# Patient Record
Sex: Female | Born: 1985
Health system: Southern US, Community
[De-identification: ages and names within clinical notes are randomized; demographics above are authoritative.]

## PROBLEM LIST (undated history)

## (undated) DIAGNOSIS — N809 Endometriosis, unspecified: Secondary | ICD-10-CM

## (undated) DIAGNOSIS — F32A Depression, unspecified: Secondary | ICD-10-CM

## (undated) DIAGNOSIS — F329 Major depressive disorder, single episode, unspecified: Secondary | ICD-10-CM

## (undated) DIAGNOSIS — K900 Celiac disease: Secondary | ICD-10-CM

## (undated) DIAGNOSIS — J45909 Unspecified asthma, uncomplicated: Secondary | ICD-10-CM

---

## 2014-09-28 HISTORY — PX: IVC FILTER INSERTION: CATH118245

## 2014-09-28 HISTORY — PX: GASTRIC BYPASS OPEN: SUR638

## 2018-04-28 ENCOUNTER — Emergency Department (HOSPITAL_COMMUNITY): Payer: Self-pay

## 2018-04-28 ENCOUNTER — Emergency Department (HOSPITAL_COMMUNITY)
Admission: EM | Admit: 2018-04-28 | Discharge: 2018-04-28 | Disposition: A | Discharge status: Home / Self Care | Payer: Self-pay | Attending: Emergency Medicine | Admitting: Emergency Medicine

## 2018-04-28 ENCOUNTER — Other Ambulatory Visit: Payer: Self-pay

## 2018-04-28 ENCOUNTER — Encounter (HOSPITAL_COMMUNITY): Payer: Self-pay

## 2018-04-28 DIAGNOSIS — F329 Major depressive disorder, single episode, unspecified: Secondary | ICD-10-CM | POA: Insufficient documentation

## 2018-04-28 DIAGNOSIS — M545 Low back pain, unspecified: Secondary | ICD-10-CM

## 2018-04-28 DIAGNOSIS — Z79899 Other long term (current) drug therapy: Secondary | ICD-10-CM | POA: Insufficient documentation

## 2018-04-28 DIAGNOSIS — F17200 Nicotine dependence, unspecified, uncomplicated: Secondary | ICD-10-CM | POA: Insufficient documentation

## 2018-04-28 DIAGNOSIS — G8929 Other chronic pain: Secondary | ICD-10-CM | POA: Insufficient documentation

## 2018-04-28 DIAGNOSIS — Z9884 Bariatric surgery status: Secondary | ICD-10-CM | POA: Insufficient documentation

## 2018-04-28 DIAGNOSIS — J45909 Unspecified asthma, uncomplicated: Secondary | ICD-10-CM | POA: Insufficient documentation

## 2018-04-28 HISTORY — DX: Celiac disease: K90.0

## 2018-04-28 HISTORY — DX: Endometriosis, unspecified: N80.9

## 2018-04-28 HISTORY — DX: Unspecified asthma, uncomplicated: J45.909

## 2018-04-28 HISTORY — DX: Depression, unspecified: F32.A

## 2018-04-28 HISTORY — DX: Major depressive disorder, single episode, unspecified: F32.9

## 2018-04-28 LAB — PREGNANCY, URINE: Preg Test, Ur: NEGATIVE

## 2018-04-28 MED ORDER — OXYCODONE-ACETAMINOPHEN 5-325 MG PO TABS
1.0000 | ORAL_TABLET | Freq: Once | ORAL | Status: AC
  Administered 2018-04-28: 1 via ORAL
  Filled 2018-04-28: qty 1

## 2018-04-28 MED ORDER — PREDNISONE 20 MG PO TABS: 40.0000 mg | ORAL_TABLET | Freq: Every day | ORAL | 0 refills | Status: AC

## 2018-04-28 MED ORDER — HYDROCODONE-ACETAMINOPHEN 5-325 MG PO TABS: 1.0000 | ORAL_TABLET | Freq: Four times a day (QID) | ORAL | 0 refills | Status: DC | PRN

## 2018-04-28 MED ORDER — PREDNISONE 50 MG PO TABS
50.0000 mg | ORAL_TABLET | Freq: Once | ORAL | Status: AC
  Administered 2018-04-28: 50 mg via ORAL
  Filled 2018-04-28: qty 1

## 2018-04-28 MED ORDER — TIZANIDINE HCL 4 MG PO TABS: 4.0000 mg | ORAL_TABLET | Freq: Three times a day (TID) | ORAL | 0 refills | Status: DC | PRN

## 2018-04-28 MED ORDER — KETOROLAC TROMETHAMINE 30 MG/ML IJ SOLN
30.0000 mg | Freq: Once | INTRAMUSCULAR | Status: AC
  Administered 2018-04-28: 30 mg via INTRAMUSCULAR
  Filled 2018-04-28: qty 1

## 2018-04-28 NOTE — Discharge Instructions (Addendum)
Please call and make an appointment to establish care with a primary doctor.  I have listed information to [facility] below, they are located across the street from [facility] and except patients who do not have insurance.  I have also listed the information to the neurosurgeon office, please make an appointment as needed.  I have written a prescription for several medications.  You can take Norco every 6 hours as needed, be aware that this medicine is a narcotic and can make you feel very drowsy.  Do not drive, work or drink alcohol while taking it. I have also prescribed you muscle relaxer and prednisone.  You received your first dose of prednisone in the ER and do not need to take another dose until tomorrow.  Take 800 mg ibuprofen every 6 hours as needed for pain.   You can also apply heat to the lower back to help with your symptoms.  Return to the ER if you have any new or concerning symptoms like numbness in which you cannot feel your feet or legs, weakness making it difficult to walk, loss of bowel or bladder control or fever.

## 2018-04-28 NOTE — ED Provider Notes (Signed)
[place] DEPT Provider Note   CSN: [id number] Arrival date & time: 04/28/18  1127     History   Chief Complaint No chief complaint on file.   HPI Rebecca Velasquez is a 32 y.o. female.  HPI   Rebecca Velasquez is a 32 year old female with a history of asthma, endometriosis and depression who presents to the emergency department for evaluation of lower back pain.  Patient reports that she has been having intermittent lower back pain for the past 4 weeks now.  She recently moved from [place] and was lifting heavy boxes which she believes contributed to her symptoms.  She also works as a Development worker, community and believes that the dogs pulling on the leash exacerbate her symptoms.  She states that over the past 2 days her symptoms have become worse and persistent.  Pain is severe and constant.  She states that there are 2 types of pain, one in the middle of her lower back which is sharp and another on the right side of her back which radiates to the right lateral thigh and feels aching.  She reports pain is worsened with movement including bending at the hip and twisting motion.  Denies recent trauma or fall.  Last night she was up all night crying in pain.  She is taking ibuprofen without significant relief.  Denies previous back surgery.  Denies personal history of cancer or IV drug use.  She denies fevers, chills, night sweats, unexpected weight changes, numbness, weakness, saddle anesthesia, loss of bowel or bladder control, abdominal pain, nausea/vomiting, dysuria, urinary frequency.  She is able to ambulate independently despite pain.  Past Medical History:  Diagnosis Date  . Asthma   . Celiac disease   . Depression   . Endometriosis     There are no active problems to display for this patient.   Past Surgical History:  Procedure Laterality Date  . GASTRIC BYPASS OPEN  2016  . IVC FILTER INSERTION  2016     OB History   None      Home Medications     Prior to Admission medications   Medication Sig Start Date End Date Taking? Authorizing Provider  DULoxetine (CYMBALTA) 60 MG capsule Take 60 mg by mouth daily.   Yes [provider]  gabapentin (NEURONTIN) 300 MG capsule Take 300 mg by mouth 2 (two) times daily as needed (back pain).   Yes [provider]  ibuprofen (ADVIL,MOTRIN) 200 MG tablet Take 800 mg by mouth every 6 (six) hours as needed (back pain).   Yes [provider]  methocarbamol (ROBAXIN) 750 MG tablet Take 750-1,500 mg by mouth every 8 (eight) hours as needed for muscle spasms (back pain).   Yes [provider]  montelukast (SINGULAIR) 10 MG tablet Take 10 mg by mouth at bedtime.   Yes [provider]  omeprazole (PRILOSEC) 40 MG capsule Take 40 mg by mouth daily.   Yes [provider]  oxyCODONE-acetaminophen (PERCOCET) 7.5-325 MG tablet Take 1 tablet by mouth at bedtime as needed for moderate pain or severe pain.   Yes [provider]  QUEtiapine (SEROQUEL) 200 MG tablet Take 200 mg by mouth at bedtime.   Yes [provider]    Family History No family history on file.  Social History Social History   Tobacco Use  . Smoking status: Current Every Day Smoker    Packs/day: 0.50  . Smokeless tobacco: Never Used  Substance Use Topics  .  Alcohol use: Never    Frequency: Never  . Drug use: Never     Allergies   Patient has no known allergies.   Review of Systems Review of Systems  Constitutional: Negative for chills, diaphoresis, fever and unexpected weight change.  Gastrointestinal: Negative for abdominal pain, diarrhea, nausea and vomiting.  Genitourinary: Negative for difficulty urinating, dysuria and frequency.  Musculoskeletal: Positive for back pain. Negative for neck pain.  Skin: Negative for rash.  Neurological: Negative for weakness and numbness.     Physical Exam Updated Vital Signs BP 124/74 (BP Location: Left Arm)   Pulse  66   Temp 97.9 F (36.6 C) (Oral)   Resp 17   Ht 5' 1"  (1.549 m)   Wt 113.4 kg (250 lb)   LMP 04/21/2018   SpO2 97%   BMI 47.24 kg/m   Physical Exam  Constitutional: She is oriented to person, place, and time. She appears well-developed and well-nourished. No distress.  HENT:  Head: Normocephalic and atraumatic.  Eyes: Right eye exhibits no discharge. Left eye exhibits no discharge.  Pulmonary/Chest: Effort normal. No respiratory distress.  Abdominal: Soft. Bowel sounds are normal. There is no tenderness. There is no guarding.  No CVA tenderness.   Musculoskeletal:  Tender to palpation over several spinous processes of the lumbar spine as well as right-sided paraspinal muscles and right SI joint.  Strength 5/5 in bilateral knee flexion/extension.  DP pulses 1+ and symmetric bilaterally. Negative straight leg raise.   Neurological: She is alert and oriented to person, place, and time. Coordination normal.  Patellar reflex 1+ and symmetric bilaterally.  Sensation to light touch intact in bilateral lower extremities.  Gait normal and coordination and balance.  Skin: Skin is warm and dry. Capillary refill takes less than 2 seconds. She is not diaphoretic.  Psychiatric: She has a normal mood and affect. Her behavior is normal.  Nursing note and vitals reviewed.    ED Treatments / Results  Labs (all labs ordered are listed, but only abnormal results are displayed) Labs Reviewed  PREGNANCY, URINE    EKG None  Radiology Dg Lumbar Spine Complete  Result Date: 04/28/2018 CLINICAL DATA:  32 year old female with increasing lumbar back pain for 4 weeks without known injury. EXAM: LUMBAR SPINE - COMPLETE 4+ VIEW COMPARISON:  None. FINDINGS: IVC filter in place, projecting to the right of the mid lumbar spine. For the purposes of this report, hypoplastic ribs are designated at L1 with full size ribs at T12. A vestigial S1-S2 disc space is suspected. Normal vertebral height and alignment.  Relatively preserved disc spaces. No pars fracture. The sacral ala and SI joints appear normal. Visible lower thoracic levels appear intact. Left upper quadrant surgical clips. Negative visible bowel gas pattern. IMPRESSION: 1. Transitional lumbar anatomy, but otherwise unremarkable radiographic appearance of the lumbar spine. 2. IVC filter in place. Electronically Signed   By: Genevie Ann M.D.   On: 04/28/2018 14:52    Procedures Procedures (including critical care time)  Medications Ordered in ED Medications  oxyCODONE-acetaminophen (PERCOCET/ROXICET) 5-325 MG per tablet 1 tablet (1 tablet Oral Given 04/28/18 1310)  ketorolac (TORADOL) 30 MG/ML injection 30 mg (30 mg Intramuscular Given 04/28/18 1310)  predniSONE (DELTASONE) tablet 50 mg (50 mg Oral Given 04/28/18 1310)     Initial Impression / Assessment and Plan / ED Course  I have reviewed the triage vital signs and the nursing notes.  Pertinent labs & imaging results that were available during my care of the patient  were reviewed by me and considered in my medical decision making (see chart for details).     Patient with lower back pain radiating to the right side over the past 4 weeks.  Pain is aggravated by movement.  No neurological deficits and normal neuro exam.  Patient can walk but states is painful. No loss of bowel or bladder control. No concern for cauda equina.  Lumbar spine x-ray ordered given back pain >4 weeks.  Lumbar spine x-ray negative for acute fracture or abnormality. No fever or history of IVDU and I do not suspect diskitis, osteomyelitis of the spine or epidural abscess. No weight loss or h/o cancer.  Plan to discharge home with steroid and muscle relaxer.  Patient is requesting something stronger and I will go ahead and treat with short course of Norco.  I did look her up in the database prior to prescribing this medicine.  Will also give her information to follow-up with a PCP and neurosurgery as needed.  I have counseled her  on return precautions and she agrees and voiced understanding to the above plan and appears reliable for follow-up.    Final Clinical Impressions(s) / ED Diagnoses   Final diagnoses:  Chronic midline low back pain without sciatica    ED Discharge Orders        Ordered    HYDROcodone-acetaminophen (NORCO/VICODIN) 5-325 MG tablet  Every 6 hours PRN     04/28/18 1519    tiZANidine (ZANAFLEX) 4 MG tablet  Every 8 hours PRN     04/28/18 1519    predniSONE (DELTASONE) 20 MG tablet  Daily     04/28/18 1519       Glyn Ade, PA-C 04/28/18 1519    Lajean Saver, MD 04/29/18 [phone]

## 2018-04-28 NOTE — ED Triage Notes (Signed)
Pt states back pain x 2 days. Pt recently moved from [place]. Pt was having back pain prior to the move, but it has exacerbated. Pt states pain on right lower back and in the lumbar region.

## 2019-05-05 ENCOUNTER — Ambulatory Visit (HOSPITAL_COMMUNITY)
Admission: EM | Admit: 2019-05-05 | Discharge: 2019-05-05 | Disposition: A | Discharge status: Home / Self Care | Payer: Self-pay | Attending: Family Medicine | Admitting: Family Medicine

## 2019-05-05 ENCOUNTER — Encounter (HOSPITAL_COMMUNITY): Payer: Self-pay

## 2019-05-05 ENCOUNTER — Other Ambulatory Visit: Payer: Self-pay

## 2019-05-05 DIAGNOSIS — J069 Acute upper respiratory infection, unspecified: Secondary | ICD-10-CM

## 2019-05-05 DIAGNOSIS — R058 Other specified cough: Secondary | ICD-10-CM

## 2019-05-05 DIAGNOSIS — R05 Cough: Secondary | ICD-10-CM

## 2019-05-05 MED ORDER — PREDNISONE 20 MG PO TABS: ORAL_TABLET | ORAL | 0 refills | Status: DC

## 2019-05-05 MED ORDER — BENZONATATE 200 MG PO CAPS: 200.0000 mg | ORAL_CAPSULE | Freq: Two times a day (BID) | ORAL | 0 refills | Status: DC | PRN

## 2019-05-05 MED ORDER — ALBUTEROL SULFATE HFA 108 (90 BASE) MCG/ACT IN AERS: 1.0000 | INHALATION_SPRAY | Freq: Four times a day (QID) | RESPIRATORY_TRACT | 0 refills | Status: DC | PRN

## 2019-05-05 NOTE — ED Triage Notes (Signed)
Patient presents to Urgent Care with complaints of cough and nasal congestion/drainage since 2 weeks ago. Patient reports she went to an urgent care at date of onset, was given abx and steroids which helped some. Also tested negative for COVID.

## 2019-05-05 NOTE — ED Provider Notes (Signed)
[place]    CSN: [id number] Arrival date & time: 05/05/19  1000      History   Chief Complaint Chief Complaint  Patient presents with  . Appointment    10:10  . Nasal Congestion  . Cough    HPI Rebecca Velasquez is a 33 y.o. female.   HPI Patient has underlying asthma.  She had a bad cough about a month ago.  She was treated with Augmentin and prednisone.  Felt briefly better on the prednisone.  Continues to have coughing.  She had a negative cover test.  She has been using her albuterol more than usual.  She is here for persistent cough.  Now she has some nasal congestion drainage.  She thinks it may be some underlying allergies.  She would like a refill of her albuterol. Past Medical History:  Diagnosis Date  . Asthma   . Celiac disease   . Depression   . Endometriosis     There are no active problems to display for this patient.   Past Surgical History:  Procedure Laterality Date  . GASTRIC BYPASS OPEN  2016  . IVC FILTER INSERTION  2016    OB History   No obstetric history on file.      Home Medications    Prior to Admission medications   Medication Sig Start Date End Date Taking? Authorizing Provider  ibuprofen (ADVIL,MOTRIN) 200 MG tablet Take 800 mg by mouth every 6 (six) hours as needed (back pain).   Yes [provider]  montelukast (SINGULAIR) 10 MG tablet Take 10 mg by mouth at bedtime.   Yes [provider]  omeprazole (PRILOSEC) 40 MG capsule Take 40 mg by mouth daily.   Yes [provider]  traZODone (DESYREL) 100 MG tablet Take 100 mg by mouth at bedtime.   Yes [provider]  vortioxetine HBr (TRINTELLIX) 10 MG TABS tablet Take 10 mg by mouth daily.   Yes [provider]  albuterol (VENTOLIN HFA) 108 (90 Base) MCG/ACT inhaler Inhale 1-2 puffs into the lungs every 6 (six) hours as needed for wheezing or shortness of breath. 05/05/19   Raylene Everts, MD  benzonatate (TESSALON) 200 MG  capsule Take 1 capsule (200 mg total) by mouth 2 (two) times daily as needed for cough. 05/05/19   Raylene Everts, MD  predniSONE (DELTASONE) 20 MG tablet Take one twice a day for 5 days then once a day for a week 05/05/19   Raylene Everts, MD  DULoxetine (CYMBALTA) 60 MG capsule Take 60 mg by mouth daily.  05/05/19  [provider]  gabapentin (NEURONTIN) 300 MG capsule Take 300 mg by mouth 2 (two) times daily as needed (back pain).  05/05/19  [provider]  QUEtiapine (SEROQUEL) 200 MG tablet Take 200 mg by mouth at bedtime.  05/05/19  [provider]    Family History Family History  Problem Relation Age of Onset  . Rheum arthritis Mother   . Cancer Mother   . Diabetes Father   . Cancer Father     Social History Social History   Tobacco Use  . Smoking status: Current Every Day Smoker    Packs/day: 0.50  . Smokeless tobacco: Never Used  Substance Use Topics  . Alcohol use: Never    Frequency: Never  . Drug use: Never     Allergies   Patient has no known allergies.   Review of Systems Review of Systems  Constitutional: Negative  for chills and fever.  HENT: Positive for congestion. Negative for ear pain and sore throat.   Eyes: Negative for pain and visual disturbance.  Respiratory: Positive for cough, shortness of breath and wheezing.   Cardiovascular: Negative for chest pain and palpitations.  Gastrointestinal: Negative for abdominal pain and vomiting.  Genitourinary: Negative for dysuria and hematuria.  Musculoskeletal: Negative for arthralgias and back pain.  Skin: Negative for color change and rash.  Neurological: Negative for seizures and syncope.  All other systems reviewed and are negative.    Physical Exam Triage Vital Signs ED Triage Vitals  Enc Vitals Group     BP 05/05/19 1054 (!) 127/97     Pulse Rate 05/05/19 1054 93     Resp 05/05/19 1054 17     Temp 05/05/19 1054 97.8 F (36.6 C)     Temp Source 05/05/19 1054 Oral      SpO2 05/05/19 1054 100 %     Weight --      Height --      Head Circumference --      Peak Flow --      Pain Score 05/05/19 1050 0     Pain Loc --      Pain Edu? --      Excl. in [place]? --    No data found.  Updated Vital Signs BP (!) 127/97 (BP Location: Right Arm)   Pulse 93   Temp 97.8 F (36.6 C) (Oral)   Resp 17   SpO2 100%   Visual Acuity Right Eye Distance:   Left Eye Distance:   Bilateral Distance:    Right Eye Near:   Left Eye Near:    Bilateral Near:     Physical Exam Constitutional:      General: She is not in acute distress.    Appearance: She is well-developed. She is obese. She is not ill-appearing.  HENT:     Head: Normocephalic and atraumatic.     Right Ear: Tympanic membrane, ear canal and external ear normal.     Left Ear: Tympanic membrane, ear canal and external ear normal.     Nose: Congestion present.     Mouth/Throat:     Mouth: Mucous membranes are moist.     Pharynx: No posterior oropharyngeal erythema.  Eyes:     Conjunctiva/sclera: Conjunctivae normal.     Pupils: Pupils are equal, round, and reactive to light.  Neck:     Musculoskeletal: Normal range of motion.  Cardiovascular:     Rate and Rhythm: Normal rate and regular rhythm.     Heart sounds: Normal heart sounds.  Pulmonary:     Effort: Pulmonary effort is normal. No respiratory distress.     Breath sounds: Normal breath sounds. No wheezing or rales.  Abdominal:     General: There is no distension.     Palpations: Abdomen is soft.  Musculoskeletal: Normal range of motion.  Lymphadenopathy:     Cervical: No cervical adenopathy.  Skin:    General: Skin is warm and dry.  Neurological:     Mental Status: She is alert.  Psychiatric:        Behavior: Behavior normal.      UC Treatments / Results  Labs (all labs ordered are listed, but only abnormal results are displayed) Labs Reviewed - No data to display  EKG   Radiology No results found.  Procedures  Procedures (including critical care time)  Medications Ordered in UC Medications - No data  to display  Initial Impression / Assessment and Plan / UC Course  I have reviewed the triage vital signs and the nursing notes.  Pertinent labs & imaging results that were available during my care of the patient were reviewed by me and considered in my medical decision making (see chart for details).     Discussed that after a viral bronchitis you can sometimes end up with a bronchial inflammation that persists, with coughing that can last for 4 to 6 weeks well.  We will treat the cough with Tessalon and Delsym.  1 more course of prednisone.  Continue albuterol.  Expect improvement. Final Clinical Impressions(s) / UC Diagnoses   Final diagnoses:  Post-viral cough syndrome  Viral URI with cough     Discharge Instructions     Drink plenty of fluids Use a humidifier if you have one Take the Tessalon with a long-acting DM product like Delsym or Mucinex DM every 12 hours Take the prednisone as directed Continue albuterol as needed , expect improvement in a few days   ED Prescriptions    Medication Sig Dispense Auth. Provider   benzonatate (TESSALON) 200 MG capsule Take 1 capsule (200 mg total) by mouth 2 (two) times daily as needed for cough. 20 capsule Raylene Everts, MD   predniSONE (DELTASONE) 20 MG tablet Take one twice a day for 5 days then once a day for a week 17 tablet Raylene Everts, MD   albuterol (VENTOLIN HFA) 108 (90 Base) MCG/ACT inhaler Inhale 1-2 puffs into the lungs every 6 (six) hours as needed for wheezing or shortness of breath. 18 g Raylene Everts, MD     Controlled Substance Prescriptions [place] Controlled Substance Registry consulted? Not Applicable   Raylene Everts, MD 05/05/19 [phone]

## 2019-05-05 NOTE — Discharge Instructions (Signed)
Drink plenty of fluids Use a humidifier if you have one Take the Tessalon with a long-acting DM product like Delsym or Mucinex DM every 12 hours Take the prednisone as directed Continue albuterol as needed , expect improvement in a few days

## 2019-05-08 ENCOUNTER — Encounter (HOSPITAL_COMMUNITY): Payer: Self-pay | Admitting: Emergency Medicine

## 2019-05-08 ENCOUNTER — Other Ambulatory Visit: Payer: Self-pay

## 2019-05-08 ENCOUNTER — Ambulatory Visit (HOSPITAL_COMMUNITY)
Admission: EM | Admit: 2019-05-08 | Discharge: 2019-05-08 | Disposition: A | Discharge status: Home / Self Care | Payer: HRSA Program | Attending: Internal Medicine | Admitting: Internal Medicine

## 2019-05-08 DIAGNOSIS — R0981 Nasal congestion: Secondary | ICD-10-CM | POA: Insufficient documentation

## 2019-05-08 DIAGNOSIS — Z9884 Bariatric surgery status: Secondary | ICD-10-CM | POA: Insufficient documentation

## 2019-05-08 DIAGNOSIS — Z809 Family history of malignant neoplasm, unspecified: Secondary | ICD-10-CM | POA: Diagnosis not present

## 2019-05-08 DIAGNOSIS — Z20828 Contact with and (suspected) exposure to other viral communicable diseases: Secondary | ICD-10-CM | POA: Diagnosis not present

## 2019-05-08 DIAGNOSIS — Z79899 Other long term (current) drug therapy: Secondary | ICD-10-CM | POA: Insufficient documentation

## 2019-05-08 DIAGNOSIS — R059 Cough, unspecified: Secondary | ICD-10-CM

## 2019-05-08 DIAGNOSIS — F329 Major depressive disorder, single episode, unspecified: Secondary | ICD-10-CM | POA: Insufficient documentation

## 2019-05-08 DIAGNOSIS — Z833 Family history of diabetes mellitus: Secondary | ICD-10-CM | POA: Diagnosis not present

## 2019-05-08 DIAGNOSIS — J45909 Unspecified asthma, uncomplicated: Secondary | ICD-10-CM | POA: Insufficient documentation

## 2019-05-08 DIAGNOSIS — F17210 Nicotine dependence, cigarettes, uncomplicated: Secondary | ICD-10-CM | POA: Insufficient documentation

## 2019-05-08 DIAGNOSIS — R05 Cough: Secondary | ICD-10-CM | POA: Insufficient documentation

## 2019-05-08 DIAGNOSIS — R6889 Other general symptoms and signs: Secondary | ICD-10-CM | POA: Diagnosis present

## 2019-05-08 DIAGNOSIS — Z20822 Contact with and (suspected) exposure to covid-19: Secondary | ICD-10-CM

## 2019-05-08 NOTE — ED Provider Notes (Signed)
[place]    CSN: [id number] Arrival date & time: 05/08/19  1635     History   Chief Complaint Chief Complaint  Patient presents with  . Appointment    appt 4:30  . Cough    HPI Rebecca Velasquez is a 33 y.o. female.   Patient presents with nonproductive cough x1 month.  She was treated with Augmentin and prednisone; her symptoms improved but turned once she was off the medication.  She states she had a COVID test done at that time which was negative.  She reports ongoing persistent dry cough and intermittent nasal congestion.  Patient was seen again on 05/05/2019 here and treated with prednisone, albuterol inhaler, and Tessalon.  She denies fever, chills, sore throat, ear pain, shortness of breath, vomiting, diarrhea, or other symptoms.  The history is provided by the patient.    Past Medical History:  Diagnosis Date  . Asthma   . Celiac disease   . Depression   . Endometriosis     There are no active problems to display for this patient.   Past Surgical History:  Procedure Laterality Date  . GASTRIC BYPASS OPEN  2016  . IVC FILTER INSERTION  2016    OB History   No obstetric history on file.      Home Medications    Prior to Admission medications   Medication Sig Start Date End Date Taking? Authorizing Provider  albuterol (VENTOLIN HFA) 108 (90 Base) MCG/ACT inhaler Inhale 1-2 puffs into the lungs every 6 (six) hours as needed for wheezing or shortness of breath. 05/05/19   Raylene Everts, MD  benzonatate (TESSALON) 200 MG capsule Take 1 capsule (200 mg total) by mouth 2 (two) times daily as needed for cough. 05/05/19   Raylene Everts, MD  ibuprofen (ADVIL,MOTRIN) 200 MG tablet Take 800 mg by mouth every 6 (six) hours as needed (back pain).    [provider]  montelukast (SINGULAIR) 10 MG tablet Take 10 mg by mouth at bedtime.    [provider]  omeprazole (PRILOSEC) 40 MG capsule Take 40 mg by mouth daily.    [provider]  predniSONE (DELTASONE) 20 MG tablet Take one twice a day for 5 days then once a day for a week 05/05/19   Raylene Everts, MD  traZODone (DESYREL) 100 MG tablet Take 100 mg by mouth at bedtime.    [provider]  vortioxetine HBr (TRINTELLIX) 10 MG TABS tablet Take 10 mg by mouth daily.    [provider]  DULoxetine (CYMBALTA) 60 MG capsule Take 60 mg by mouth daily.  05/05/19  [provider]  gabapentin (NEURONTIN) 300 MG capsule Take 300 mg by mouth 2 (two) times daily as needed (back pain).  05/05/19  [provider]  QUEtiapine (SEROQUEL) 200 MG tablet Take 200 mg by mouth at bedtime.  05/05/19  [provider]    Family History Family History  Problem Relation Age of Onset  . Rheum arthritis Mother   . Cancer Mother   . Diabetes Father   . Cancer Father     Social History Social History   Tobacco Use  . Smoking status: Current Every Day Smoker    Packs/day: 0.50  . Smokeless tobacco: Never Used  Substance Use Topics  . Alcohol use: Never    Frequency: Never  . Drug use: Never     Allergies   Patient has no known allergies.   Review of  Systems Review of Systems  Constitutional: Negative for chills and fever.  HENT: Positive for congestion. Negative for ear pain and sore throat.   Eyes: Negative for pain and visual disturbance.  Respiratory: Positive for cough. Negative for shortness of breath.   Cardiovascular: Negative for chest pain and palpitations.  Gastrointestinal: Negative for abdominal pain and vomiting.  Genitourinary: Negative for dysuria and hematuria.  Musculoskeletal: Negative for arthralgias and back pain.  Skin: Negative for color change and rash.  Neurological: Negative for seizures and syncope.  All other systems reviewed and are negative.    Physical Exam Triage Vital Signs ED Triage Vitals  Enc Vitals Group     BP 05/08/19 1658 105/65     Pulse Rate 05/08/19 1658 86     Resp  05/08/19 1658 20     Temp 05/08/19 1658 97.8 F (36.6 C)     Temp Source 05/08/19 1658 Temporal     SpO2 05/08/19 1658 99 %     Weight --      Height --      Head Circumference --      Peak Flow --      Pain Score 05/08/19 1709 3     Pain Loc --      Pain Edu? --      Excl. in [place]? --    No data found.  Updated Vital Signs BP 105/65 (BP Location: Right Arm)   Pulse 86   Temp 97.8 F (36.6 C) (Temporal)   Resp 20   SpO2 99%   Visual Acuity Right Eye Distance:   Left Eye Distance:   Bilateral Distance:    Right Eye Near:   Left Eye Near:    Bilateral Near:     Physical Exam Vitals signs and nursing note reviewed.  Constitutional:      General: She is not in acute distress.    Appearance: She is well-developed.  HENT:     Head: Normocephalic and atraumatic.     Right Ear: Tympanic membrane normal.     Left Ear: Tympanic membrane normal.     Nose: Congestion present.     Mouth/Throat:     Mouth: Mucous membranes are moist.     Pharynx: Oropharynx is clear.  Eyes:     Conjunctiva/sclera: Conjunctivae normal.  Neck:     Musculoskeletal: Neck supple.  Cardiovascular:     Rate and Rhythm: Normal rate and regular rhythm.     Heart sounds: No murmur.  Pulmonary:     Effort: Pulmonary effort is normal. No respiratory distress.     Breath sounds: Normal breath sounds.  Abdominal:     Palpations: Abdomen is soft.     Tenderness: There is no abdominal tenderness. There is no guarding or rebound.  Skin:    General: Skin is warm and dry.     Findings: No rash.  Neurological:     Mental Status: She is alert.      UC Treatments / Results  Labs (all labs ordered are listed, but only abnormal results are displayed) Labs Reviewed - No data to display  EKG   Radiology No results found.  Procedures Procedures (including critical care time)  Medications Ordered in UC Medications - No data to display  Initial Impression / Assessment and Plan / UC Course  I  have reviewed the triage vital signs and the nursing notes.  Pertinent labs & imaging results that were available during my care of the patient were  reviewed by me and considered in my medical decision making (see chart for details).   Cough, suspected COVID.  COVID test performed here.  Discussed with patient that she should self quarantine until her test result is back and negative.  Instructed patient to go to the emergency department if she develops shortness of breath, high fever, severe diarrhea, or other concerning symptoms.  Instructed patient to continue taking the medications prescribed on her previous visit.     Final Clinical Impressions(s) / UC Diagnoses   Final diagnoses:  Cough  Suspected Covid-19 Virus Infection     Discharge Instructions     Your COVID test is pending.  You should self quarantine until your test result is back and is negative.    Go to the emergency department if you develop shortness of breath, high fever, severe diarrhea, or other concerning symptoms.         ED Prescriptions    None     Controlled Substance Prescriptions [place] Controlled Substance Registry consulted? Not Applicable   Sharion Balloon, NP 05/08/19 1821

## 2019-05-08 NOTE — ED Triage Notes (Signed)
Pt here for cough and asthma sx; pt seen here three days ago for same and taking meds; pt sts no improvement; pt asking about second covid test; obtained

## 2019-05-08 NOTE — Discharge Instructions (Addendum)
Your COVID test is pending.  You should self quarantine until your test result is back and is negative.    Go to the emergency department if you develop shortness of breath, high fever, severe diarrhea, or other concerning symptoms.

## 2019-05-10 LAB — NOVEL CORONAVIRUS, NAA (HOSP ORDER, SEND-OUT TO REF LAB; TAT 18-24 HRS): SARS-CoV-2, NAA: NOT DETECTED

## 2019-05-24 ENCOUNTER — Encounter

## 2019-05-24 ENCOUNTER — Ambulatory Visit: Payer: Self-pay | Admitting: Family Medicine

## 2019-05-24 DIAGNOSIS — Z0289 Encounter for other administrative examinations: Secondary | ICD-10-CM

## 2019-05-31 ENCOUNTER — Encounter: Payer: Self-pay | Admitting: Family Medicine

## 2019-08-30 ENCOUNTER — Emergency Department (HOSPITAL_COMMUNITY): Payer: Self-pay

## 2019-08-30 ENCOUNTER — Encounter (HOSPITAL_COMMUNITY): Payer: Self-pay

## 2019-08-30 ENCOUNTER — Emergency Department (HOSPITAL_COMMUNITY)
Admission: EM | Admit: 2019-08-30 | Discharge: 2019-08-30 | Disposition: A | Discharge status: Home / Self Care | Payer: Self-pay | Attending: Emergency Medicine | Admitting: Emergency Medicine

## 2019-08-30 ENCOUNTER — Other Ambulatory Visit: Payer: Self-pay

## 2019-08-30 DIAGNOSIS — R1031 Right lower quadrant pain: Secondary | ICD-10-CM | POA: Insufficient documentation

## 2019-08-30 LAB — URINALYSIS, ROUTINE W REFLEX MICROSCOPIC
Bilirubin Urine: NEGATIVE
Glucose, UA: NEGATIVE mg/dL
Hgb urine dipstick: NEGATIVE
Ketones, ur: NEGATIVE mg/dL
Leukocytes,Ua: NEGATIVE
Nitrite: NEGATIVE
Protein, ur: NEGATIVE mg/dL
Specific Gravity, Urine: 1.02 (ref 1.005–1.030)
pH: 6 (ref 5.0–8.0)

## 2019-08-30 LAB — COMPREHENSIVE METABOLIC PANEL
ALT: 12 U/L (ref 0–44)
AST: 17 U/L (ref 15–41)
Albumin: 3.3 g/dL — ABNORMAL LOW (ref 3.5–5.0)
Alkaline Phosphatase: 73 U/L (ref 38–126)
Anion gap: 9 (ref 5–15)
BUN: 6 mg/dL (ref 6–20)
CO2: 22 mmol/L (ref 22–32)
Calcium: 8.8 mg/dL — ABNORMAL LOW (ref 8.9–10.3)
Chloride: 105 mmol/L (ref 98–111)
Creatinine, Ser: 0.75 mg/dL (ref 0.44–1.00)
GFR calc Af Amer: >60 mL/min (ref >60)
GFR calc non Af Amer: >60 mL/min (ref >60)
Glucose, Bld: 94 mg/dL (ref 70–99)
Potassium: 3.9 mmol/L (ref 3.5–5.1)
Sodium: 136 mmol/L (ref 135–145)
Total Bilirubin: 0.2 mg/dL — ABNORMAL LOW (ref 0.3–1.2)
Total Protein: 5.9 g/dL — ABNORMAL LOW (ref 6.5–8.1)

## 2019-08-30 LAB — CBC
HCT: 34.8 % — ABNORMAL LOW (ref 36.0–46.0)
Hemoglobin: 11.2 g/dL — ABNORMAL LOW (ref 12.0–15.0)
MCH: 26.9 pg (ref 26.0–34.0)
MCHC: 32.2 g/dL (ref 30.0–36.0)
MCV: 83.5 fL (ref 80.0–100.0)
Platelets: 282 10*3/uL (ref 150–400)
RBC: 4.17 MIL/uL (ref 3.87–5.11)
RDW: 14.3 % (ref 11.5–15.5)
WBC: 7.1 10*3/uL (ref 4.0–10.5)
nRBC: 0 % (ref 0.0–0.2)

## 2019-08-30 LAB — LIPASE, BLOOD: Lipase: 14 U/L (ref 11–51)

## 2019-08-30 LAB — PREGNANCY, URINE: Preg Test, Ur: NEGATIVE

## 2019-08-30 MED ORDER — DICYCLOMINE HCL 20 MG PO TABS: 20.0000 mg | ORAL_TABLET | Freq: Three times a day (TID) | ORAL | 0 refills | Status: DC

## 2019-08-30 MED ORDER — KETOROLAC TROMETHAMINE 30 MG/ML IJ SOLN
30.0000 mg | Freq: Once | INTRAMUSCULAR | Status: AC
  Administered 2019-08-30: 30 mg via INTRAVENOUS
  Filled 2019-08-30: qty 1

## 2019-08-30 MED ORDER — MORPHINE SULFATE (PF) 4 MG/ML IV SOLN
4.0000 mg | Freq: Once | INTRAVENOUS | Status: AC
  Administered 2019-08-30: 15:00:00 4 mg via INTRAVENOUS
  Filled 2019-08-30: qty 1

## 2019-08-30 MED ORDER — IOHEXOL 300 MG/ML  SOLN
100.0000 mL | Freq: Once | INTRAMUSCULAR | Status: AC | PRN
  Administered 2019-08-30: 100 mL via INTRAVENOUS

## 2019-08-30 MED ORDER — SODIUM CHLORIDE 0.9% FLUSH: 3.0000 mL | Freq: Once | INTRAVENOUS | Status: DC

## 2019-08-30 MED ORDER — ONDANSETRON 4 MG PO TBDP: 4.0000 mg | ORAL_TABLET | Freq: Three times a day (TID) | ORAL | 0 refills | Status: DC | PRN

## 2019-08-30 MED ORDER — MORPHINE SULFATE (PF) 4 MG/ML IV SOLN
4.0000 mg | Freq: Once | INTRAVENOUS | Status: AC
  Administered 2019-08-30: 12:00:00 4 mg via INTRAVENOUS
  Filled 2019-08-30: qty 1

## 2019-08-30 NOTE — ED Provider Notes (Signed)
[facility] Provider Note   CSN: [id number] Arrival date & time: 08/30/19  3875     History   Chief Complaint Chief Complaint  Patient presents with  . Abdominal Pain    HPI Rebecca Velasquez is a 33 y.o. female presents to the ER for evaluation of abdominal pain.  Onset 3 days ago.  Located to the right lower quadrant with some radiation into the middle suprapubic abdomen.  Occasionally she gets shooting pain from her right low back to the front right lower quadrant.  The pain is constant but intermittently worsens.  Currently it is an 8/10.  It is worse if she moves, stands up straight.  Has taken Advil and ibuprofen without relief.  It feels a little bit better if she bends over.  Usually has regular daily bowel movements over the last 2 days however bowel movements have become less frequent.  She had a small BM this morning, still passing gas.  States her last few months she has had urine incontinence with coughing and sneezing but is not sure if this is related.  Has associated nausea.  She has history of endometriosis that usually causes her diffuse lower pelvic cramping the week before her menses.  Her next menstrual period is due in the next 1.5 weeks but states this feels different.  Reports family history of diverticulitis but no personal history of diverticulitis.  History of gastric bypass in 2016 in [place] but no other abdominal surgeries.  Has not been sexually active in a long time.  No history of pelvic infections, STDs, PID.  No known history of uterine fibroids, ovarian cysts.  She is not concerned about an STD and denies any abnormal vaginal discharge, irregular bleeding.  No dysuria, hematuria.  No history of kidney stones.       HPI  Past Medical History:  Diagnosis Date  . Asthma   . Celiac disease   . Depression   . Endometriosis     There are no active problems to display for this patient.   Past Surgical History:   Procedure Laterality Date  . GASTRIC BYPASS OPEN  2016  . IVC FILTER INSERTION  2016     OB History   No obstetric history on file.      Home Medications    Prior to Admission medications   Medication Sig Start Date End Date Taking? Authorizing Provider  albuterol (VENTOLIN HFA) 108 (90 Base) MCG/ACT inhaler Inhale 1-2 puffs into the lungs every 6 (six) hours as needed for wheezing or shortness of breath. 05/05/19  Yes Raylene Everts, MD  cloNIDine (CATAPRES) 0.1 MG tablet Take 0.1 mg by mouth 3 (three) times daily as needed (anxiety).   Yes [provider]  hydrOXYzine (ATARAX/VISTARIL) 10 MG tablet Take 10 mg by mouth 3 (three) times daily as needed for anxiety.   Yes [provider]  ibuprofen (ADVIL,MOTRIN) 200 MG tablet Take 800 mg by mouth every 6 (six) hours as needed (back pain).   Yes [provider]  Omeprazole 20 MG TBEC Take 20 mg by mouth daily as needed (acid reflux).    Yes [provider]  traZODone (DESYREL) 100 MG tablet Take 100 mg by mouth at bedtime.   Yes [provider]  vortioxetine HBr (TRINTELLIX) 10 MG TABS tablet Take 10 mg by mouth daily.   Yes [provider]  dicyclomine (BENTYL) 20 MG tablet Take 1 tablet (20 mg total) by mouth  3 (three) times daily before meals for 7 days. 08/30/19 09/06/19  Kinnie Feil, PA-C  ondansetron (ZOFRAN ODT) 4 MG disintegrating tablet Take 1 tablet (4 mg total) by mouth every 8 (eight) hours as needed for nausea or vomiting. 08/30/19   Kinnie Feil, PA-C  DULoxetine (CYMBALTA) 60 MG capsule Take 60 mg by mouth daily.  05/05/19  [provider]  gabapentin (NEURONTIN) 300 MG capsule Take 300 mg by mouth 2 (two) times daily as needed (back pain).  05/05/19  [provider]  QUEtiapine (SEROQUEL) 200 MG tablet Take 200 mg by mouth at bedtime.  05/05/19  [provider]    Family History Family History  Problem Relation Age of Onset  . Rheum  arthritis Mother   . Cancer Mother   . Diabetes Father   . Cancer Father     Social History Social History   Tobacco Use  . Smoking status: Current Every Day Smoker    Packs/day: 0.50  . Smokeless tobacco: Never Used  Substance Use Topics  . Alcohol use: Never    Frequency: Never  . Drug use: Never     Allergies   Patient has no known allergies.   Review of Systems Review of Systems  Gastrointestinal: Positive for abdominal pain and nausea.  All other systems reviewed and are negative.    Physical Exam Updated Vital Signs BP 133/82   Pulse 66   Temp 98.9 F (37.2 C) (Oral)   Resp 16   SpO2 98%   Physical Exam Vitals signs and nursing note reviewed.  Constitutional:      Appearance: She is well-developed.     Comments: Non toxic in NAD  HENT:     Head: Normocephalic and atraumatic.     Nose: Nose normal.  Eyes:     Conjunctiva/sclera: Conjunctivae normal.  Neck:     Musculoskeletal: Normal range of motion.  Cardiovascular:     Rate and Rhythm: Normal rate and regular rhythm.  Pulmonary:     Effort: Pulmonary effort is normal.     Breath sounds: Normal breath sounds.  Abdominal:     General: Bowel sounds are normal.     Palpations: Abdomen is soft.     Tenderness: There is abdominal tenderness in the right lower quadrant and suprapubic area. Positive signs include McBurney's sign.     Comments: Obese abdomen, soft.  No G/R/R. No suprapubic or CVA tenderness. Negative Murphy's. Unable to auscultate BS to lower quadrants, difficult exam due to body habitus.   Musculoskeletal: Normal range of motion.  Skin:    General: Skin is warm and dry.     Capillary Refill: Capillary refill takes less than 2 seconds.  Neurological:     Mental Status: She is alert.  Psychiatric:        Behavior: Behavior normal.      ED Treatments / Results  Labs (all labs ordered are listed, but only abnormal results are displayed) Labs Reviewed  COMPREHENSIVE METABOLIC  PANEL - Abnormal; Notable for the following components:      Result Value   Calcium 8.8 (*)    Total Protein 5.9 (*)    Albumin 3.3 (*)    Total Bilirubin 0.2 (*)    All other components within normal limits  CBC - Abnormal; Notable for the following components:   Hemoglobin 11.2 (*)    HCT 34.8 (*)    All other components within normal limits  LIPASE, BLOOD  URINALYSIS, ROUTINE  W REFLEX MICROSCOPIC  PREGNANCY, URINE  I-STAT BETA HCG BLOOD, ED ([facility], [facility], [facility] ONLY)    EKG None  Radiology Ct Abdomen Pelvis W Contrast  Result Date: 08/30/2019 CLINICAL DATA:  Right lower quadrant abdominal pain. EXAM: CT ABDOMEN AND PELVIS WITH CONTRAST TECHNIQUE: Multidetector CT imaging of the abdomen and pelvis was performed using the standard protocol following bolus administration of intravenous contrast. CONTRAST:  116m OMNIPAQUE IOHEXOL 300 MG/ML  SOLN COMPARISON:  None. FINDINGS: Lower chest: No acute abnormality. Hepatobiliary: No focal liver abnormality is seen. No gallstones, gallbladder wall thickening, or biliary dilatation. Pancreas: Unremarkable. No pancreatic ductal dilatation or surrounding inflammatory changes. Spleen: Normal in size without focal abnormality. Adrenals/Urinary Tract: 2 mm stone in the mid right kidney. Kidneys are otherwise normal. Adrenal glands are normal. No hydronephrosis. Bladder is normal. Stomach/Bowel: Previous gastric bypass. The bowel otherwise appears normal including the terminal ileum and retrocecal appendix. Vascular/Lymphatic: IVC filter in place. No adenopathy. No significant atherosclerosis. Reproductive: Uterus and bilateral adnexa are unremarkable. Other: No abdominal wall hernia or abnormality. No abdominopelvic ascites. Musculoskeletal: No acute or significant osseous findings. IMPRESSION: Benign-appearing abdomen and pelvis.  Specifically, normal appendix. Electronically Signed   By: JLorriane ShireM.D.   On: 08/30/2019 17:58    Procedures Procedures  (including critical care time)  Medications Ordered in ED Medications  sodium chloride flush (NS) 0.9 % injection 3 mL (has no administration in time range)  morphine 4 MG/ML injection 4 mg (4 mg Intravenous Given 08/30/19 1229)  ketorolac (TORADOL) 30 MG/ML injection 30 mg (30 mg Intravenous Given 08/30/19 1441)  morphine 4 MG/ML injection 4 mg (4 mg Intravenous Given 08/30/19 1442)  iohexol (OMNIPAQUE) 300 MG/ML solution 100 mL (100 mLs Intravenous Contrast Given 08/30/19 1712)     Initial Impression / Assessment and Plan / ED Course  I have reviewed the triage vital signs and the nursing notes.  Pertinent labs & imaging results that were available during my care of the patient were reviewed by me and considered in my medical decision making (see chart for details).  EMR reviewed to obtain pertinent PMH.  Your work-up initiated in triage including urinalysis, reviewed and interpreted by me.  This is vastly reassuring.  No signs of infection, RBCs in the urine.  Negative pregnancy test.  DDX includes constipation related abdominal pain.  Given reassuring urinalysis, no history of kidney stones in the past, urinary symptoms, CVA tenderness I considered UTI, renal stone less likely.  She has no fever, leukocytosis, bloody diarrhea and diverticulitis is less likely.  Discussed with patient likelihood of pelvic process like PID, ovarian torsion, ruptured cyst is possible but less likely given her clinical presentation.  She is overall low risk for PID, STDs.  I offered a pelvic exam as a screening exam but noted this was likely to be low yield, patient agrees and states her pain does not feel "vaginal" and would like to defer pelvic here.  I think this is reasonable.  Given location of abdominal pain however unable to rule out appendicitis.  Will obtain a CT A/P.  13790 CT is vastly reassuring.  Patient reevaluated and reported mild improvement in pain.  No emesis.  Abdomen exam repeated and with  continued diffuse right lower, suprapubic abdominal tenderness but milder.  No peritonitis.  Discussed CT with patient and friend at bedside.  Recommended symptomatic management of her symptoms.  Highest suspicion for constipation related pain, possibly endometriosis pain is her menses is coming in the next  1.5 weeks and she usually has premenstrual cramping.  Discussed likelihood of pelvic etiology like torsion was considered unlikely.  She requested narcotic pain medicines for pain at home but will defer, explained this will only exacerbate constipation and lead to complications.  Recommended high doses of Tylenol, Bentyl, Zofran.  Recommended recheck with PCP in the next 72 hours.  Return precautions discussed.  Final Clinical Impressions(s) / ED Diagnoses   Final diagnoses:  RLQ abdominal pain    ED Discharge Orders         Ordered    dicyclomine (BENTYL) 20 MG tablet  3 times daily before meals     08/30/19 1819    ondansetron (ZOFRAN ODT) 4 MG disintegrating tablet  Every 8 hours PRN     08/30/19 1819           Kinnie Feil, PA-C 08/30/19 1826    Maudie Flakes, MD 09/01/19 1459

## 2019-08-30 NOTE — Discharge Instructions (Signed)
You were seen in the ER for right-sided low abdominal pain.  Lab work, urinalysis and CT scan are all normal.  The cause of your pain at this time is unclear, we discussed some possibilities are slight constipation or possibly endometriosis related pain prior to your cycle.  We will treat your symptoms conservatively.  Take 500 to 1000 mg of acetaminophen every 6 hours for pain.  Take Bentyl which will help with spasms of the gut and with the pain.  Zofran for nausea.  I recommend using a MiraLAX or another stool softener to get your bowel movements moving regularly.  Return to the ER for worsening pain despite medicines, fever, vomiting, changes in your stools, difficulty urinating, abnormal vaginal bleeding.  Follow-up with primary care doctor in the next 72 hours to ensure your symptoms are improving

## 2019-08-30 NOTE — ED Notes (Signed)
Urine pregnancy already collected and resulted.

## 2019-08-30 NOTE — ED Triage Notes (Signed)
Patient complains of lower abdominal pain x 3 days with reported constipation and nausea. No vomiting, denies fever. Denies UTI symptoms

## 2019-10-31 ENCOUNTER — Ambulatory Visit (HOSPITAL_COMMUNITY)
Admission: EM | Admit: 2019-10-31 | Discharge: 2019-10-31 | Disposition: A | Discharge status: Home / Self Care | Payer: Self-pay | Attending: Family Medicine | Admitting: Family Medicine

## 2019-10-31 ENCOUNTER — Other Ambulatory Visit: Payer: Self-pay

## 2019-10-31 ENCOUNTER — Encounter (HOSPITAL_COMMUNITY): Payer: Self-pay

## 2019-10-31 DIAGNOSIS — M5441 Lumbago with sciatica, right side: Secondary | ICD-10-CM

## 2019-10-31 MED ORDER — METHYLPREDNISOLONE 4 MG PO TBPK: ORAL_TABLET | ORAL | 0 refills | Status: DC

## 2019-10-31 MED ORDER — HYDROCODONE-ACETAMINOPHEN 7.5-325 MG PO TABS: 1.0000 | ORAL_TABLET | Freq: Four times a day (QID) | ORAL | 0 refills | Status: DC | PRN

## 2019-10-31 MED ORDER — TIZANIDINE HCL 4 MG PO TABS: 4.0000 mg | ORAL_TABLET | Freq: Four times a day (QID) | ORAL | 0 refills | Status: DC | PRN

## 2019-10-31 NOTE — ED Triage Notes (Signed)
Pt presents with severe lower back pain on right side that radiates down right leg at times X 5 days.

## 2019-10-31 NOTE — Discharge Instructions (Signed)
Activity as tolerated Alternate ice and heat to area Take the Medrol Dosepak as directed.  Take all of day 1 today Take tizanidine as needed, muscle relaxer.  This is useful at bedtime Take Norco as needed for severe pain.  Do not drive on Norco (hydrocodone) Call or return if not improving in a few days For moderate pain take ibuprofen 600 mg every 6 hours with food

## 2019-10-31 NOTE — ED Provider Notes (Signed)
[place]    CSN: [id number] Arrival date & time: 10/31/19  8119      History   Chief Complaint Chief Complaint  Patient presents with   Appointment   (8:30 am BACK PAIN)    HPI Rebecca Velasquez is a 34 y.o. female.   HPI  Patient is here for low back pain.  She states for 4 to 5 days she has had increasing low back pain.  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  She states that she has had this before.  Pain is in her right low back.  Goes down the back of her right leg.  She states this morning she woke up it was "severe".  She could not go to work.  No numbness, no weakness.  No bowel or bladder complaint.  No fall or trauma.  She has had x-rays of her low back in August 2019.  It was normal.  She took some ibuprofen at home.  This is helped moderately.  Past Medical History:  Diagnosis Date   Asthma    Celiac disease    Depression    Endometriosis     There are no problems to display for this patient.   Past Surgical History:  Procedure Laterality Date   GASTRIC BYPASS OPEN  2016   IVC FILTER INSERTION  2016    OB History   No obstetric history on file.      Home Medications    Prior to Admission medications   Medication Sig Start Date End Date Taking? Authorizing Provider  albuterol (VENTOLIN HFA) 108 (90 Base) MCG/ACT inhaler Inhale 1-2 puffs into the lungs every 6 (six) hours as needed for wheezing or shortness of breath. 05/05/19   Raylene Everts, MD  cloNIDine (CATAPRES) 0.1 MG tablet Take 0.1 mg by mouth 3 (three) times daily as needed (anxiety).    [provider]  dicyclomine (BENTYL) 20 MG tablet Take 1 tablet (20 mg total) by mouth 3 (three) times daily before meals for 7 days. 08/30/19 09/06/19  Kinnie Feil, PA-C  HYDROcodone-acetaminophen (NORCO) 7.5-325 MG tablet Take 1 tablet by mouth every 6 (six) hours as needed for moderate pain.  10/31/19   Raylene Everts, MD  hydrOXYzine (ATARAX/VISTARIL) 10 MG tablet Take 10 mg by mouth 3 (three) times daily as needed for anxiety.    [provider]  ibuprofen (ADVIL,MOTRIN) 200 MG tablet Take 800 mg by mouth every 6 (six) hours as needed (back pain).    [provider]  methylPREDNISolone (MEDROL DOSEPAK) 4 MG TBPK tablet tad 10/31/19   Raylene Everts, MD  Omeprazole 20 MG TBEC Take 20 mg by mouth daily as needed (acid reflux).     [provider]  ondansetron (ZOFRAN ODT) 4 MG disintegrating tablet Take 1 tablet (4 mg total) by mouth every 8 (eight) hours as needed for nausea or vomiting. 08/30/19   Kinnie Feil, PA-C  tiZANidine (ZANAFLEX) 4 MG tablet Take 1-2 tablets (4-8 mg total) by mouth every 6 (six) hours as needed for muscle spasms. 10/31/19   Raylene Everts, MD  traZODone (DESYREL) 100 MG tablet Take 100 mg by mouth at bedtime.    [provider]  vortioxetine HBr (TRINTELLIX) 10 MG TABS tablet Take 10 mg by mouth daily.    [provider]  DULoxetine (CYMBALTA) 60 MG capsule  Take 60 mg by mouth daily.  05/05/19  [provider]  gabapentin (NEURONTIN) 300 MG capsule Take 300 mg by mouth 2 (two) times daily as needed (back pain).  05/05/19  [provider]  QUEtiapine (SEROQUEL) 200 MG tablet Take 200 mg by mouth at bedtime.  05/05/19  [provider]    Family History Family History  Problem Relation Age of Onset   Rheum arthritis Mother    Cancer Mother    Diabetes Father    Cancer Father     Social History Social History   Tobacco Use   Smoking status: Current Every Day Smoker    Packs/day: 0.50   Smokeless tobacco: Never Used  Substance Use Topics   Alcohol use: Never   Drug use: Never     Allergies   Patient has no known allergies.   Review of Systems Review of Systems  Gastrointestinal: Negative for constipation, diarrhea, nausea and vomiting.  Genitourinary:  Negative for difficulty urinating.  Musculoskeletal: Positive for back pain and gait problem.     Physical Exam Triage Vital Signs ED Triage Vitals [10/31/19 0900]  Enc Vitals Group     BP (!) 166/84     Pulse Rate 68     Resp 18     Temp 98.3 F (36.8 C)     Temp Source Oral     SpO2 97 %     Weight      Height      Head Circumference      Peak Flow      Pain Score      Pain Loc      Pain Edu?      Excl. in [place]?    No data found.  Updated Vital Signs BP (!) 166/84 (BP Location: Right Arm)    Pulse 68    Temp 98.3 F (36.8 C) (Oral)    Resp 18    LMP 10/23/2019    SpO2 97%   Visual Acuity Right Eye Distance:   Left Eye Distance:   Bilateral Distance:    Right Eye Near:   Left Eye Near:    Bilateral Near:     Physical Exam Constitutional:      General: She is not in acute distress.    Appearance: She is well-developed. She is obese.  HENT:     Head: Normocephalic and atraumatic.     Mouth/Throat:     Comments: Mask in place Eyes:     Conjunctiva/sclera: Conjunctivae normal.     Pupils: Pupils are equal, round, and reactive to light.  Cardiovascular:     Rate and Rhythm: Normal rate.  Pulmonary:     Effort: Pulmonary effort is normal. No respiratory distress.  Abdominal:     General: There is no distension.     Palpations: Abdomen is soft.  Musculoskeletal:        General: Normal range of motion.     Cervical back: Normal range of motion.     Comments: Lumbar spine is straight and symmetric.  Admitted range of motion.  Tenderness and mild increased muscle tone in the right lumbar column of muscles.. Strength, sensation, range of motion, and reflexes are normal in both lower extremities. Straight leg raise is negative bilateral.   Skin:    General: Skin is warm and dry.  Neurological:     General: No focal deficit present.     Mental Status: She is alert.     Motor: No  weakness.  Psychiatric:        Mood and Affect: Mood normal.        Behavior:  Behavior normal.      UC Treatments / Results  Labs (all labs ordered are listed, but only abnormal results are displayed) Labs Reviewed - No data to display  EKG   Radiology No results found.  Procedures Procedures (including critical care time)  Medications Ordered in UC Medications - No data to display  Initial Impression / Assessment and Plan / UC Course  I have reviewed the triage vital signs and the nursing notes.  Pertinent labs & imaging results that were available during my care of the patient were reviewed by me and considered in my medical decision making (see chart for details).     We talked about conservative management of low back pain.  Indications for return Final Clinical Impressions(s) / UC Diagnoses   Final diagnoses:  Acute right-sided low back pain with right-sided sciatica     Discharge Instructions     Activity as tolerated Alternate ice and heat to area Take the Medrol Dosepak as directed.  Take all of day 1 today Take tizanidine as needed, muscle relaxer.  This is useful at bedtime Take Norco as needed for severe pain.  Do not drive on Norco (hydrocodone) Call or return if not improving in a few days For moderate pain take ibuprofen 600 mg every 6 hours with food    ED Prescriptions    Medication Sig Dispense Auth. Provider   methylPREDNISolone (MEDROL DOSEPAK) 4 MG TBPK tablet tad 21 tablet Raylene Everts, MD   tiZANidine (ZANAFLEX) 4 MG tablet Take 1-2 tablets (4-8 mg total) by mouth every 6 (six) hours as needed for muscle spasms. 21 tablet Raylene Everts, MD   HYDROcodone-acetaminophen [facility]) 7.5-325 MG tablet Take 1 tablet by mouth every 6 (six) hours as needed for moderate pain. 15 tablet Raylene Everts, MD     I have reviewed the PDMP during this encounter.   Raylene Everts, MD 10/31/19 [phone]

## 2019-11-05 ENCOUNTER — Ambulatory Visit (INDEPENDENT_AMBULATORY_CARE_PROVIDER_SITE_OTHER): Payer: Self-pay

## 2019-11-05 ENCOUNTER — Other Ambulatory Visit: Payer: Self-pay

## 2019-11-05 ENCOUNTER — Encounter (HOSPITAL_COMMUNITY): Payer: Self-pay

## 2019-11-05 ENCOUNTER — Ambulatory Visit (HOSPITAL_COMMUNITY)
Admission: EM | Admit: 2019-11-05 | Discharge: 2019-11-05 | Disposition: A | Discharge status: Home / Self Care | Payer: Self-pay | Attending: Family Medicine | Admitting: Family Medicine

## 2019-11-05 DIAGNOSIS — M5431 Sciatica, right side: Secondary | ICD-10-CM

## 2019-11-05 DIAGNOSIS — Z3202 Encounter for pregnancy test, result negative: Secondary | ICD-10-CM

## 2019-11-05 DIAGNOSIS — M5432 Sciatica, left side: Secondary | ICD-10-CM

## 2019-11-05 DIAGNOSIS — S39012D Strain of muscle, fascia and tendon of lower back, subsequent encounter: Secondary | ICD-10-CM

## 2019-11-05 DIAGNOSIS — M545 Low back pain: Secondary | ICD-10-CM

## 2019-11-05 LAB — POCT PREGNANCY, URINE: Preg Test, Ur: NEGATIVE

## 2019-11-05 LAB — POC URINE PREG, ED: Preg Test, Ur: NEGATIVE

## 2019-11-05 MED ORDER — KETOROLAC TROMETHAMINE 60 MG/2ML IM SOLN
INTRAMUSCULAR | Status: AC
  Filled 2019-11-05: qty 2

## 2019-11-05 MED ORDER — KETOROLAC TROMETHAMINE 60 MG/2ML IM SOLN
60.0000 mg | Freq: Once | INTRAMUSCULAR | Status: AC
  Administered 2019-11-05: 60 mg via INTRAMUSCULAR

## 2019-11-05 MED ORDER — MELOXICAM 15 MG PO TABS: 15.0000 mg | ORAL_TABLET | Freq: Every day | ORAL | 1 refills | Status: DC | PRN

## 2019-11-05 MED ORDER — METHYLPREDNISOLONE SODIUM SUCC 125 MG IJ SOLR
125.0000 mg | Freq: Once | INTRAMUSCULAR | Status: AC
  Administered 2019-11-05: 125 mg via INTRAMUSCULAR

## 2019-11-05 MED ORDER — METHYLPREDNISOLONE SODIUM SUCC 125 MG IJ SOLR
INTRAMUSCULAR | Status: AC
  Filled 2019-11-05: qty 2

## 2019-11-05 MED ORDER — METHOCARBAMOL 500 MG PO TABS: 500.0000 mg | ORAL_TABLET | Freq: Two times a day (BID) | ORAL | 0 refills | Status: DC

## 2019-11-05 NOTE — ED Provider Notes (Signed)
[place]    CSN: [id number] Arrival date & time: 11/05/19  1641      History   Chief Complaint Chief Complaint  Patient presents with  . Back Pain    HPI Rebecca Velasquez is a 34 y.o. female.   HPI  Rebecca Velasquez presents with symptoms worsening lumbar back pain. Onset of symptoms:Patient seen here at [facility] for the same problem on 10/31/19. 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 Pain is present with standing, bending, and lifting. Achieved mild relief wit prednisone, Norco, tizanidine,  however, pain reoccurred once medication was finished.  No new injury. Symptoms include persistent aching radiating from lower lumbar region radiating into the buttocks. Pain is exacerbated by positional changes,  prolonged standing, or inactivity.  Pain is at present unchanged from prior UC visit on 10/31/19. Endorses taking some combination of tylenol or ibuprofen as needed. She is obese and is currently daily smoker. Past Medical History:  Diagnosis Date  . Asthma   . Celiac disease   . Depression   . Endometriosis     There are no problems to display for this patient.   Past Surgical History:  Procedure Laterality Date  . GASTRIC BYPASS OPEN  2016  . IVC FILTER INSERTION  2016    OB History   No obstetric history on file.      Home Medications    Prior to Admission medications   Medication Sig Start Date End Date Taking? Authorizing Provider  albuterol (VENTOLIN HFA) 108 (90 Base) MCG/ACT inhaler Inhale 1-2 puffs into the lungs every 6 (six) hours as needed for wheezing or shortness of breath. 05/05/19   Raylene Everts, MD  cloNIDine (CATAPRES) 0.1 MG tablet Take 0.1 mg by mouth 3 (three) times daily as needed (anxiety).    [provider]  dicyclomine (BENTYL) 20 MG tablet Take 1 tablet (20 mg total) by mouth 3 (three) times daily before meals for 7 days. 08/30/19 09/06/19  Kinnie Feil, PA-C  HYDROcodone-acetaminophen  (NORCO) 7.5-325 MG tablet Take 1 tablet by mouth every 6 (six) hours as needed for moderate pain. 10/31/19   Raylene Everts, MD  hydrOXYzine (ATARAX/VISTARIL) 10 MG tablet Take 10 mg by mouth 3 (three) times daily as needed for anxiety.    [provider]  ibuprofen (ADVIL,MOTRIN) 200 MG tablet Take 800 mg by mouth every 6 (six) hours as needed (back pain).    [provider]  methylPREDNISolone (MEDROL DOSEPAK) 4 MG TBPK tablet tad 10/31/19   Raylene Everts, MD  Omeprazole 20 MG TBEC Take 20 mg by mouth daily as needed (acid reflux).     [provider]  ondansetron (ZOFRAN ODT) 4 MG disintegrating tablet Take 1 tablet (4 mg total) by mouth every 8 (eight) hours as needed for nausea or vomiting. 08/30/19   Kinnie Feil, PA-C  tiZANidine (ZANAFLEX) 4 MG tablet Take 1-2 tablets (4-8 mg total) by mouth every 6 (six) hours as needed for muscle spasms. 10/31/19   Raylene Everts, MD  traZODone (DESYREL) 100 MG tablet Take 100 mg by mouth at bedtime.    [provider]  vortioxetine HBr (TRINTELLIX) 10 MG TABS tablet Take 10 mg by mouth daily.    [provider]  DULoxetine (CYMBALTA) 60 MG capsule Take 60 mg by mouth daily.  05/05/19  [provider]  gabapentin (NEURONTIN) 300 MG capsule Take 300 mg by mouth 2 (two) times daily  as needed (back pain).  05/05/19  [provider]  QUEtiapine (SEROQUEL) 200 MG tablet Take 200 mg by mouth at bedtime.  05/05/19  [provider]    Family History Family History  Problem Relation Age of Onset  . Rheum arthritis Mother   . Cancer Mother   . Diabetes Father   . Cancer Father     Social History Social History   Tobacco Use  . Smoking status: Current Every Day Smoker    Packs/day: 0.50  . Smokeless tobacco: Never Used  Substance Use Topics  . Alcohol use: Never  . Drug use: Never     Allergies   Patient has no known allergies.   Review of Systems Review of  Systems Pertinent negatives listed in HPI Physical Exam Triage Vital Signs ED Triage Vitals  Enc Vitals Group     BP      Pulse      Resp      Temp      Temp src      SpO2      Weight      Height      Head Circumference      Peak Flow      Pain Score      Pain Loc      Pain Edu?      Excl. in [place]?    No data found.  Updated Vital Signs LMP 10/23/2019   Visual Acuity Right Eye Distance:   Left Eye Distance:   Bilateral Distance:    Right Eye Near:   Left Eye Near:    Bilateral Near:     Physical Exam General appearance: alert, well developed, obese , cooperative and in no distress Head: Normocephalic, without obvious abnormality, atraumatic Respiratory: Respirations even and unlabored, normal respiratory rate Heart: rate and rhythm normal.  Extremities: positive bilateral lumbosacral pain  Skin: Skin color, texture, turgor normal. No rashes seen  Psych: Appropriate mood and affect. Neurologic: Alert, oriented to person, place, and time, thought content appropriate. UC Treatments / Results  Labs (all labs ordered are listed, but only abnormal results are displayed) Labs Reviewed - No data to display  EKG   Radiology DG Lumbar Spine Complete  Result Date: 11/05/2019 CLINICAL DATA:  34 year old presenting with acute onset of low back pain that started last week. No known injuries. EXAM: LUMBAR SPINE - COMPLETE 4+ VIEW COMPARISON:  Bone window images from CT abdomen and pelvis 08/30/2019. Lumbar spine x-rays 04/28/2018. FINDINGS: Five non-rib-bearing lumbar vertebrae with a vestigial S1-S2 disc space. Anatomic posterior alignment. Straightening of the usual lumbar lordosis. No fractures. Well-preserved disc spaces. No pars defects. No significant facet arthropathy. Sacroiliac joints anatomically aligned without significant degenerative changes. IVC filter again noted. IMPRESSION: Straightening of the usual lumbar lordosis which may reflect positioning and/or spasm.  Otherwise normal examination. Electronically Signed   By: Evangeline Dakin M.D.   On: 11/05/2019 18:40    Procedures Procedures (including critical care time)  Medications Ordered in UC Medications - No data to display  Initial Impression / Assessment and Plan / UC Course  I have reviewed the triage vital signs and the nursing notes.  Pertinent labs & imaging results that were available during my care of the patient were reviewed by me and considered in my medical decision making (see chart for details).    Bilateral sciatica Strain of lumbar region, subsequent encounter Negative for acute injury. Symptoms related to chronic back pain. Recent completion of prednisone  and Norco for pain. Now symptoms of back pain returned.  Solumedrol 125 mg ordered. For outpatient management of symptoms Robaxin and Meloxicam. Schedule to follow-up with PCP in the morning  Final Clinical Impressions(s) / UC Diagnoses   Final diagnoses:  Bilateral sciatica  Strain of lumbar region, subsequent encounter   Discharge Instructions   None    ED Prescriptions    Medication Sig Dispense Auth. Provider   methocarbamol (ROBAXIN) 500 MG tablet Take 1 tablet (500 mg total) by mouth 2 (two) times daily. 20 tablet Scot Jun, FNP   meloxicam (MOBIC) 15 MG tablet Take 1 tablet (15 mg total) by mouth daily as needed for pain. Patient not taking:  Reported on 11/06/2019 30 tablet Scot Jun, FNP     PDMP not reviewed this encounter.   Scot Jun, [place] 11/06/19 [id number]

## 2019-11-05 NOTE — ED Triage Notes (Signed)
Pt present lower back pain,symptoms started a few days ago. 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

## 2019-11-06 ENCOUNTER — Encounter: Payer: Self-pay | Admitting: Family Medicine

## 2019-11-06 ENCOUNTER — Ambulatory Visit: Payer: Self-pay | Attending: Family Medicine | Admitting: Family Medicine

## 2019-11-06 DIAGNOSIS — M5431 Sciatica, right side: Secondary | ICD-10-CM

## 2019-11-06 DIAGNOSIS — N393 Stress incontinence (female) (male): Secondary | ICD-10-CM

## 2019-11-06 MED ORDER — GABAPENTIN 300 MG PO CAPS: 300.0000 mg | ORAL_CAPSULE | Freq: Every day | ORAL | 1 refills | Status: DC

## 2019-11-06 NOTE — Progress Notes (Signed)
Patient has been called and DOB has been verified. Patient has been screened and transferred to PCP to start phone visit.    Patient is having lower back pain.

## 2019-11-06 NOTE — Progress Notes (Signed)
Virtual Visit via Telephone Note  I connected with Rebecca Velasquez, on 11/06/2019 at 8:35 AM by telephone due to the COVID-19 pandemic and verified that I am speaking with the correct person using two identifiers.   Consent: I discussed the limitations, risks, security and privacy concerns of performing an evaluation and management service by telephone and the availability of in person appointments. I also discussed with the patient that there may be a patient responsible charge related to this service. The patient expressed understanding and agreed to proceed.   Location of Patient: Home  Location of Provider: Clinic   Persons participating in Telemedicine visit: Quantasia Stegner Farrington-CMA Dr. Margarita Rana     History of Present Illness: Rebecca Velasquez is a 34 year old female who presents today to establish care.  She has had 1 week history of lower back pain. 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 Right lower back hurts, shoots down her leg and through her abdomen. Breathing out increases the pain and she has to hold her breath. No position brings about improvement Pain is severe and describes as sharp. She has numbness and tingling on left hip but she has no LLE pain. She has no pain medications to take. She has also had bouts of incontinence prior to the Sciatica. When she coughs she has noticed episodes where she passes urine but denies sense of loss of sphincteric function. Seen at [facility] on 10/31/2019 where she received a prescription for tizanidine, Norco and again yesterday where she was treated with Toradol, IM Solu-Medrol.  She was prescribed meloxicam, Robaxin which she is yet to pick up from the pharmacy. Lumbar spine x-ray revealed:  IMPRESSION: Straightening of the usual lumbar lordosis which may reflect positioning and/or spasm. Otherwise normal examination.  Her sister takes gabapentin and she is wondering if she can receive this as well. Past  Medical History:  Diagnosis Date  . Asthma   . Celiac disease   . Depression   . Endometriosis    No Known Allergies  Current Outpatient Medications on File Prior to Visit  Medication Sig Dispense Refill  . albuterol (VENTOLIN HFA) 108 (90 Base) MCG/ACT inhaler Inhale 1-2 puffs into the lungs every 6 (six) hours as needed for wheezing or shortness of breath. 18 g 0  . cloNIDine (CATAPRES) 0.1 MG tablet Take 0.1 mg by mouth 3 (three) times daily as needed (anxiety).    . hydrOXYzine (ATARAX/VISTARIL) 10 MG tablet Take 10 mg by mouth 3 (three) times daily as needed for anxiety.    Marland Kitchen ibuprofen (ADVIL,MOTRIN) 200 MG tablet Take 800 mg by mouth every 6 (six) hours as needed (back pain).    . methocarbamol (ROBAXIN) 500 MG tablet Take 1 tablet (500 mg total) by mouth 2 (two) times daily. 20 tablet 0  . Omeprazole 20 MG TBEC Take 20 mg by mouth daily as needed (acid reflux).     . traZODone (DESYREL) 100 MG tablet Take 100 mg by mouth at bedtime.    . vortioxetine HBr (TRINTELLIX) 10 MG TABS tablet Take 10 mg by mouth daily.    Marland Kitchen dicyclomine (BENTYL) 20 MG tablet Take 1 tablet (20 mg total) by mouth 3 (three) times daily before meals for 7 days. 20 tablet 0  . HYDROcodone-acetaminophen (NORCO) 7.5-325 MG tablet Take 1 tablet by mouth every 6 (six) hours as needed for moderate pain. (Patient not taking: Reported on 11/06/2019) 15 tablet 0  . meloxicam (MOBIC) 15 MG tablet Take  1 tablet (15 mg total) by mouth daily as needed for pain. (Patient not taking: Reported on 11/06/2019) 30 tablet 1  . methylPREDNISolone (MEDROL DOSEPAK) 4 MG TBPK tablet tad (Patient not taking: Reported on 11/06/2019) 21 tablet 0  . ondansetron (ZOFRAN ODT) 4 MG disintegrating tablet Take 1 tablet (4 mg total) by mouth every 8 (eight) hours as needed for nausea or vomiting. (Patient not taking: Reported on 11/06/2019) 20 tablet 0  . tiZANidine (ZANAFLEX) 4 MG tablet Take 1-2 tablets (4-8 mg total) by mouth every 6 (six) hours as  needed for muscle spasms. (Patient not taking: Reported on 11/06/2019) 21 tablet 0  . [DISCONTINUED] DULoxetine (CYMBALTA) 60 MG capsule Take 60 mg by mouth daily.    . [DISCONTINUED] gabapentin (NEURONTIN) 300 MG capsule Take 300 mg by mouth 2 (two) times daily as needed (back pain).    . [DISCONTINUED] QUEtiapine (SEROQUEL) 200 MG tablet Take 200 mg by mouth at bedtime.     No current facility-administered medications on file prior to visit.    Observations/Objective: Alert, awake, oriented x3 Not in acute distress  Assessment and Plan: 1. Sciatica of right side Uncontrolled Discussed home exercise regimen Advised to pick up meloxicam and Robaxin from pharmacy We will add on gabapentin as per request-discussed sedating side effects and she knows to take this at night She will be out of work today and if she does not feel better on taking her medication she would reach out to me for an updated work note. We will see her in person visit in 3 weeks and if symptoms persist refer to PT. - gabapentin (NEURONTIN) 300 MG capsule; Take 1 capsule (300 mg total) by mouth at bedtime.  Dispense: 30 capsule; Refill: 1  2. Stress incontinence She has had no vaginal deliveries Counseled on Kegel exercises Stress incontinence likely unrelated to sciatica If symptoms persist consider oxybutynin   Follow Up Instructions: Return in about 3 weeks (around 11/27/2019) for Follow-up on sciatica.    I discussed the assessment and treatment plan with the patient. The patient was provided an opportunity to ask questions and all were answered. The patient agreed with the plan and demonstrated an understanding of the instructions.   The patient was advised to call back or seek an in-person evaluation if the symptoms worsen or if the condition fails to improve as anticipated.     I provided 18 minutes total of non-face-to-face time during this encounter including median intraservice time, reviewing previous  notes, investigations, ordering medications, medical decision making, coordinating care and patient verbalized understanding at the end of the visit.     Charlott Rakes, MD, FAAFP. [facility] and [place], [place]   11/06/2019, 8:35 AM

## 2020-02-08 ENCOUNTER — Ambulatory Visit (HOSPITAL_COMMUNITY)
Admission: EM | Admit: 2020-02-08 | Discharge: 2020-02-08 | Disposition: A | Discharge status: Home / Self Care | Payer: HRSA Program | Attending: Urgent Care | Admitting: Urgent Care

## 2020-02-08 ENCOUNTER — Encounter (HOSPITAL_COMMUNITY): Payer: Self-pay

## 2020-02-08 ENCOUNTER — Other Ambulatory Visit: Payer: Self-pay

## 2020-02-08 DIAGNOSIS — J0110 Acute frontal sinusitis, unspecified: Secondary | ICD-10-CM | POA: Diagnosis not present

## 2020-02-08 DIAGNOSIS — Z8261 Family history of arthritis: Secondary | ICD-10-CM | POA: Diagnosis not present

## 2020-02-08 DIAGNOSIS — F17210 Nicotine dependence, cigarettes, uncomplicated: Secondary | ICD-10-CM | POA: Insufficient documentation

## 2020-02-08 DIAGNOSIS — Z20822 Contact with and (suspected) exposure to covid-19: Secondary | ICD-10-CM | POA: Insufficient documentation

## 2020-02-08 DIAGNOSIS — F329 Major depressive disorder, single episode, unspecified: Secondary | ICD-10-CM | POA: Insufficient documentation

## 2020-02-08 DIAGNOSIS — Z79899 Other long term (current) drug therapy: Secondary | ICD-10-CM | POA: Diagnosis not present

## 2020-02-08 DIAGNOSIS — H9202 Otalgia, left ear: Secondary | ICD-10-CM | POA: Diagnosis present

## 2020-02-08 DIAGNOSIS — Z9884 Bariatric surgery status: Secondary | ICD-10-CM | POA: Diagnosis not present

## 2020-02-08 DIAGNOSIS — H6690 Otitis media, unspecified, unspecified ear: Secondary | ICD-10-CM | POA: Insufficient documentation

## 2020-02-08 DIAGNOSIS — J45909 Unspecified asthma, uncomplicated: Secondary | ICD-10-CM | POA: Diagnosis not present

## 2020-02-08 DIAGNOSIS — Z833 Family history of diabetes mellitus: Secondary | ICD-10-CM | POA: Insufficient documentation

## 2020-02-08 DIAGNOSIS — K900 Celiac disease: Secondary | ICD-10-CM | POA: Diagnosis not present

## 2020-02-08 MED ORDER — AMOXICILLIN-POT CLAVULANATE 875-125 MG PO TABS: 1.0000 | ORAL_TABLET | Freq: Two times a day (BID) | ORAL | 0 refills | Status: DC

## 2020-02-08 MED ORDER — FLUTICASONE PROPIONATE 50 MCG/ACT NA SUSP: 1.0000 | Freq: Every day | NASAL | 2 refills | Status: DC

## 2020-02-08 NOTE — Discharge Instructions (Signed)
Treating you for sinusitis and mild ear infection. Keep doing the daily antihistamine and Flonase nasal spray.  Take the antibiotic as prescribed. You can take Tylenol or ibuprofen for pain Follow up as needed for continued or worsening symptoms

## 2020-02-08 NOTE — ED Triage Notes (Signed)
C/o left ear pain, post nasal drip, and cough x4 days

## 2020-02-08 NOTE — ED Provider Notes (Signed)
[place]    CSN: [id number] Arrival date & time: 02/08/20  0076      History   Chief Complaint Chief Complaint  Patient presents with  . Otalgia  . Sinus Problem  . Cough  . Fatigue    HPI Rebecca Velasquez is a 34 y.o. female.   Patient is a 34 year old female with past medical history of asthma, celiac disease, depression, endometriosis.  She presents today with approximately 4 to 5 days of left ear pain, postnasal drip, sinus congestion, facial tenderness.  Symptoms have been constant and worsening.  She has been using antihistamine, DayQuil and ibuprofen without much relief.  No associated fever, cough, chest congestion, loss of taste or smell.  Does have a history of seasonal allergies  ROS per HPI      Past Medical History:  Diagnosis Date  . Asthma   . Celiac disease   . Depression   . Endometriosis     There are no problems to display for this patient.   Past Surgical History:  Procedure Laterality Date  . GASTRIC BYPASS OPEN  2016  . IVC FILTER INSERTION  2016    OB History   No obstetric history on file.      Home Medications    Prior to Admission medications   Medication Sig Start Date End Date Taking? Authorizing Provider  albuterol (VENTOLIN HFA) 108 (90 Base) MCG/ACT inhaler Inhale 1-2 puffs into the lungs every 6 (six) hours as needed for wheezing or shortness of breath. 05/05/19   Raylene [place], MD  amoxicillin-clavulanate (AUGMENTIN) 875-125 MG tablet Take 1 tablet by mouth every 12 (twelve) hours. 02/08/20   Loura Halt A, NP  cloNIDine (CATAPRES) 0.1 MG tablet Take 0.1 mg by mouth 3 (three) times daily as needed (anxiety).    [provider]  dicyclomine (BENTYL) 20 MG tablet Take 1 tablet (20 mg total) by mouth 3 (three) times daily before meals for 7 days. 08/30/19 09/06/19  Kinnie Feil, PA-C  fluticasone (FLONASE) 50 MCG/ACT nasal spray Place 1 spray into both nostrils daily. 02/08/20   Loura Halt A, NP    gabapentin (NEURONTIN) 300 MG capsule Take 1 capsule (300 mg total) by mouth at bedtime. 11/06/19   Charlott Rakes, MD  hydrOXYzine (ATARAX/VISTARIL) 10 MG tablet Take 10 mg by mouth 3 (three) times daily as needed for anxiety.    [provider]  ibuprofen (ADVIL,MOTRIN) 200 MG tablet Take 800 mg by mouth every 6 (six) hours as needed (back pain).    [provider]  meloxicam (MOBIC) 15 MG tablet Take 1 tablet (15 mg total) by mouth daily as needed for pain. Patient not taking: Reported on 11/06/2019 11/05/19   Scot Jun, FNP  methocarbamol (ROBAXIN) 500 MG tablet Take 1 tablet (500 mg total) by mouth 2 (two) times daily. 11/05/19   Scot Jun, FNP  Omeprazole 20 MG TBEC Take 20 mg by mouth daily as needed (acid reflux).     [provider]  traZODone (DESYREL) 100 MG tablet Take 100 mg by mouth at bedtime.    [provider]  vortioxetine HBr (TRINTELLIX) 10 MG TABS tablet Take 10 mg by mouth daily.    [provider]  DULoxetine (CYMBALTA) 60 MG capsule Take 60 mg by mouth daily.  05/05/19  [provider]  QUEtiapine (SEROQUEL) 200 MG tablet Take 200 mg by mouth at bedtime.  05/05/19  [provider]    Family History  Family History  Problem Relation Age of Onset  . Rheum arthritis Mother   . Cancer Mother   . Diabetes Father   . Cancer Father     Social History Social History   Tobacco Use  . Smoking status: Current Every Day Smoker    Packs/day: 0.50  . Smokeless tobacco: Never Used  Substance Use Topics  . Alcohol use: Never  . Drug use: Never     Allergies   Patient has no known allergies.   Review of Systems Review of Systems   Physical Exam Triage Vital Signs ED Triage Vitals  Enc Vitals Group     BP 02/08/20 0958 (!) 124/59     Pulse Rate 02/08/20 0958 73     Resp 02/08/20 0958 17     Temp 02/08/20 0958 98.3 F (36.8 C)     Temp Source 02/08/20 0958 Oral     SpO2 02/08/20 0958 99 %      Weight --      Height --      Head Circumference --      Peak Flow --      Pain Score 02/08/20 0957 6     Pain Loc --      Pain Edu? --      Excl. in [place]? --    No data found.  Updated Vital Signs BP (!) 124/59 (BP Location: Left Arm)   Pulse 73   Temp 98.3 F (36.8 C) (Oral)   Resp 17   LMP 01/25/2020 (Within Days)   SpO2 99%   Visual Acuity Right Eye Distance:   Left Eye Distance:   Bilateral Distance:    Right Eye Near:   Left Eye Near:    Bilateral Near:     Physical Exam Vitals and nursing note reviewed.  Constitutional:      General: She is not in acute distress.    Appearance: Normal appearance. She is not ill-appearing, toxic-appearing or diaphoretic.  HENT:     Head: Normocephalic.     Right Ear: Hearing, tympanic membrane and ear canal normal.     Left Ear: Hearing normal. Tympanic membrane is erythematous and bulging.     Nose:     Right Sinus: No maxillary sinus tenderness or frontal sinus tenderness.     Left Sinus: Maxillary sinus tenderness and frontal sinus tenderness present.     Mouth/Throat:     Pharynx: Oropharynx is clear.  Eyes:     Conjunctiva/sclera: Conjunctivae normal.  Pulmonary:     Effort: Pulmonary effort is normal.  Musculoskeletal:        General: Normal range of motion.     Cervical back: Normal range of motion.  Skin:    General: Skin is warm and dry.     Findings: No rash.  Neurological:     Mental Status: She is alert.  Psychiatric:        Mood and Affect: Mood normal.      UC Treatments / Results  Labs (all labs ordered are listed, but only abnormal results are displayed) Labs Reviewed  SARS CORONAVIRUS 2 (TAT 6-24 HRS)    EKG   Radiology No results found.  Procedures Procedures (including critical care time)  Medications Ordered in UC Medications - No data to display  Initial Impression / Assessment and Plan / UC Course  I have reviewed the triage vital signs and the nursing notes.  Pertinent  labs & imaging results that were available during my  care of the patient were reviewed by me and considered in my medical decision making (see chart for details).     Otitis media and sinusitis. Treating with Augmentin and Flonase nasal spray.  We will have her continue the antihistamine. Tylenol or ibuprofen as needed for pain. Follow up as needed for continued or worsening symptoms  Final Clinical Impressions(s) / UC Diagnoses   Final diagnoses:  Acute non-recurrent frontal sinusitis  Acute otitis media, unspecified otitis media type     Discharge Instructions     Treating you for sinusitis and mild ear infection. Keep doing the daily antihistamine and Flonase nasal spray.  Take the antibiotic as prescribed. You can take Tylenol or ibuprofen for pain Follow up as needed for continued or worsening symptoms     ED Prescriptions    Medication Sig Dispense Auth. Provider   amoxicillin-clavulanate (AUGMENTIN) 875-125 MG tablet Take 1 tablet by mouth every 12 (twelve) hours. 14 tablet Lourdes Kucharski A, NP   fluticasone (FLONASE) 50 MCG/ACT nasal spray Place 1 spray into both nostrils daily. 16 g Loura Halt A, NP     PDMP not reviewed this encounter.   Orvan July, NP 02/08/20 1113

## 2020-02-09 LAB — SARS CORONAVIRUS 2 (TAT 6-24 HRS): SARS Coronavirus 2: NEGATIVE

## 2020-03-18 ENCOUNTER — Encounter (HOSPITAL_COMMUNITY): Payer: Self-pay

## 2020-03-18 ENCOUNTER — Other Ambulatory Visit: Payer: Self-pay

## 2020-03-18 ENCOUNTER — Ambulatory Visit (HOSPITAL_COMMUNITY)
Admission: EM | Admit: 2020-03-18 | Discharge: 2020-03-18 | Disposition: A | Discharge status: Home / Self Care | Payer: Self-pay | Attending: Family Medicine | Admitting: Family Medicine

## 2020-03-18 DIAGNOSIS — J019 Acute sinusitis, unspecified: Secondary | ICD-10-CM

## 2020-03-18 MED ORDER — PREDNISONE 20 MG PO TABS: 40.0000 mg | ORAL_TABLET | Freq: Every day | ORAL | 0 refills | Status: AC

## 2020-03-18 MED ORDER — ONDANSETRON 4 MG PO TBDP: 4.0000 mg | ORAL_TABLET | Freq: Three times a day (TID) | ORAL | 0 refills | Status: DC | PRN

## 2020-03-18 MED ORDER — BENZONATATE 200 MG PO CAPS: 200.0000 mg | ORAL_CAPSULE | Freq: Three times a day (TID) | ORAL | 0 refills | Status: AC | PRN

## 2020-03-18 MED ORDER — DOXYCYCLINE HYCLATE 100 MG PO CAPS: 100.0000 mg | ORAL_CAPSULE | Freq: Two times a day (BID) | ORAL | 0 refills | Status: AC

## 2020-03-18 NOTE — ED Triage Notes (Signed)
Patient states that after he last visit, things got better for about 1 week. Reports she began experiencing sinus pain/pressure, cough, nasal drainage, and ear pain that radiates down to the jaw on both sides. Denies concerns for COVID

## 2020-03-18 NOTE — ED Provider Notes (Signed)
[place]    CSN: [id number] Arrival date & time: 03/18/20  1525      History   Chief Complaint Chief Complaint  Patient presents with  . Sinus Problem    HPI Rebecca Velasquez is a 34 y.o. female history of asthma, presenting today for evaluation of sinus symptoms.  Patient reports over the past 2 to 3 weeks she has had sinus pressure, congestion, postnasal drainage as well as a worsening cough of recently in the past 3 days.  She has had ear pressure and feels as if there is fluid rattling on her ears.  Was seen here on 5/13 and was treated for sinusitis with Augmentin and Flonase, symptoms improved and subsided for approximately 1 week, but then returned.  She has been using Claritin, Singulair and Flonase consistently.  Reports slight nausea.  HPI  Past Medical History:  Diagnosis Date  . Asthma   . Celiac disease   . Depression   . Endometriosis     There are no problems to display for this patient.   Past Surgical History:  Procedure Laterality Date  . GASTRIC BYPASS OPEN  2016  . IVC FILTER INSERTION  2016    OB History   No obstetric history on file.      Home Medications    Prior to Admission medications   Medication Sig Start Date End Date Taking? Authorizing Provider  albuterol (VENTOLIN HFA) 108 (90 Base) MCG/ACT inhaler Inhale 1-2 puffs into the lungs every 6 (six) hours as needed for wheezing or shortness of breath. 05/05/19   Raylene [place]ts, MD  benzonatate (TESSALON) 200 MG capsule Take 1 capsule (200 mg total) by mouth 3 (three) times daily as needed for up to 7 days for cough. 03/18/20 03/25/20  Deane Wattenbarger C, PA-C  cloNIDine (CATAPRES) 0.1 MG tablet Take 0.1 mg by mouth 3 (three) times daily as needed (anxiety).    [provider]  dicyclomine (BENTYL) 20 MG tablet Take 1 tablet (20 mg total) by mouth 3 (three) times daily before meals for 7 days. 08/30/19 09/06/19  Kinnie Feil, PA-C  doxycycline (VIBRAMYCIN) 100 MG  capsule Take 1 capsule (100 mg total) by mouth 2 (two) times daily for 10 days. 03/18/20 03/28/20  Mase Dhondt C, PA-C  fluticasone (FLONASE) 50 MCG/ACT nasal spray Place 1 spray into both nostrils daily. 02/08/20   Loura Halt A, NP  gabapentin (NEURONTIN) 300 MG capsule Take 1 capsule (300 mg total) by mouth at bedtime. 11/06/19   Charlott Rakes, MD  hydrOXYzine (ATARAX/VISTARIL) 10 MG tablet Take 10 mg by mouth 3 (three) times daily as needed for anxiety.    [provider]  ibuprofen (ADVIL,MOTRIN) 200 MG tablet Take 800 mg by mouth every 6 (six) hours as needed (back pain).    [provider]  meloxicam (MOBIC) 15 MG tablet Take 1 tablet (15 mg total) by mouth daily as needed for pain. Patient not taking: Reported on 11/06/2019 11/05/19   Scot Jun, FNP  methocarbamol (ROBAXIN) 500 MG tablet Take 1 tablet (500 mg total) by mouth 2 (two) times daily. 11/05/19   Scot Jun, FNP  Omeprazole 20 MG TBEC Take 20 mg by mouth daily as needed (acid reflux).     [provider]  ondansetron (ZOFRAN ODT) 4 MG disintegrating tablet Take 1 tablet (4 mg total) by mouth every 8 (eight) hours as needed for nausea or vomiting. 03/18/20   Lihanna Biever C, PA-C  predniSONE (  DELTASONE) 20 MG tablet Take 2 tablets (40 mg total) by mouth daily with breakfast for 5 days. 03/18/20 03/23/20  Asyia Hornung C, PA-C  traZODone (DESYREL) 100 MG tablet Take 100 mg by mouth at bedtime.    [provider]  vortioxetine HBr (TRINTELLIX) 10 MG TABS tablet Take 10 mg by mouth daily.    [provider]  DULoxetine (CYMBALTA) 60 MG capsule Take 60 mg by mouth daily.  05/05/19  [provider]  QUEtiapine (SEROQUEL) 200 MG tablet Take 200 mg by mouth at bedtime.  05/05/19  [provider]    Family History Family History  Problem Relation Age of Onset  . Rheum arthritis Mother   . Cancer Mother   . Diabetes Father   . Cancer Father     Social  History Social History   Tobacco Use  . Smoking status: Current Every Day Smoker    Packs/day: 0.50  . Smokeless tobacco: Never Used  Vaping Use  . Vaping Use: Never used  Substance Use Topics  . Alcohol use: Never  . Drug use: Never     Allergies   Patient has no known allergies.   Review of Systems Review of Systems  Constitutional: Positive for fatigue. Negative for activity change, appetite change, chills and fever.  HENT: Positive for congestion, rhinorrhea and sinus pressure. Negative for ear pain, sore throat and trouble swallowing.   Eyes: Negative for discharge and redness.  Respiratory: Positive for cough. Negative for chest tightness and shortness of breath.   Cardiovascular: Negative for chest pain.  Gastrointestinal: Negative for abdominal pain, diarrhea, nausea and vomiting.  Musculoskeletal: Negative for myalgias.  Skin: Negative for rash.  Neurological: Negative for dizziness, light-headedness and headaches.     Physical Exam Triage Vital Signs ED Triage Vitals  Enc Vitals Group     BP 03/18/20 1622 134/82     Pulse Rate 03/18/20 1622 74     Resp 03/18/20 1622 14     Temp 03/18/20 1622 99 F (37.2 C)     Temp Source 03/18/20 1622 Oral     SpO2 03/18/20 1622 100 %     Weight --      Height --      Head Circumference --      Peak Flow --      Pain Score 03/18/20 1620 7     Pain Loc --      Pain Edu? --      Excl. in [place]? --    No data found.  Updated Vital Signs BP 134/82 (BP Location: Right Arm)   Pulse 74   Temp 99 F (37.2 C) (Oral)   Resp 14   SpO2 100%   Visual Acuity Right Eye Distance:   Left Eye Distance:   Bilateral Distance:    Right Eye Near:   Left Eye Near:    Bilateral Near:     Physical Exam Vitals and nursing note reviewed.  Constitutional:      Appearance: She is well-developed.     Comments: No acute distress  HENT:     Head: Normocephalic and atraumatic.     Ears:     Comments: Bilateral ears without  tenderness to palpation of external auricle, tragus and mastoid, EAC's without erythema or swelling, TM's with good bony landmarks and cone of light. Non erythematous.     Nose: Nose normal.     Mouth/Throat:     Comments: Oral mucosa pink and moist, no  tonsillar enlargement or exudate. Posterior pharynx patent and nonerythematous, no uvula deviation or swelling. Normal phonation. Eyes:     Conjunctiva/sclera: Conjunctivae normal.  Cardiovascular:     Rate and Rhythm: Normal rate.  Pulmonary:     Effort: Pulmonary effort is normal. No respiratory distress.     Comments: Breathing comfortably at rest, CTABL, no wheezing, rales or other adventitious sounds auscultated Abdominal:     General: There is no distension.  Musculoskeletal:        General: Normal range of motion.     Cervical back: Neck supple.  Skin:    General: Skin is warm and dry.  Neurological:     Mental Status: She is alert and oriented to person, place, and time.      UC Treatments / Results  Labs (all labs ordered are listed, but only abnormal results are displayed) Labs Reviewed - No data to display  EKG   Radiology No results found.  Procedures Procedures (including critical care time)  Medications Ordered in UC Medications - No data to display  Initial Impression / Assessment and Plan / UC Course  I have reviewed the triage vital signs and the nursing notes.  Pertinent labs & imaging results that were available during my care of the patient were reviewed by me and considered in my medical decision making (see chart for details).     Treating for sinusitis-providing doxycycline given on Augmentin last time.  Prednisone 40 mg daily x5 days.  Continue allergy medicines.  Tessalon for cough, Zofran for nausea, rest and fluids.  Discussed if continuing to have recurrent sinusitis may need to follow-up with ENT.  Discussed strict return precautions. Patient verbalized understanding and is agreeable with  plan.  Final Clinical Impressions(s) / UC Diagnoses   Final diagnoses:  Acute sinusitis with symptoms > 10 days     Discharge Instructions     Doxycycline twice dialy for 10 days Prednisone 40 mg daily x 5 days with food Continue allergy meds Tessalon/benzonatate for cough every 8 hours Rest and fluids Follow up if not improving or worsening   ED Prescriptions    Medication Sig Dispense Auth. Provider   doxycycline (VIBRAMYCIN) 100 MG capsule Take 1 capsule (100 mg total) by mouth 2 (two) times daily for 10 days. 20 capsule Iolani Twilley C, PA-C   predniSONE (DELTASONE) 20 MG tablet Take 2 tablets (40 mg total) by mouth daily with breakfast for 5 days. 10 tablet Baron Parmelee C, PA-C   benzonatate (TESSALON) 200 MG capsule Take 1 capsule (200 mg total) by mouth 3 (three) times daily as needed for up to 7 days for cough. 28 capsule Devanee Pomplun C, PA-C   ondansetron (ZOFRAN ODT) 4 MG disintegrating tablet Take 1 tablet (4 mg total) by mouth every 8 (eight) hours as needed for nausea or vomiting. 20 tablet Anthoney Sheppard, [place] C, PA-C     PDMP not reviewed this encounter.   Janith Lima, [place] 03/18/20 1637

## 2020-03-18 NOTE — Discharge Instructions (Signed)
Doxycycline twice dialy for 10 days Prednisone 40 mg daily x 5 days with food Continue allergy meds Tessalon/benzonatate for cough every 8 hours Rest and fluids Follow up if not improving or worsening

## 2020-04-20 ENCOUNTER — Other Ambulatory Visit: Payer: Self-pay

## 2020-04-20 ENCOUNTER — Emergency Department (HOSPITAL_COMMUNITY)
Admission: EM | Admit: 2020-04-20 | Discharge: 2020-04-20 | Disposition: A | Discharge status: Home / Self Care | Payer: Self-pay | Attending: Emergency Medicine | Admitting: Emergency Medicine

## 2020-04-20 ENCOUNTER — Ambulatory Visit (HOSPITAL_COMMUNITY)
Admission: EM | Admit: 2020-04-20 | Discharge: 2020-04-20 | Disposition: A | Discharge status: Home / Self Care | Payer: HRSA Program | Attending: Emergency Medicine | Admitting: Emergency Medicine

## 2020-04-20 ENCOUNTER — Encounter (HOSPITAL_COMMUNITY): Payer: Self-pay

## 2020-04-20 ENCOUNTER — Emergency Department (HOSPITAL_COMMUNITY): Payer: Self-pay

## 2020-04-20 DIAGNOSIS — J069 Acute upper respiratory infection, unspecified: Secondary | ICD-10-CM | POA: Diagnosis not present

## 2020-04-20 DIAGNOSIS — F17210 Nicotine dependence, cigarettes, uncomplicated: Secondary | ICD-10-CM | POA: Diagnosis not present

## 2020-04-20 DIAGNOSIS — Z5321 Procedure and treatment not carried out due to patient leaving prior to being seen by health care provider: Secondary | ICD-10-CM | POA: Insufficient documentation

## 2020-04-20 DIAGNOSIS — R110 Nausea: Secondary | ICD-10-CM | POA: Insufficient documentation

## 2020-04-20 DIAGNOSIS — Z79899 Other long term (current) drug therapy: Secondary | ICD-10-CM | POA: Diagnosis not present

## 2020-04-20 DIAGNOSIS — Z7951 Long term (current) use of inhaled steroids: Secondary | ICD-10-CM | POA: Diagnosis not present

## 2020-04-20 DIAGNOSIS — J4521 Mild intermittent asthma with (acute) exacerbation: Secondary | ICD-10-CM | POA: Diagnosis not present

## 2020-04-20 DIAGNOSIS — R0602 Shortness of breath: Secondary | ICD-10-CM | POA: Insufficient documentation

## 2020-04-20 DIAGNOSIS — R05 Cough: Secondary | ICD-10-CM | POA: Insufficient documentation

## 2020-04-20 DIAGNOSIS — Z20822 Contact with and (suspected) exposure to covid-19: Secondary | ICD-10-CM | POA: Diagnosis not present

## 2020-04-20 DIAGNOSIS — Z9884 Bariatric surgery status: Secondary | ICD-10-CM | POA: Diagnosis not present

## 2020-04-20 DIAGNOSIS — Z7952 Long term (current) use of systemic steroids: Secondary | ICD-10-CM | POA: Diagnosis not present

## 2020-04-20 DIAGNOSIS — K900 Celiac disease: Secondary | ICD-10-CM | POA: Diagnosis not present

## 2020-04-20 DIAGNOSIS — F329 Major depressive disorder, single episode, unspecified: Secondary | ICD-10-CM | POA: Insufficient documentation

## 2020-04-20 DIAGNOSIS — R0981 Nasal congestion: Secondary | ICD-10-CM | POA: Insufficient documentation

## 2020-04-20 DIAGNOSIS — J45909 Unspecified asthma, uncomplicated: Secondary | ICD-10-CM | POA: Insufficient documentation

## 2020-04-20 DIAGNOSIS — R509 Fever, unspecified: Secondary | ICD-10-CM | POA: Insufficient documentation

## 2020-04-20 LAB — CBC WITH DIFFERENTIAL/PLATELET
Abs Immature Granulocytes: 0.01 10*3/uL (ref 0.00–0.07)
Basophils Absolute: 0 10*3/uL (ref 0.0–0.1)
Basophils Relative: 1 %
Eosinophils Absolute: 0.1 10*3/uL (ref 0.0–0.5)
Eosinophils Relative: 2 %
HCT: 35.6 % — ABNORMAL LOW (ref 36.0–46.0)
Hemoglobin: 10.9 g/dL — ABNORMAL LOW (ref 12.0–15.0)
Immature Granulocytes: 0 %
Lymphocytes Relative: 15 %
Lymphs Abs: 0.8 10*3/uL (ref 0.7–4.0)
MCH: 26.9 pg (ref 26.0–34.0)
MCHC: 30.6 g/dL (ref 30.0–36.0)
MCV: 87.9 fL (ref 80.0–100.0)
Monocytes Absolute: 0.4 10*3/uL (ref 0.1–1.0)
Monocytes Relative: 7 %
Neutro Abs: 4.1 10*3/uL (ref 1.7–7.7)
Neutrophils Relative %: 75 %
Platelets: 244 10*3/uL (ref 150–400)
RBC: 4.05 MIL/uL (ref 3.87–5.11)
RDW: 14.5 % (ref 11.5–15.5)
WBC: 5.4 10*3/uL (ref 4.0–10.5)
nRBC: 0 % (ref 0.0–0.2)

## 2020-04-20 LAB — BASIC METABOLIC PANEL
Anion gap: 9 (ref 5–15)
BUN: 7 mg/dL (ref 6–20)
CO2: 23 mmol/L (ref 22–32)
Calcium: 9.1 mg/dL (ref 8.9–10.3)
Chloride: 107 mmol/L (ref 98–111)
Creatinine, Ser: 0.82 mg/dL (ref 0.44–1.00)
GFR calc Af Amer: >60 mL/min (ref >60)
GFR calc non Af Amer: >60 mL/min (ref >60)
Glucose, Bld: 132 mg/dL — ABNORMAL HIGH (ref 70–99)
Potassium: 4.2 mmol/L (ref 3.5–5.1)
Sodium: 139 mmol/L (ref 135–145)

## 2020-04-20 LAB — I-STAT BETA HCG BLOOD, ED (MC, WL, AP ONLY): I-stat hCG, quantitative: <5 m[IU]/mL (ref <5)

## 2020-04-20 LAB — SARS CORONAVIRUS 2 (TAT 6-24 HRS): SARS Coronavirus 2: NEGATIVE

## 2020-04-20 MED ORDER — ALBUTEROL SULFATE HFA 108 (90 BASE) MCG/ACT IN AERS: 1.0000 | INHALATION_SPRAY | Freq: Four times a day (QID) | RESPIRATORY_TRACT | 0 refills | Status: DC | PRN

## 2020-04-20 MED ORDER — ALBUTEROL SULFATE HFA 108 (90 BASE) MCG/ACT IN AERS
2.0000 | INHALATION_SPRAY | Freq: Once | RESPIRATORY_TRACT | Status: DC
  Filled 2020-04-20: qty 6.7

## 2020-04-20 MED ORDER — BENZONATATE 100 MG PO CAPS: 100.0000 mg | ORAL_CAPSULE | Freq: Three times a day (TID) | ORAL | 0 refills | Status: DC

## 2020-04-20 MED ORDER — PREDNISONE 20 MG PO TABS: 40.0000 mg | ORAL_TABLET | Freq: Every day | ORAL | 0 refills | Status: AC

## 2020-04-20 NOTE — Discharge Instructions (Signed)
Push fluids to ensure adequate hydration and keep secretions thin.  Tylenol and/or ibuprofen as needed for pain or fevers.  Use of inhaler as needed for wheezing or shortness of breath.   Prednisone dose pack.  Mucinex d for example may help as an expectorant. Self isolate until covid results are back and negative.  Will notify you by phone of any positive findings. Your negative results will be sent through your MyChart.     If symptoms worsen or do not improve in the next week to return to be seen or to follow up with your PCP.

## 2020-04-20 NOTE — ED Triage Notes (Signed)
Three days ago pt developed nasal congestion and then over the last day pt has developed shortness of breath, body aches, and cough. Hasn't taken her temperature but feels as though she's had a fever. Hx asthma, using inhaler frequently. 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

## 2020-04-20 NOTE — ED Notes (Signed)
Attempted to give inhaler to pt in lieu of nebulizer d/t covid protocols in triage. Pt sts she has an inhaler at home and is leaving.

## 2020-04-20 NOTE — ED Triage Notes (Addendum)
Pt has a history of asthma, 3 days ago she started wheezing with sob. Pt has been having trouble breathing with chest and back pain from coughing a lot

## 2020-04-22 NOTE — ED Provider Notes (Signed)
[name]    CSN: [id number] Arrival date & time: 04/20/20  1700      History   Chief Complaint Chief Complaint  Patient presents with  . Shortness of Breath  . Asthma    HPI Rebecca Velasquez is a 34 y.o. female.   Rebecca Velasquez presents with complaints of sinus pressure which started 5 days ago, and over the past 2 days ago has developed productive cough, chest congestion, some shortness of breath . Noted a fever this morning. Has been taking advil and tylenol which do help some. Mild nausea, no vomiting or diarrhea. No sore throat or ear pain. History of allergies and asthma. She has been using her inhaler which temporarily helps. Works Scientist, research (medical). No known ill contacts. She has gotten covid vaccine.    ROS per HPI, negative if not otherwise mentioned.      Past Medical History:  Diagnosis Date  . Asthma   . Celiac disease   . Depression   . Endometriosis     There are no problems to display for this patient.   Past Surgical History:  Procedure Laterality Date  . GASTRIC BYPASS OPEN  2016  . IVC FILTER INSERTION  2016    OB History   No obstetric history on file.      Home Medications    Prior to Admission medications   Medication Sig Start Date End Date Taking? Authorizing Provider  albuterol (VENTOLIN HFA) 108 (90 Base) MCG/ACT inhaler Inhale 1-2 puffs into the lungs every 6 (six) hours as needed for wheezing or shortness of breath. 04/20/20   Zigmund Gottron, NP  benzonatate (TESSALON) 100 MG capsule Take 1 capsule (100 mg total) by mouth every 8 (eight) hours. 04/20/20   Zigmund Gottron, NP  cloNIDine (CATAPRES) 0.1 MG tablet Take 0.1 mg by mouth 3 (three) times daily as needed (anxiety).    [provider]  dicyclomine (BENTYL) 20 MG tablet Take 1 tablet (20 mg total) by mouth 3 (three) times daily before meals for 7 days. 08/30/19 09/06/19  Kinnie Feil, PA-C  fluticasone (FLONASE) 50 MCG/ACT nasal spray Place 1 spray into both  nostrils daily. 02/08/20   Loura Halt A, NP  gabapentin (NEURONTIN) 300 MG capsule Take 1 capsule (300 mg total) by mouth at bedtime. 11/06/19   Charlott Rakes, MD  hydrOXYzine (ATARAX/VISTARIL) 10 MG tablet Take 10 mg by mouth 3 (three) times daily as needed for anxiety.    [provider]  ibuprofen (ADVIL,MOTRIN) 200 MG tablet Take 800 mg by mouth every 6 (six) hours as needed (back pain).    [provider]  meloxicam (MOBIC) 15 MG tablet Take 1 tablet (15 mg total) by mouth daily as needed for pain. Patient not taking: Reported on 11/06/2019 11/05/19   Scot Jun, FNP  methocarbamol (ROBAXIN) 500 MG tablet Take 1 tablet (500 mg total) by mouth 2 (two) times daily. 11/05/19   Scot Jun, FNP  Omeprazole 20 MG TBEC Take 20 mg by mouth daily as needed (acid reflux).     [provider]  ondansetron (ZOFRAN ODT) 4 MG disintegrating tablet Take 1 tablet (4 mg total) by mouth every 8 (eight) hours as needed for nausea or vomiting. 03/18/20   Wieters, Hallie C, PA-C  predniSONE (DELTASONE) 20 MG tablet Take 2 tablets (40 mg total) by mouth daily with breakfast for 5 days. 04/20/20 04/25/20  Zigmund Gottron, NP  traZODone (DESYREL) 100 MG tablet Take 100  mg by mouth at bedtime.    [provider]  vortioxetine HBr (TRINTELLIX) 10 MG TABS tablet Take 10 mg by mouth daily.    [provider]  DULoxetine (CYMBALTA) 60 MG capsule Take 60 mg by mouth daily.  05/05/19  [provider]  QUEtiapine (SEROQUEL) 200 MG tablet Take 200 mg by mouth at bedtime.  05/05/19  [provider]    Family History Family History  Problem Relation Age of Onset  . Rheum arthritis Mother   . Cancer Mother   . Diabetes Father   . Cancer Father     Social History Social History   Tobacco Use  . Smoking status: Current Every Day Smoker    Packs/day: 0.50  . Smokeless tobacco: Never Used  Vaping Use  . Vaping Use: Never used  Substance Use Topics  .  Alcohol use: Never  . Drug use: Never     Allergies   Patient has no known allergies.   Review of Systems Review of Systems   Physical Exam Triage Vital Signs ED Triage Vitals  Enc Vitals Group     BP 04/20/20 1733 (!) 109/86     Pulse Rate 04/20/20 1733 68     Resp 04/20/20 1733 22     Temp 04/20/20 1733 98 F (36.7 C)     Temp Source 04/20/20 1733 Oral     SpO2 04/20/20 1733 99 %     Weight --      Height --      Head Circumference --      Peak Flow --      Pain Score 04/20/20 1735 5     Pain Loc --      Pain Edu? --      Excl. in [place]? --    No data found.  Updated Vital Signs BP (!) 109/86 (BP Location: Right Arm)   Pulse 68   Temp 98 F (36.7 C) (Oral)   Resp 22   LMP 04/06/2020 (Approximate)   SpO2 99%   Visual Acuity Right Eye Distance:   Left Eye Distance:   Bilateral Distance:    Right Eye Near:   Left Eye Near:    Bilateral Near:     Physical Exam Constitutional:      General: She is not in acute distress.    Appearance: She is well-developed.  Cardiovascular:     Rate and Rhythm: Normal rate and regular rhythm.  Pulmonary:     Effort: Pulmonary effort is normal.     Breath sounds: Wheezing present.  Skin:    General: Skin is warm and dry.  Neurological:     Mental Status: She is alert and oriented to person, place, and time.      UC Treatments / Results  Labs (all labs ordered are listed, but only abnormal results are displayed) Labs Reviewed  SARS CORONAVIRUS 2 (TAT 6-24 HRS)    EKG   Radiology No results found.  Procedures Procedures (including critical care time)  Medications Ordered in UC Medications - No data to display  Initial Impression / Assessment and Plan / UC Course  I have reviewed the triage vital signs and the nursing notes.  Pertinent labs & imaging results that were available during my care of the patient were reviewed by me and considered in my medical decision making (see chart for details).      History of asthma and allergies. Suspect viral illness which is triggering asthma. Prednisone provided.  covid testing collected and pending. Return precautions provided. Patient verbalized understanding and agreeable to plan.   Final Clinical Impressions(s) / UC Diagnoses   Final diagnoses:  Mild intermittent asthma with exacerbation  Acute upper respiratory infection     Discharge Instructions     Push fluids to ensure adequate hydration and keep secretions thin.  Tylenol and/or ibuprofen as needed for pain or fevers.  Use of inhaler as needed for wheezing or shortness of breath.   Prednisone dose pack.  Mucinex d for example may help as an expectorant. Self isolate until covid results are back and negative.  Will notify you by phone of any positive findings. Your negative results will be sent through your MyChart.     If symptoms worsen or do not improve in the next week to return to be seen or to follow up with your PCP.     ED Prescriptions    Medication Sig Dispense Auth. Provider   predniSONE (DELTASONE) 20 MG tablet Take 2 tablets (40 mg total) by mouth daily with breakfast for 5 days. 10 tablet Augusto Gamble B, NP   benzonatate (TESSALON) 100 MG capsule Take 1 capsule (100 mg total) by mouth every 8 (eight) hours. 21 capsule Augusto Gamble B, NP   albuterol (VENTOLIN HFA) 108 (90 Base) MCG/ACT inhaler Inhale 1-2 puffs into the lungs every 6 (six) hours as needed for wheezing or shortness of breath. 18 g Zigmund Gottron, NP     PDMP not reviewed this encounter.   Zigmund Gottron, NP 04/22/20 [phone]

## 2020-04-23 ENCOUNTER — Other Ambulatory Visit: Payer: Self-pay

## 2020-04-23 ENCOUNTER — Ambulatory Visit (HOSPITAL_COMMUNITY)
Admission: EM | Admit: 2020-04-23 | Discharge: 2020-04-23 | Disposition: A | Discharge status: Home / Self Care | Payer: Self-pay | Attending: Family Medicine | Admitting: Family Medicine

## 2020-04-23 ENCOUNTER — Encounter (HOSPITAL_COMMUNITY): Payer: Self-pay

## 2020-04-23 DIAGNOSIS — R059 Cough, unspecified: Secondary | ICD-10-CM

## 2020-04-23 DIAGNOSIS — J0100 Acute maxillary sinusitis, unspecified: Secondary | ICD-10-CM

## 2020-04-23 DIAGNOSIS — J4541 Moderate persistent asthma with (acute) exacerbation: Secondary | ICD-10-CM

## 2020-04-23 MED ORDER — HYDROCODONE-HOMATROPINE 5-1.5 MG/5ML PO SYRP: 5.0000 mL | ORAL_SOLUTION | Freq: Four times a day (QID) | ORAL | 0 refills | Status: DC | PRN

## 2020-04-23 MED ORDER — CEFDINIR 300 MG PO CAPS
300.0000 mg | ORAL_CAPSULE | Freq: Two times a day (BID) | ORAL | 0 refills | Status: DC
Start: 2020-04-23 — End: 2021-10-31

## 2020-04-23 MED ORDER — DEXAMETHASONE SODIUM PHOSPHATE 10 MG/ML IJ SOLN
10.0000 mg | Freq: Once | INTRAMUSCULAR | Status: AC
  Administered 2020-04-23: 10 mg via INTRAMUSCULAR

## 2020-04-23 MED ORDER — ALBUTEROL SULFATE (2.5 MG/3ML) 0.083% IN NEBU
2.5000 mg | INHALATION_SOLUTION | Freq: Four times a day (QID) | RESPIRATORY_TRACT | 12 refills | Status: DC | PRN
Start: 2020-04-23 — End: 2021-08-28

## 2020-04-23 MED ORDER — DEXAMETHASONE SODIUM PHOSPHATE 10 MG/ML IJ SOLN
INTRAMUSCULAR | Status: AC
  Filled 2020-04-23: qty 1

## 2020-04-23 NOTE — ED Triage Notes (Signed)
Pt c/o coughing and SOB x 5 days. Seen here 2 days ago for same but states not better after treatment. Non productive cough

## 2020-04-23 NOTE — ED Provider Notes (Signed)
[place]   [id number] 04/23/20 Arrival Time: 0263  ASSESSMENT & PLAN:  1. Moderate persistent asthma with exacerbation   2. Cough   3. Acute non-recurrent maxillary sinusitis     Meds ordered this encounter  Medications  . dexamethasone (DECADRON) injection 10 mg  . albuterol (PROVENTIL) (2.5 MG/3ML) 0.083% nebulizer solution    Sig: Take 3 mLs (2.5 mg total) by nebulization every 6 (six) hours as needed for wheezing or shortness of breath.    Dispense:  75 mL    Refill:  12  . cefdinir (OMNICEF) 300 MG capsule    Sig: Take 1 capsule (300 mg total) by mouth 2 (two) times daily.    Dispense:  20 capsule    Refill:  0  . HYDROcodone-homatropine (HYCODAN) 5-1.5 MG/5ML syrup    Sig: Take 5 mLs by mouth every 6 (six) hours as needed for cough.    Dispense:  90 mL    Refill:  0    Discussed typical duration of symptoms. OTC symptom care as needed. Ensure adequate fluid intake and rest.   Follow-up Information    [facility].   Specialty: Urgent Care Why: If worsening or failing to improve as anticipated. Contact information: [address] [place] [phone]              Reviewed expectations re: course of current medical issues. Questions answered. Outlined signs and symptoms indicating need for more acute intervention. Patient verbalized understanding. After Visit Summary given.   SUBJECTIVE: History from: patient. Seen here 04/20/2020; note reviewed. Has been taking Rx medicines. Mild help.  Rebecca Velasquez is a 34 y.o. female who presents with complaint of continued wheezing and increasing sinus pain/pressure; nasal congestion for over one week; wheezing 5-6 days. Active cough; affecting sleep. Afebrile. No SOB reported. Overall normal PO intake without n/v.  No specific aggravating or alleviating factors reported.  Social History   Tobacco Use  Smoking Status Current Every Day Smoker  .  Packs/day: 0.50  Smokeless Tobacco Never Used     OBJECTIVE:  Vitals:   04/23/20 1003  BP: (!) 128/59  Pulse: 55  Resp: 16  Temp: 98.6 F (37 C)  TempSrc: Oral  SpO2: 96%     General appearance: alert; no distress HEENT: nasal congestion; clear runny nose; throat irritation secondary to post-nasal drainage; bilateral maxillary tenderness to palpation; turbinates boggy Neck: supple without LAD; trachea midline Lungs: unlabored respirations, symmetrical air entry; cough: moderate; no respiratory distress; bilateral exp wheezing Skin: warm and dry Psychological: alert and cooperative; normal mood and affect  No Known Allergies  Past Medical History:  Diagnosis Date  . Asthma   . Celiac disease   . Depression   . Endometriosis    Family History  Problem Relation Age of Onset  . Rheum arthritis Mother   . Cancer Mother   . Diabetes Father   . Cancer Father    Social History   Socioeconomic History  . Marital status: Single    Spouse name: Not on file  . Number of children: Not on file  . Years of education: Not on file  . Highest education level: Not on file  Occupational History  . Not on file  Tobacco Use  . Smoking status: Current Every Day Smoker    Packs/day: 0.50  . Smokeless tobacco: Never Used  Vaping Use  . Vaping Use: Never used  Substance and Sexual Activity  .  Alcohol use: Never  . Drug use: Never  . Sexual activity: Not on file  Other Topics Concern  . Not on file  Social History Narrative  . Not on file   Social Determinants of Health   Financial Resource Strain:   . Difficulty of Paying Living Expenses:   Food Insecurity:   . Worried About Charity fundraiser in the Last Year:   . Arboriculturist in the Last Year:   Transportation Needs:   . Film/video editor (Medical):   Marland Kitchen Lack of Transportation (Non-Medical):   Physical Activity:   . Days of Exercise per Week:   . Minutes of Exercise per Session:   Stress:   . Feeling of  Stress :   Social Connections:   . Frequency of Communication with Friends and Family:   . Frequency of Social Gatherings with Friends and Family:   . Attends Religious Services:   . Active Member of Clubs or Organizations:   . Attends Archivist Meetings:   Marland Kitchen Marital Status:   Intimate Partner Violence:   . Fear of Current or Ex-Partner:   . Emotionally Abused:   Marland Kitchen Physically Abused:   . Sexually Abused:             Vanessa Kick, MD 04/23/20 1044

## 2020-04-29 ENCOUNTER — Ambulatory Visit (HOSPITAL_COMMUNITY): Payer: No Payment, Other | Admitting: Clinical

## 2020-04-29 ENCOUNTER — Other Ambulatory Visit: Payer: Self-pay

## 2020-05-21 ENCOUNTER — Other Ambulatory Visit: Payer: Self-pay

## 2020-05-21 ENCOUNTER — Telehealth (HOSPITAL_COMMUNITY): Payer: No Payment, Other

## 2020-07-08 ENCOUNTER — Encounter (HOSPITAL_COMMUNITY): Payer: Self-pay | Admitting: Psychiatry

## 2020-07-08 ENCOUNTER — Telehealth (INDEPENDENT_AMBULATORY_CARE_PROVIDER_SITE_OTHER): Payer: No Payment, Other | Admitting: Psychiatry

## 2020-07-08 ENCOUNTER — Other Ambulatory Visit: Payer: Self-pay

## 2020-07-08 DIAGNOSIS — F330 Major depressive disorder, recurrent, mild: Secondary | ICD-10-CM | POA: Insufficient documentation

## 2020-07-08 DIAGNOSIS — F4310 Post-traumatic stress disorder, unspecified: Secondary | ICD-10-CM

## 2020-07-08 DIAGNOSIS — F411 Generalized anxiety disorder: Secondary | ICD-10-CM | POA: Diagnosis not present

## 2020-07-08 MED ORDER — CLONIDINE HCL 0.1 MG PO TABS: 0.1000 mg | ORAL_TABLET | Freq: Three times a day (TID) | ORAL | 2 refills | Status: DC | PRN

## 2020-07-08 MED ORDER — HYDROXYZINE HCL 25 MG PO TABS: 25.0000 mg | ORAL_TABLET | Freq: Three times a day (TID) | ORAL | 2 refills | Status: DC | PRN

## 2020-07-08 MED ORDER — BUSPIRONE HCL 10 MG PO TABS: 10.0000 mg | ORAL_TABLET | Freq: Three times a day (TID) | ORAL | 2 refills | Status: DC

## 2020-07-08 MED ORDER — TRAZODONE HCL 100 MG PO TABS: 100.0000 mg | ORAL_TABLET | Freq: Every day | ORAL | 2 refills | Status: DC

## 2020-07-08 MED ORDER — VORTIOXETINE HBR 20 MG PO TABS: 20.0000 mg | ORAL_TABLET | Freq: Every day | ORAL | 2 refills | Status: DC

## 2020-07-08 MED ORDER — PRAZOSIN HCL 1 MG PO CAPS: 1.0000 mg | ORAL_CAPSULE | Freq: Every day | ORAL | 2 refills | Status: DC

## 2020-07-08 NOTE — Progress Notes (Signed)
Psychiatric Initial Adult Assessment  Virtual Visit via Video Note  I connected with Rebecca Velasquez on 07/08/20 at  8:15 AM EDT by a video enabled telemedicine application and verified that I am speaking with the correct person using two identifiers.  Location: Patient: Home Provider: Clinic   I discussed the limitations of evaluation and management by telemedicine and the availability of in person appointments. The patient expressed understanding and agreed to proceed.  I provided 45 minutes of non-face-to-face time during this encounter.    Patient Identification: Rebecca Velasquez MRN:  [id number] Date of Evaluation:  07/08/2020 Referral Source: [facility] Chief Complaint:    "I have been out of some medications for a month"  Visit Diagnosis:    ICD-10-CM   1. Generalized anxiety disorder  F41.1 hydrOXYzine (ATARAX/VISTARIL) 25 MG tablet    traZODone (DESYREL) 100 MG tablet    cloNIDine (CATAPRES) 0.1 MG tablet    busPIRone (BUSPAR) 10 MG tablet    Ambulatory referral to Social Work  2. Mild episode of recurrent major depressive disorder (HCC)  F33.0 traZODone (DESYREL) 100 MG tablet    vortioxetine HBr (TRINTELLIX) 20 MG TABS tablet    busPIRone (BUSPAR) 10 MG tablet    Ambulatory referral to Social Work  3. PTSD (post-traumatic stress disorder)  F43.10 prazosin (MINIPRESS) 1 MG capsule    Ambulatory referral to Social Work    History of Present Illness: 34 year old female seen today for initial psychiatric evaluation.  She was referred to outpatient psychiatry by [facility] for medication management.  She has a psychiatric history of depression, PTSD, cannabis use disorder, and nicotine dependence.  She is currently being managed on hydroxyzine 10 mg 3 times daily, trazodone 100 mg nightly, and clonidine 0.1 mg 3 times daily as needed. She notes that her medications are somewhat effective in managing her psychiatric conditions.    Today she is well-groomed, pleasant, cooperative,  engaged in conversation, and maintains eye contact.  She describes her mood as anxious.  She quantifies her anxiety as 9 out of 10 and her depression as 7 out of 10 (10 being severe anxiety and depression).  Notes that she has been out some of her medications for a month and now is feeling more anxious and depressed. She endorses depressive symptoms such as anhedonia, feelings of worthlessness, difficulty concentrating, impaired memory, anxiety, decreased appetite, decreased weight (losing 10 pounds in a month), and passive SI.  She notes that at times she thinks about hurting herself however denies having a plan.  Today she contracts for safety and denies SI/HI/AVH.  Patient informed provider that at times she is paranoid to being in a new home.  She states that her neighborhood is not the safest.  She informed provider that 2 days ago she had a panic attack after being approached by a neighbor who she felt was unsafe.  Patient notes that within a 5-year span she lost several family members which she notes was traumatic.  She informed Probation officer that he found her brother dead after he overdosed on heroin.  She also notes that her mother, aunt, father, and grandmother died within that time.  She informed Probation officer that she has flashbacks, intrusive thoughts, and nightmares, surrounding the above.  Patient informed Probation officer that she smokes marijuana daily to help manage her anxiety.  She notes that she takes it before bed.  Provider informed patient that although marijuana may have some calming effects it can also exacerbate her anxiety and cause paranoia.  She endorsed understanding  and agreed.  Provider educated patient on sensory techniques, grounding techniques, and deep breathing exercises to help control her anxiety.  She is agreeable to increasing hydroxyzine 10 mg 3 times a day to 25 mg 3 times a day as needed for anxiety.  She will also start Buspar10 mg 3 times a day to help manage anxiety.  She is also  agreeable to starting prazosin 1 mg nightly to help manage nightmares.  Potential side effects of medication and risks vs benefits of treatment vs non-treatment were explained and discussed. All questions were answered.  She will follow-up with a outpatient counselor for therapy.  She will continue all other medications as prescribed.  Associated Signs/Symptoms: Depression Symptoms:  depressed mood, anhedonia, feelings of worthlessness/guilt, difficulty concentrating, hopelessness, impaired memory, suicidal thoughts without plan, anxiety, panic attacks, disturbed sleep, weight loss, decreased appetite, (Hypo) Manic Symptoms:  Distractibility, Flight of Ideas, Irritable Mood, Anxiety Symptoms:  Excessive Worry, Panic Symptoms, Psychotic Symptoms:  Paranoia, PTSD Symptoms: Had a traumatic exposure:  Notes that she lossed her grandmother, father, aunt, and mother in a five year span Re-experiencing:  Flashbacks Intrusive Thoughts Nightmares  Past Psychiatric History: PTSD, depression, cannabis use disorder, and nicotine dependence.  Previous Psychotropic Medications: Has tried seroquel, cymbalta, trintellix, hydroxyzine, trazodone  Substance Abuse History in the last 12 months:  Yes.    Consequences of Substance Abuse: NA  Past Medical History:  Past Medical History:  Diagnosis Date  . Asthma   . Celiac disease   . Depression   . Endometriosis     Past Surgical History:  Procedure Laterality Date  . GASTRIC BYPASS OPEN  2016  . IVC FILTER INSERTION  2016    Family Psychiatric History: Notes brother OD on heroin. He sister has PTSD, Depression, and anxiety  Family History:  Family History  Problem Relation Age of Onset  . Rheum arthritis Mother   . Cancer Mother   . Diabetes Father   . Cancer Father     Social History:   Social History   Socioeconomic History  . Marital status: Single    Spouse name: Not on file  . Number of children: Not on file  .  Years of education: Not on file  . Highest education level: Not on file  Occupational History  . Not on file  Tobacco Use  . Smoking status: Current Every Day Smoker    Packs/day: 0.50  . Smokeless tobacco: Never Used  Vaping Use  . Vaping Use: Never used  Substance and Sexual Activity  . Alcohol use: Never  . Drug use: Never  . Sexual activity: Not on file  Other Topics Concern  . Not on file  Social History Narrative  . Not on file   Social Determinants of Health   Financial Resource Strain:   . Difficulty of Paying Living Expenses: Not on file  Food Insecurity:   . Worried About Charity fundraiser in the Last Year: Not on file  . Ran Out of Food in the Last Year: Not on file  Transportation Needs:   . Lack of Transportation (Medical): Not on file  . Lack of Transportation (Non-Medical): Not on file  Physical Activity:   . Days of Exercise per Week: Not on file  . Minutes of Exercise per Session: Not on file  Stress:   . Feeling of Stress : Not on file  Social Connections:   . Frequency of Communication with Friends and Family: Not on file  .  Frequency of Social Gatherings with Friends and Family: Not on file  . Attends Religious Services: Not on file  . Active Member of Clubs or Organizations: Not on file  . Attends Archivist Meetings: Not on file  . Marital Status: Not on file    Additional Social History: Patient resides in [place] with her sister.  She is single and has no children.  She notes that she works at [employer] (for over 2 years).  She endorses smoking a pack of cigarettes a day and daily marijuana use.  She denies alcohol or other illegal drug use.  Allergies:  No Known Allergies  Metabolic Disorder Labs: No results found for: HGBA1C, MPG No results found for: PROLACTIN No results found for: CHOL, TRIG, HDL, CHOLHDL, VLDL, LDLCALC No results found for: TSH  Therapeutic Level Labs: No results found for: LITHIUM No results found  for: CBMZ No results found for: VALPROATE  Current Medications: Current Outpatient Medications  Medication Sig Dispense Refill  . albuterol (PROVENTIL) (2.5 MG/3ML) 0.083% nebulizer solution Take 3 mLs (2.5 mg total) by nebulization every 6 (six) hours as needed for wheezing or shortness of breath. 75 mL 12  . albuterol (VENTOLIN HFA) 108 (90 Base) MCG/ACT inhaler Inhale 1-2 puffs into the lungs every 6 (six) hours as needed for wheezing or shortness of breath. 18 g 0  . benzonatate (TESSALON) 100 MG capsule Take 1 capsule (100 mg total) by mouth every 8 (eight) hours. 21 capsule 0  . busPIRone (BUSPAR) 10 MG tablet Take 1 tablet (10 mg total) by mouth 3 (three) times daily. 90 tablet 2  . cefdinir (OMNICEF) 300 MG capsule Take 1 capsule (300 mg total) by mouth 2 (two) times daily. 20 capsule 0  . cloNIDine (CATAPRES) 0.1 MG tablet Take 1 tablet (0.1 mg total) by mouth 3 (three) times daily as needed (anxiety). 90 tablet 2  . fluticasone (FLONASE) 50 MCG/ACT nasal spray Place 1 spray into both nostrils daily. 16 g 2  . HYDROcodone-homatropine (HYCODAN) 5-1.5 MG/5ML syrup Take 5 mLs by mouth every 6 (six) hours as needed for cough. 90 mL 0  . hydrOXYzine (ATARAX/VISTARIL) 25 MG tablet Take 1 tablet (25 mg total) by mouth 3 (three) times daily as needed for anxiety. 90 tablet 2  . ibuprofen (ADVIL,MOTRIN) 200 MG tablet Take 800 mg by mouth every 6 (six) hours as needed (back pain).    . Omeprazole 20 MG TBEC Take 20 mg by mouth daily as needed (acid reflux).     . prazosin (MINIPRESS) 1 MG capsule Take 1 capsule (1 mg total) by mouth at bedtime. 30 capsule 2  . traZODone (DESYREL) 100 MG tablet Take 1 tablet (100 mg total) by mouth at bedtime. 30 tablet 2  . vortioxetine HBr (TRINTELLIX) 20 MG TABS tablet Take 1 tablet (20 mg total) by mouth daily. 2 tablet 2   No current facility-administered medications for this visit.    Musculoskeletal: Strength & Muscle Tone: Unable to assess due to  telehealth visit [place]: Unable to assess due to telehealth visit Patient leans: N/A  Psychiatric Specialty Exam: Review of Systems  There were no vitals taken for this visit.There is no height or weight on file to calculate BMI.  General Appearance: Well Groomed  Eye Contact:  Good  Speech:  Clear and Coherent and Normal Rate  Volume:  Normal  Mood:  Anxious and Depressed  Affect:  Appropriate and Congruent  Thought Process:  Coherent, Goal Directed  and Linear  Orientation:  Full (Time, Place, and Person)  Thought Content:  WDL and Logical  Suicidal Thoughts:  Yes.  without intent/plan  Homicidal Thoughts:  No  Memory:  Immediate;   Good Recent;   Good Remote;   Good  Judgement:  Good  Insight:  Good  Psychomotor Activity:  Normal  Concentration:  Concentration: Good and Attention Span: Good  Recall:  Good  Fund of Knowledge:Good  Language: Good  Akathisia:  No  Handed:  Right  AIMS (if indicated):Not done  Assets:  Communication Skills Desire for Improvement Financial Resources/Insurance Housing Social Support  ADL's:  Intact  Cognition: WNL  Sleep:  Good   Screenings:   Assessment and Plan: Patient endorses symptoms of anxiety, depression, PTSD and passive SI.  She contracts for safety today.  She is agreeable to increasing hydroxyzine 47m to 25 mg 3 times a day as needed for anxiety.  She is also agreeable to start BuSpar 10 mg 3 times daily to help manage anxiety and depression.  She will also start prazosin 1 mg nightly to help manage nightmares.  She will continue all other medications as prescribed.  1. Generalized anxiety disorder  Increased- hydrOXYzine (ATARAX/VISTARIL) 25 MG tablet; Take 1 tablet (25 mg total) by mouth 3 (three) times daily as needed for anxiety.  Dispense: 90 tablet; Refill: 2 Continue- traZODone (DESYREL) 100 MG tablet; Take 1 tablet (100 mg total) by mouth at bedtime.  Dispense: 30 tablet; Refill: 2 Continue- cloNIDine  (CATAPRES) 0.1 MG tablet; Take 1 tablet (0.1 mg total) by mouth 3 (three) times daily as needed (anxiety).  Dispense: 90 tablet; Refill: 2 Start- busPIRone (BUSPAR) 10 MG tablet; Take 1 tablet (10 mg total) by mouth 3 (three) times daily.  Dispense: 90 tablet; Refill: 2 - Ambulatory referral to Social Work  2. Mild episode of recurrent major depressive disorder (HCC)  Continue- traZODone (DESYREL) 100 MG tablet; Take 1 tablet (100 mg total) by mouth at bedtime.  Dispense: 30 tablet; Refill: 2 Continue- vortioxetine HBr (TRINTELLIX) 20 MG TABS tablet; Take 1 tablet (20 mg total) by mouth daily.  Dispense: 2 tablet; Refill: 2 Start- busPIRone (BUSPAR) 10 MG tablet; Take 1 tablet (10 mg total) by mouth 3 (three) times daily.  Dispense: 90 tablet; Refill: 2 - Ambulatory referral to Social Work  3. PTSD (post-traumatic stress disorder)  Start- prazosin (MINIPRESS) 1 MG capsule; Take 1 capsule (1 mg total) by mouth at bedtime.  Dispense: 30 capsule; Refill: 2 - Ambulatory referral to Social Work  Follow-up in 2 months Follow-up with therapy  BSalley Slaughter NP 10/11/20218:47 AM

## 2020-07-15 ENCOUNTER — Ambulatory Visit (HOSPITAL_COMMUNITY)
Admission: EM | Admit: 2020-07-15 | Discharge: 2020-07-15 | Disposition: A | Discharge status: Home / Self Care | Payer: Self-pay | Attending: Urgent Care | Admitting: Urgent Care

## 2020-07-15 ENCOUNTER — Encounter (HOSPITAL_COMMUNITY): Payer: Self-pay

## 2020-07-15 ENCOUNTER — Other Ambulatory Visit: Payer: Self-pay

## 2020-07-15 DIAGNOSIS — M545 Low back pain, unspecified: Secondary | ICD-10-CM

## 2020-07-15 MED ORDER — KETOROLAC TROMETHAMINE 60 MG/2ML IM SOLN
60.0000 mg | Freq: Once | INTRAMUSCULAR | Status: AC
  Administered 2020-07-15: 60 mg via INTRAMUSCULAR

## 2020-07-15 MED ORDER — NAPROXEN 500 MG PO TABS
500.0000 mg | ORAL_TABLET | Freq: Two times a day (BID) | ORAL | 0 refills | Status: DC
Start: 2020-07-15 — End: 2020-09-05

## 2020-07-15 MED ORDER — KETOROLAC TROMETHAMINE 60 MG/2ML IM SOLN
INTRAMUSCULAR | Status: AC
  Filled 2020-07-15: qty 2

## 2020-07-15 MED ORDER — TIZANIDINE HCL 4 MG PO TABS: 4.0000 mg | ORAL_TABLET | Freq: Three times a day (TID) | ORAL | 0 refills | Status: DC | PRN

## 2020-07-15 NOTE — ED Provider Notes (Signed)
[place]   MRN: [id number] DOB: 11/26/1985  Subjective:   Rebecca Velasquez is a 34 y.o. female presenting for 3-day history of right-sided lower back pain that radiates down into the buttocks and hip.  Patient denies fall, trauma, weakness.  Denies incontinence.  Patient states that she works long hours and puts a lot of burden on the right side of her hip and low back.  Has tried Tylenol and Advil with minimal relief.  Has previously responded well to stronger NSAID, muscle relaxant and Toradol.  She is requesting the same today.  No current facility-administered medications for this encounter.  Current Outpatient Medications:    albuterol (VENTOLIN HFA) 108 (90 Base) MCG/ACT inhaler, Inhale 1-2 puffs into the lungs every 6 (six) hours as needed for wheezing or shortness of breath., Disp: 18 g, Rfl: 0   busPIRone (BUSPAR) 10 MG tablet, Take 1 tablet (10 mg total) by mouth 3 (three) times daily., Disp: 90 tablet, Rfl: 2   cloNIDine (CATAPRES) 0.1 MG tablet, Take 1 tablet (0.1 mg total) by mouth 3 (three) times daily as needed (anxiety)., Disp: 90 tablet, Rfl: 2   fluticasone (FLONASE) 50 MCG/ACT nasal spray, Place 1 spray into both nostrils daily., Disp: 16 g, Rfl: 2   hydrOXYzine (ATARAX/VISTARIL) 25 MG tablet, Take 1 tablet (25 mg total) by mouth 3 (three) times daily as needed for anxiety., Disp: 90 tablet, Rfl: 2   ibuprofen (ADVIL,MOTRIN) 200 MG tablet, Take 800 mg by mouth every 6 (six) hours as needed (back pain)., Disp: , Rfl:    Omeprazole 20 MG TBEC, Take 20 mg by mouth daily as needed (acid reflux). , Disp: , Rfl:    prazosin (MINIPRESS) 1 MG capsule, Take 1 capsule (1 mg total) by mouth at bedtime., Disp: 30 capsule, Rfl: 2   traZODone (DESYREL) 100 MG tablet, Take 1 tablet (100 mg total) by mouth at bedtime., Disp: 30 tablet, Rfl: 2   vortioxetine HBr (TRINTELLIX) 20 MG TABS tablet, Take 1 tablet (20 mg total) by mouth daily., Disp: 30 tablet, Rfl:  2   albuterol (PROVENTIL) (2.5 MG/3ML) 0.083% nebulizer solution, Take 3 mLs (2.5 mg total) by nebulization every 6 (six) hours as needed for wheezing or shortness of breath., Disp: 75 mL, Rfl: 12   benzonatate (TESSALON) 100 MG capsule, Take 1 capsule (100 mg total) by mouth every 8 (eight) hours., Disp: 21 capsule, Rfl: 0   cefdinir (OMNICEF) 300 MG capsule, Take 1 capsule (300 mg total) by mouth 2 (two) times daily., Disp: 20 capsule, Rfl: 0   HYDROcodone-homatropine (HYCODAN) 5-1.5 MG/5ML syrup, Take 5 mLs by mouth every 6 (six) hours as needed for cough., Disp: 90 mL, Rfl: 0   No Known Allergies  Past Medical History:  Diagnosis Date   Asthma    Celiac disease    Depression    Endometriosis      Past Surgical History:  Procedure Laterality Date   GASTRIC BYPASS OPEN  2016   IVC FILTER INSERTION  2016    Family History  Problem Relation Age of Onset   Rheum arthritis Mother    Cancer Mother    Diabetes Father    Cancer Father     Social History   Tobacco Use   Smoking status: Current Every Day Smoker    Packs/day: 0.50   Smokeless tobacco: Never Used  Vaping Use   Vaping Use: Never used  Substance Use Topics   Alcohol use: Never  Drug use: Never    ROS   Objective:   Vitals: BP 130/82 (BP Location: Right Arm)    Pulse 62    Temp 99.5 F (37.5 C) (Oral)    Resp 19    LMP 07/01/2020 (Approximate)    SpO2 96%   Physical Exam Constitutional:      General: She is not in acute distress.    Appearance: Normal appearance. She is well-developed. She is not ill-appearing, toxic-appearing or diaphoretic.  HENT:     Head: Normocephalic and atraumatic.     Nose: Nose normal.     Mouth/Throat:     Mouth: Mucous membranes are moist.     Pharynx: Oropharynx is clear.  Eyes:     General: No scleral icterus.       Right eye: No discharge.        Left eye: No discharge.     Extraocular Movements: Extraocular movements intact.      Conjunctiva/sclera: Conjunctivae normal.     Pupils: Pupils are equal, round, and reactive to light.  Cardiovascular:     Rate and Rhythm: Normal rate.  Pulmonary:     Effort: Pulmonary effort is normal.  Musculoskeletal:     Lumbar back: Spasms and tenderness (right sided) present. No swelling, edema, deformity, signs of trauma, lacerations or bony tenderness. Normal range of motion. Negative right straight leg raise test and negative left straight leg raise test. No scoliosis.       Back:     Comments: Strength 5/5 for lower extremities.  Full range of motion.  No rash.  Skin:    General: Skin is warm and dry.  Neurological:     General: No focal deficit present.     Mental Status: She is alert and oriented to person, place, and time.     Motor: No weakness.     Coordination: Coordination normal.     Gait: Gait normal.     Deep Tendon Reflexes: Reflexes normal.  Psychiatric:        Mood and Affect: Mood normal.        Behavior: Behavior normal.        Thought Content: Thought content normal.        Judgment: Judgment normal.     Assessment and Plan :   PDMP not reviewed this encounter.  1. Acute right-sided low back pain without sciatica     Recommended conservative management with NSAID, muscle relaxant and rest.  IM Toradol given in clinic.  Deferred imaging given lack of trauma or physical exam findings warranting x-rays. Counseled patient on potential for adverse effects with medications prescribed/recommended today, ER and return-to-clinic precautions discussed, patient verbalized understanding.    Jaynee Eagles, [place] 07/16/20 [phone]

## 2020-07-15 NOTE — ED Triage Notes (Signed)
Pt c/o lower back pain radiating down right buttocks/hip into right groin for approx 3 days. Pt denies any known trauma/injury. States she stands for long periods of time at work and places weight on right hip/foot.  Denies numbness to feet/hands. Took tylenol at 1600 and advil at 1400 with only minimal improvement in pain.

## 2020-08-19 ENCOUNTER — Ambulatory Visit (HOSPITAL_COMMUNITY): Payer: No Payment, Other | Admitting: Clinical

## 2020-08-19 ENCOUNTER — Other Ambulatory Visit: Payer: Self-pay

## 2020-09-05 ENCOUNTER — Encounter (HOSPITAL_COMMUNITY): Payer: Self-pay | Admitting: Psychiatry

## 2020-09-05 ENCOUNTER — Other Ambulatory Visit: Payer: Self-pay

## 2020-09-05 ENCOUNTER — Telehealth (INDEPENDENT_AMBULATORY_CARE_PROVIDER_SITE_OTHER): Payer: No Payment, Other | Admitting: Psychiatry

## 2020-09-05 DIAGNOSIS — F411 Generalized anxiety disorder: Secondary | ICD-10-CM | POA: Diagnosis not present

## 2020-09-05 DIAGNOSIS — F4310 Post-traumatic stress disorder, unspecified: Secondary | ICD-10-CM | POA: Diagnosis not present

## 2020-09-05 DIAGNOSIS — F330 Major depressive disorder, recurrent, mild: Secondary | ICD-10-CM

## 2020-09-05 MED ORDER — BUSPIRONE HCL 10 MG PO TABS: 10.0000 mg | ORAL_TABLET | Freq: Three times a day (TID) | ORAL | 2 refills | Status: DC

## 2020-09-05 MED ORDER — VORTIOXETINE HBR 20 MG PO TABS: 20.0000 mg | ORAL_TABLET | Freq: Every day | ORAL | 2 refills | Status: DC

## 2020-09-05 MED ORDER — PRAZOSIN HCL 1 MG PO CAPS: 1.0000 mg | ORAL_CAPSULE | Freq: Every day | ORAL | 2 refills | Status: DC

## 2020-09-05 MED ORDER — CLONIDINE HCL 0.1 MG PO TABS: 0.1000 mg | ORAL_TABLET | Freq: Three times a day (TID) | ORAL | 2 refills | Status: DC | PRN

## 2020-09-05 MED ORDER — TRAZODONE HCL 100 MG PO TABS: 100.0000 mg | ORAL_TABLET | Freq: Every day | ORAL | 2 refills | Status: DC

## 2020-09-05 MED ORDER — HYDROXYZINE HCL 25 MG PO TABS: 25.0000 mg | ORAL_TABLET | Freq: Three times a day (TID) | ORAL | 2 refills | Status: DC | PRN

## 2020-09-05 NOTE — Progress Notes (Signed)
BH MD/PA/NP OP Progress Note Virtual Visit via Video Note  I connected with [name] on 09/05/20 at  3:00 PM EST by a video enabled telemedicine application and verified that I am speaking with the correct person using two identifiers.  Location: Patient: Home Provider: Clinic   I discussed the limitations of evaluation and management by telemedicine and the availability of in person appointments. The patient expressed understanding and agreed to proceed.  I provided 30 minutes of non-face-to-face time during this encounter.    09/05/2020 3:12 PM Rebecca Velasquez  MRN:  [id number]  Chief Complaint: "Things have been good"  HPI: 34 year old female seen today for follow up psychiatric evaluation.  She has a psychiatric history of depression, PTSD, cannabis use disorder, and nicotine dependence.  She is currently being managed on hydroxyzine 25 mg 3 times daily, trazodone 100 mg nightly, prazosin 1 mg nightly, Buspar 10 mg three times daily, Trintellix 20 mg daily, and clonidine 0.1 mg 3 times daily as needed. She notes that her medications are  effective in managing her psychiatric conditions.    Today she is well-groomed, pleasant, cooperative, engaged in conversation, and maintains eye contact.  She informed provider that she was preparing to go to work.  She notes that her anxiety and depression has improved since her last visit.  She notes that she is unaware if medications are improving her mental health conditions or just being in a better place.  She informed provider that she started a new relationship for a month and notes that it is going well.  Today provider conducted a GAD-7 and patient scored a 7.  Provider also conducted a PHQ-9 and patient scored an 8.  She denies SI/HI/VAH or paranoia.  Patient also notes that since starting prazosin she no longer has nightmares.  She also reports that her sleep has improved noting that she now is between 6 to 7 hours nightly.  No  medication changes made today.  Patient agreeable to continue medications as prescribed.  She will follow up with outpatient counseling for therapy.  No other concerns at this time.    Visit Diagnosis:    ICD-10-CM   1. 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 PTSD (post-traumatic stress disorder)  F43.10 prazosin (MINIPRESS) 1 MG capsule    Past Psychiatric History: depression, PTSD, cannabis use disorder, and nicotine dependence  Past Medical History:  Past Medical History:  Diagnosis Date  . Asthma   . Celiac disease   . Depression   . Endometriosis     Past Surgical History:  Procedure Laterality Date  . GASTRIC BYPASS OPEN  2016  . IVC FILTER INSERTION  2016    Family Psychiatric History: Notes brother OD on heroin. He sister has PTSD, Depression, and anxiety  Family History:  Family History  Problem Relation Age of Onset  . Rheum arthritis Mother   . Cancer Mother   . Diabetes Father   . Cancer Father     Social History:  Social History   Socioeconomic History  . Marital status: Single    Spouse name: Not on file  . Number  of children: Not on file  . Years of education: Not on file  . Highest education level: Not on file  Occupational History  . Not on file  Tobacco Use  . Smoking status: Current Every Day Smoker    Packs/day: 0.50  . Smokeless tobacco: Never Used  Vaping Use  . Vaping Use: Never used  Substance and Sexual Activity  . Alcohol use: Never  . Drug use: Never  . Sexual activity: Not on file  Other Topics Concern  . Not on file  Social History Narrative  . Not on file   Social Determinants of  Health   Financial Resource Strain: Not on file  Food Insecurity: Not on file  Transportation Needs: Not on file  Physical Activity: Not on file  Stress: Not on file  Social Connections: Not on file    Allergies: No Known Allergies  Metabolic Disorder Labs: No results found for: HGBA1C, MPG No results found for: PROLACTIN No results found for: CHOL, TRIG, HDL, CHOLHDL, VLDL, LDLCALC No results found for: TSH  Therapeutic Level Labs: No results found for: LITHIUM No results found for: VALPROATE No components found for:  CBMZ  Current Medications: Current Outpatient Medications  Medication Sig Dispense Refill  . albuterol (PROVENTIL) (2.5 MG/3ML) 0.083% nebulizer solution Take 3 mLs (2.5 mg total) by nebulization every 6 (six) hours as needed for wheezing or shortness of breath. 75 mL 12  . albuterol (VENTOLIN HFA) 108 (90 Base) MCG/ACT inhaler Inhale 1-2 puffs into the lungs every 6 (six) hours as needed for wheezing or shortness of breath. 18 g 0  . benzonatate (TESSALON) 100 MG capsule Take 1 capsule (100 mg total) by mouth every 8 (eight) hours. 21 capsule 0  . busPIRone (BUSPAR) 10 MG tablet Take 1 tablet (10 mg total) by mouth 3 (three) times daily. 90 tablet 2  . cefdinir (OMNICEF) 300 MG capsule Take 1 capsule (300 mg total) by mouth 2 (two) times daily. 20 capsule 0  . cloNIDine (CATAPRES) 0.1 MG tablet Take 1 tablet (0.1 mg total) by mouth 3 (three) times daily as needed (anxiety). 90 tablet 2  . fluticasone (FLONASE) 50 MCG/ACT nasal spray Place 1 spray into both nostrils daily. 16 g 2  . HYDROcodone-homatropine (HYCODAN) 5-1.5 MG/5ML syrup Take 5 mLs by mouth every 6 (six) hours as needed for cough. 90 mL 0  . hydrOXYzine (ATARAX/VISTARIL) 25 MG tablet Take 1 tablet (25 mg total) by mouth 3 (three) times daily as needed for anxiety. 90 tablet 2  . Omeprazole 20 MG TBEC Take 20 mg by mouth daily as needed (acid reflux).     . prazosin (MINIPRESS) 1 MG capsule Take 1  capsule (1 mg total) by mouth at bedtime. 30 capsule 2  . tiZANidine (ZANAFLEX) 4 MG tablet Take 1 tablet (4 mg total) by mouth every 8 (eight) hours as needed. 30 tablet 0  . traZODone (DESYREL) 100 MG tablet Take 1 tablet (100 mg total) by mouth at bedtime. 30 tablet 2  . vortioxetine HBr (TRINTELLIX) 20 MG TABS tablet Take 1 tablet (20 mg total) by mouth daily. 30 tablet 2   No current facility-administered medications for this visit.     Musculoskeletal: Strength & Muscle Tone: Unable to assess due to telehealth visit [place]: Unable to assess due to telehealth visit Patient leans: N/A  Psychiatric Specialty Exam: Review of Systems  There were no vitals taken for this visit.There is no height or weight on file to  calculate BMI.  General Appearance: Well Groomed  Eye Contact:  Good  Speech:  Clear and Coherent and Normal Rate  Volume:  Normal  Mood:  Euthymic  Affect:  Appropriate and Congruent  Thought Process:  Coherent, Goal Directed and Linear  Orientation:  Full (Time, Place, and Person)  Thought Content: WDL and Logical   Suicidal Thoughts:  No  Homicidal Thoughts:  No  Memory:  Immediate;   Good Recent;   Good Remote;   Good  Judgement:  Good  Insight:  Good  Psychomotor Activity:  Normal  Concentration:  Concentration: Good and Attention Span: Good  Recall:  Good  Fund of Knowledge: Good  Language: Good  Akathisia:  No  Handed:  Right  AIMS (if indicated):Not done  Assets:  Communication Skills Desire for Improvement Financial Resources/Insurance Housing Intimacy Social Support  ADL's:  Intact  Cognition: WNL  Sleep:  Good   Screenings: GAD-7   Flowsheet Row [facility]  Total GAD-7 Score 7    PHQ2-9   Flowsheet Row [facility]05/[facility]  PHQ-2 Total Score 2  PHQ-9 Total Score 8       Assessment and Plan: Patient notes that she  is doing well on her current medication regimen.No medication changes made today.  Patient agreeable to continue medications as prescribed.  No other concerns at this time.  1. Generalized anxiety disorder  Continue- busPIRone (BUSPAR) 10 MG tablet; Take 1 tablet (10 mg total) by mouth 3 (three) times daily.  Dispense: 90 tablet; Refill: 2 Continue- hydrOXYzine (ATARAX/VISTARIL) 25 MG tablet; Take 1 tablet (25 mg total) by mouth 3 (three) times daily as needed for anxiety.  Dispense: 90 tablet; Refill: 2 Continue- traZODone (DESYREL) 100 MG tablet; Take 1 tablet (100 mg total) by mouth at bedtime.  Dispense: 30 tablet; Refill: 2 Continue- cloNIDine (CATAPRES) 0.1 MG tablet; Take 1 tablet (0.1 mg total) by mouth 3 (three) times daily as needed (anxiety).  Dispense: 90 tablet; Refill: 2  2. Mild episode of recurrent major depressive disorder (HCC)  Continue- busPIRone (BUSPAR) 10 MG tablet; Take 1 tablet (10 mg total) by mouth 3 (three) times daily.  Dispense: 90 tablet; Refill: 2 Continue- traZODone (DESYREL) 100 MG tablet; Take 1 tablet (100 mg total) by mouth at bedtime.  Dispense: 30 tablet; Refill: 2 Continue- vortioxetine HBr (TRINTELLIX) 20 MG TABS tablet; Take 1 tablet (20 mg total) by mouth daily.  Dispense: 30 tablet; Refill: 2  3. PTSD (post-traumatic stress disorder)  Continue- prazosin (MINIPRESS) 1 MG capsule; Take 1 capsule (1 mg total) by mouth at bedtime.  Dispense: 30 capsule; Refill: 2  Follow-up in 3  Salley Slaughter, NP 09/05/2020, 3:12 PM

## 2020-10-09 ENCOUNTER — Ambulatory Visit (HOSPITAL_COMMUNITY)
Admission: EM | Admit: 2020-10-09 | Discharge: 2020-10-09 | Disposition: A | Discharge status: Home / Self Care | Payer: HRSA Program | Attending: Emergency Medicine | Admitting: Emergency Medicine

## 2020-10-09 ENCOUNTER — Encounter (HOSPITAL_COMMUNITY): Payer: Self-pay

## 2020-10-09 ENCOUNTER — Other Ambulatory Visit: Payer: Self-pay

## 2020-10-09 DIAGNOSIS — Z20822 Contact with and (suspected) exposure to covid-19: Secondary | ICD-10-CM | POA: Insufficient documentation

## 2020-10-09 DIAGNOSIS — J22 Unspecified acute lower respiratory infection: Secondary | ICD-10-CM | POA: Insufficient documentation

## 2020-10-09 MED ORDER — PREDNISONE 20 MG PO TABS: 40.0000 mg | ORAL_TABLET | Freq: Every day | ORAL | 0 refills | Status: AC

## 2020-10-09 MED ORDER — AMOXICILLIN-POT CLAVULANATE 875-125 MG PO TABS
1.0000 | ORAL_TABLET | Freq: Two times a day (BID) | ORAL | 0 refills | Status: DC
Start: 2020-10-09 — End: 2021-10-31

## 2020-10-09 MED ORDER — BENZONATATE 100 MG PO CAPS: 100.0000 mg | ORAL_CAPSULE | Freq: Three times a day (TID) | ORAL | 0 refills | Status: DC | PRN

## 2020-10-09 NOTE — ED Triage Notes (Signed)
Pt in with c/o productive cough that has been going on for over 1 week now. Also c/o sinus pain on the left side of her face

## 2020-10-09 NOTE — Discharge Instructions (Signed)
I am suspicious that this is related to covid19.  Push fluids to ensure adequate hydration and keep secretions thin.  Tylenol and/or ibuprofen as needed for pain or fevers.  Over the counter medications such as mucinex d can be helpful.  Tessalon as needed for cough.  Inhaler or nebulizer as needed for wheezing or shortness of breath .  Complete course of antibiotics.  Continue to decrease to quit smoking.  If symptoms worsen or do not improve in the next week to return to be seen or to follow up with your PCP.

## 2020-10-09 NOTE — ED Provider Notes (Signed)
[name]    CSN: [id number] Arrival date & time: 10/09/20  1932      History   Chief Complaint Chief Complaint  Patient presents with  . Cough    HPI Rebecca Velasquez is a 35 y.o. female.   Rebecca Velasquez presents with complaints of cough, shortness of breath  Which started 6 days ago. 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 She was tested at another site but the results still have not returned. History of asthma and she does smoke. She uses an inhaler and a nebulizer as needed. No fevers. Pain to chest wall and torso from coughing so much. Left facial sinus pressure now. Some headache. Has been vaccinated for covid-19.    ROS per HPI, negative if not otherwise mentioned.      Past Medical History:  Diagnosis Date  . Asthma   . Celiac disease   . Depression   . Endometriosis     Patient Active Problem List   Diagnosis Date Noted  . Generalized anxiety disorder 07/08/2020  . Mild episode of recurrent major depressive disorder ([place]) 07/08/2020    Past Surgical History:  Procedure Laterality Date  . GASTRIC BYPASS OPEN  2016  . IVC FILTER INSERTION  2016    OB History   No obstetric history on file.      Home Medications    Prior to Admission medications   Medication Sig Start Date End Date Taking? Authorizing Provider  amoxicillin-clavulanate (AUGMENTIN) 875-125 MG tablet Take 1 tablet by mouth every 12 (twelve) hours. 10/09/20  Yes Rebecca Velasquez, Rebecca Bal Velasquez, Rebecca Velasquez  benzonatate (TESSALON) 100 MG capsule Take 1-2 capsules (100-200 mg total) by mouth 3 (three) times daily as needed for cough. 10/09/20  Yes Rebecca Velasquez, Rebecca Bal Velasquez, Rebecca Velasquez  predniSONE (DELTASONE) 20 MG tablet Take 2 tablets (40 mg total) by mouth daily with breakfast for 5 days. 10/09/20 10/14/20 Yes Kymani Shimabukuro, Rebecca Bal Velasquez, Rebecca Velasquez  albuterol (PROVENTIL) (2.5 MG/3ML) 0.083% nebulizer solution Take 3 mLs (2.5 mg total) by nebulization every 6 (six) hours as needed for wheezing or shortness of breath. 04/23/20   Vanessa Kick, MD  albuterol (VENTOLIN HFA) 108 (90 Base) MCG/ACT inhaler Inhale 1-2 puffs into the lungs every 6 (six) hours as needed for wheezing or shortness of breath. 04/20/20   Zigmund Gottron, Rebecca Velasquez  busPIRone (BUSPAR) 10 MG tablet Take 1 tablet (10 mg total) by mouth 3 (three) times daily. 09/05/20   Salley Slaughter, Rebecca Velasquez  cefdinir (OMNICEF) 300 MG capsule Take 1 capsule (300 mg total) by mouth 2 (two) times daily. 04/23/20   Vanessa Kick, MD  cloNIDine (CATAPRES) 0.1 MG tablet Take 1 tablet (0.1 mg total) by mouth 3 (three) times daily as needed (anxiety). 09/05/20   Salley Slaughter, Rebecca Velasquez  fluticasone (FLONASE) 50 MCG/ACT nasal spray Place 1 spray into both nostrils daily. 02/08/20   Bast, Tressia Miners A, Rebecca Velasquez  HYDROcodone-homatropine (HYCODAN) 5-1.5 MG/5ML syrup Take 5 mLs by mouth every 6 (six) hours as needed for cough. 04/23/20   Vanessa Kick, MD  hydrOXYzine (ATARAX/VISTARIL) 25 MG tablet Take 1 tablet (25 mg total) by mouth 3 (three) times daily as needed for anxiety. 09/05/20   Salley Slaughter, Rebecca Velasquez  Omeprazole 20 MG TBEC Take 20 mg by mouth daily as needed (acid reflux).     [provider]  prazosin (MINIPRESS) 1 MG capsule Take 1 capsule (1 mg total) by mouth at bedtime. 09/05/20   Salley Slaughter, Rebecca Velasquez  tiZANidine (ZANAFLEX) 4 MG  tablet Take 1 tablet (4 mg total) by mouth every 8 (eight) hours as needed. 07/15/20   Jaynee Eagles, PA-C  traZODone (DESYREL) 100 MG tablet Take 1 tablet (100 mg total) by mouth at bedtime. 09/05/20   Salley Slaughter, Rebecca Velasquez  vortioxetine HBr (TRINTELLIX) 20 MG TABS tablet Take 1 tablet (20 mg total) by mouth daily. 09/05/20   Salley Slaughter, Rebecca Velasquez  DULoxetine (CYMBALTA) 60 MG capsule Take 60 mg by mouth daily.  05/05/19  [provider]  QUEtiapine (SEROQUEL) 200 MG tablet Take 200 mg by mouth at bedtime.  05/05/19  [provider]    Family History Family History  Problem Relation Age of Onset  . Rheum arthritis Mother   . Cancer Mother   .  Diabetes Father   . Cancer Father     Social History Social History   Tobacco Use  . Smoking status: Current Every Day Smoker    Packs/day: 0.50  . Smokeless tobacco: Never Used  Vaping Use  . Vaping Use: Never used  Substance Use Topics  . Alcohol use: Never  . Drug use: Never     Allergies   Patient has no known allergies.   Review of Systems Review of Systems   Physical Exam Triage Vital Signs ED Triage Vitals  Enc Vitals Group     BP 10/09/20 1957 (!) 144/91     Pulse Rate 10/09/20 1957 73     Resp 10/09/20 1957 20     Temp 10/09/20 1957 97.9 F (36.6 C)     Temp src --      SpO2 10/09/20 1957 98 %     Weight --      Height --      Head Circumference --      Peak Flow --      Pain Score 10/09/20 1955 7     Pain Loc --      Pain Edu? --      Excl. in [place]? --    No data found.  Updated Vital Signs BP (!) 144/91   Pulse 73   Temp 97.9 F (36.6 C)   Resp 20   LMP 09/26/2020 (Approximate)   SpO2 98%   Visual Acuity Right Eye Distance:   Left Eye Distance:   Bilateral Distance:    Right Eye Near:   Left Eye Near:    Bilateral Near:     Physical Exam Constitutional:      General: She is not in acute distress.    Appearance: She is well-developed.  Cardiovascular:     Rate and Rhythm: Normal rate.  Pulmonary:     Effort: Pulmonary effort is normal.     Breath sounds: Wheezing and rhonchi present.     Comments: Strong course cough Skin:    General: Skin is warm and dry.  Neurological:     Mental Status: She is alert and oriented to person, place, and time.      UC Treatments / Results  Labs (all labs ordered are listed, but only abnormal results are displayed) Labs Reviewed  SARS CORONAVIRUS 2 (TAT 6-24 HRS)    EKG   Radiology No results found.  Procedures Procedures (including critical care time)  Medications Ordered in UC Medications - No data to display  Initial Impression / Assessment and Plan / UC Course  I have  reviewed the triage vital signs and the nursing notes.  Pertinent labs & imaging results that were available during my  care of the patient were reviewed by me and considered in my medical decision making (see chart for details).     Concern for covid-19, but with asthma and as a smoker, and with abnormal lung findings, antibiotics and prednisone provided. covid testing repeated for her here today. Return precautions provided. Patient verbalized understanding and agreeable to plan.   Final Clinical Impressions(s) / UC Diagnoses   Final diagnoses:  Lower respiratory tract infection  Exposure to COVID-19 virus     Discharge Instructions     I am suspicious that this is related to covid19.  Push fluids to ensure adequate hydration and keep secretions thin.  Tylenol and/or ibuprofen as needed for pain or fevers.  Over the counter medications such as mucinex d can be helpful.  Tessalon as needed for cough.  Inhaler or nebulizer as needed for wheezing or shortness of breath .  Complete course of antibiotics.  Continue to decrease to quit smoking.  If symptoms worsen or do not improve in the next week to return to be seen or to follow up with your PCP.     ED Prescriptions    Medication Sig Dispense Auth. Provider   amoxicillin-clavulanate (AUGMENTIN) 875-125 MG tablet Take 1 tablet by mouth every 12 (twelve) hours. 14 tablet Rebecca Gamble Velasquez, Rebecca Velasquez   predniSONE (DELTASONE) 20 MG tablet Take 2 tablets (40 mg total) by mouth daily with breakfast for 5 days. 10 tablet Rebecca Gamble Velasquez, Rebecca Velasquez   benzonatate (TESSALON) 100 MG capsule Take 1-2 capsules (100-200 mg total) by mouth 3 (three) times daily as needed for cough. 21 capsule Zigmund Gottron, Rebecca Velasquez     PDMP not reviewed this encounter.   Zigmund Gottron, Rebecca Velasquez 10/09/20 2023

## 2020-10-10 LAB — SARS CORONAVIRUS 2 (TAT 6-24 HRS): SARS Coronavirus 2: NEGATIVE

## 2020-12-04 ENCOUNTER — Other Ambulatory Visit: Payer: Self-pay

## 2020-12-04 ENCOUNTER — Other Ambulatory Visit: Payer: Self-pay | Admitting: Psychiatry

## 2020-12-04 ENCOUNTER — Telehealth (INDEPENDENT_AMBULATORY_CARE_PROVIDER_SITE_OTHER): Payer: No Payment, Other | Admitting: Psychiatry

## 2020-12-04 ENCOUNTER — Encounter (HOSPITAL_COMMUNITY): Payer: Self-pay | Admitting: Psychiatry

## 2020-12-04 DIAGNOSIS — F4310 Post-traumatic stress disorder, unspecified: Secondary | ICD-10-CM

## 2020-12-04 DIAGNOSIS — F330 Major depressive disorder, recurrent, mild: Secondary | ICD-10-CM

## 2020-12-04 DIAGNOSIS — F411 Generalized anxiety disorder: Secondary | ICD-10-CM | POA: Diagnosis not present

## 2020-12-04 MED ORDER — TRAZODONE HCL 100 MG PO TABS: 100.0000 mg | ORAL_TABLET | Freq: Every day | ORAL | 2 refills | Status: DC

## 2020-12-04 MED ORDER — CLONIDINE HCL 0.1 MG PO TABS: 0.1000 mg | ORAL_TABLET | ORAL | 2 refills | Status: DC | PRN

## 2020-12-04 MED ORDER — BUSPIRONE HCL 10 MG PO TABS: 10.0000 mg | ORAL_TABLET | Freq: Three times a day (TID) | ORAL | 2 refills | Status: DC

## 2020-12-04 MED ORDER — PRAZOSIN HCL 1 MG PO CAPS: 1.0000 mg | ORAL_CAPSULE | Freq: Every day | ORAL | 2 refills | Status: DC

## 2020-12-04 MED ORDER — HYDROXYZINE HCL 25 MG PO TABS: 25.0000 mg | ORAL_TABLET | Freq: Three times a day (TID) | ORAL | 2 refills | Status: DC | PRN

## 2020-12-04 MED ORDER — VORTIOXETINE HBR 20 MG PO TABS: 20.0000 mg | ORAL_TABLET | Freq: Every day | ORAL | 2 refills | Status: DC

## 2020-12-04 NOTE — Progress Notes (Signed)
BH MD/PA/NP OP Progress Note Virtual Visit via Video Note  I connected with Rebecca Velasquez on 12/04/20 at  3:30 PM EST by a video enabled telemedicine application and verified that I am speaking with the correct person using two identifiers.  Location: Patient: Home Provider: Clinic   I discussed the limitations of evaluation and management by telemedicine and the availability of in person appointments. The patient expressed understanding and agreed to proceed.  I provided 30 minutes of non-face-to-face time during this encounter.    12/04/2020 4:03 PM Jordi Kamm  MRN:  [id number]  Chief Complaint: "I am having a hard time filling the Trintellex"  HPI: 35 year old female seen today for follow up psychiatric evaluation.  She has a psychiatric history of depression, PTSD, cannabis use disorder, and nicotine dependence.  She is currently being managed on hydroxyzine 25 mg 3 times daily, trazodone 100 mg nightly, prazosin 1 mg nightly, Buspar 10 mg three times daily, Trintellix 20 mg daily, and clonidine 0.1 mg 3 times daily as needed. She notes that her medications are  effective in managing her psychiatric conditions.    Today she is well-groomed, pleasant, cooperative, engaged in conversation, and maintains eye contact.  She informed well since her last visit.  She notes that at times she feels anxious about her finances and her sister.  She notes that she currently lives with her sister and reports that her sister suffers from anxiety which she notes affects her.  Because of her mental health she notes that her sister was unable to return to work and she is nervous about their finances.  Today provider conducted a GAD-7 and patient scored a 10, at her last visit she scored a 7.  Provider also conducted a PHQ-9 and patient scored a 12, at her last visit she scored a 8.  Today she denies SI/HI/VAH or paranoia.  She endorses adequate sleep (6-8 hours) and appetite.   Patient notes that at  times she has problems feeling Trintellix because it is expensive.  Provider recommended [facility] and apply for the patient care assistant program.  She was agreeable to this recommendation.  She also notes thaat she does not take clonidine 3 times a day.  She notes that she takes it once daily at night.  Today she is agreeable to reducing clonidine 0.1 mg 3 times daily to 0.1 mg daily.  She will continue all other medications as prescribed.  She will follow up with outpatient counseling for therapy.  No other concerns at this time.    Visit Diagnosis:    ICD-10-CM   1. Mild episode of recurrent major depressive disorder (HCC)  F33.0 vortioxetine HBr (TRINTELLIX) 20 MG TABS tablet    traZODone (DESYREL) 100 MG tablet    busPIRone (BUSPAR) 10 MG tablet  2. Generalized anxiety disorder  F41.1 traZODone (DESYREL) 100 MG tablet    hydrOXYzine (ATARAX/VISTARIL) 25 MG tablet    busPIRone (BUSPAR) 10 MG tablet    cloNIDine (CATAPRES) 0.1 MG tablet  3. PTSD (post-traumatic stress disorder)  F43.10 prazosin (MINIPRESS) 1 MG capsule    Past Psychiatric History: depression, PTSD, cannabis use disorder, and nicotine dependence  Past Medical History:  Past Medical History:  Diagnosis Date  . Asthma   . Celiac disease   . Depression   . Endometriosis     Past Surgical History:  Procedure Laterality Date  . GASTRIC BYPASS OPEN  2016  . IVC FILTER INSERTION  2016    Family Psychiatric History: Notes brother OD on heroin. He sister has PTSD, Depression, and anxiety  Family History:  Family History  Problem Relation Age of Onset  . Rheum arthritis Mother   . Cancer Mother   . Diabetes Father   . Cancer Father     Social History:  Social History   Socioeconomic History  . Marital status: Single    Spouse name: Not on file  . Number of children: Not on file  . Years of education: Not on file  . Highest education level: Not on  file  Occupational History  . Not on file  Tobacco Use  . Smoking status: Current Every Day Smoker    Packs/day: 0.50  . Smokeless tobacco: Never Used  Vaping Use  . Vaping Use: Never used  Substance and Sexual Activity  . Alcohol use: Never  . Drug use: Never  . Sexual activity: Not on file  Other Topics Concern  . Not on file  Social History Narrative  . Not on file   Social Determinants of Health   Financial Resource Strain: Not on file  Food Insecurity: Not on file  Transportation Needs: Not on file  Physical Activity: Not on file  Stress: Not on file  Social Connections: Not on file    Allergies: No Known Allergies  Metabolic Disorder Labs: No results found for: HGBA1C, MPG No results found for: PROLACTIN No results found for: CHOL, TRIG, HDL, CHOLHDL, VLDL, LDLCALC No results found for: TSH  Therapeutic Level Labs: No results found for: LITHIUM No results found for: VALPROATE No components found for:  CBMZ  Current Medications: Current Outpatient Medications  Medication Sig Dispense Refill  . albuterol (PROVENTIL) (2.5 MG/3ML) 0.083% nebulizer solution Take 3 mLs (2.5 mg total) by nebulization every 6 (six) hours as needed for wheezing or shortness of breath. 75 mL 12  . albuterol (VENTOLIN HFA) 108 (90 Base) MCG/ACT inhaler Inhale 1-2 puffs into the lungs every 6 (six) hours as needed for wheezing or shortness of breath. 18 g 0  . amoxicillin-clavulanate (AUGMENTIN) 875-125 MG tablet Take 1 tablet by mouth every 12 (twelve) hours. 14 tablet 0  . benzonatate (TESSALON) 100 MG capsule Take 1-2 capsules (100-200 mg total) by mouth 3 (three) times daily as needed for cough. 21 capsule 0  . busPIRone (BUSPAR) 10 MG tablet Take 1 tablet (10 mg total) by mouth 3 (three) times daily. 90 tablet 2  . cefdinir (OMNICEF) 300 MG capsule Take 1 capsule (300 mg total) by mouth 2 (two) times daily. 20 capsule 0  . cloNIDine (CATAPRES) 0.1 MG tablet Take 1 tablet (0.1 mg  total) by mouth as needed (anxiety). 30 tablet 2  . fluticasone (FLONASE) 50 MCG/ACT nasal spray Place 1 spray into both nostrils daily. 16 g 2  . HYDROcodone-homatropine (HYCODAN) 5-1.5 MG/5ML syrup Take 5 mLs by mouth every 6 (six) hours as needed for cough. 90 mL 0  . hydrOXYzine (ATARAX/VISTARIL) 25 MG tablet Take 1 tablet (25 mg total) by mouth 3 (three) times daily as needed for anxiety. 90 tablet 2  . Omeprazole 20 MG TBEC Take 20 mg by mouth daily as needed (acid reflux).     . prazosin (MINIPRESS) 1 MG capsule Take 1 capsule (1 mg total) by mouth at bedtime. 30 capsule 2  . tiZANidine (ZANAFLEX) 4 MG tablet Take 1 tablet (4 mg total) by mouth every 8 (eight) hours as needed. 30 tablet 0  . traZODone (DESYREL)  100 MG tablet Take 1 tablet (100 mg total) by mouth at bedtime. 30 tablet 2  . vortioxetine HBr (TRINTELLIX) 20 MG TABS tablet Take 1 tablet (20 mg total) by mouth daily. 30 tablet 2   No current facility-administered medications for this visit.     Musculoskeletal: Strength & Muscle Tone: Unable to assess due to telehealth visit [place]: Unable to assess due to telehealth visit Patient leans: N/A  Psychiatric Specialty Exam: Review of Systems  There were no vitals taken for this visit.There is no height or weight on file to calculate BMI.  General Appearance: Well Groomed  Eye Contact:  Good  Speech:  Clear and Coherent and Normal Rate  Volume:  Normal  Mood:  Euthymic and Notes that she has occasional anxiety and depression due to life stressors but reports she is able to cope with it  Affect:  Appropriate and Congruent  Thought Process:  Coherent, Goal Directed and Linear  Orientation:  Full (Time, Place, and Person)  Thought Content: WDL and Logical   Suicidal Thoughts:  No  Homicidal Thoughts:  No  Memory:  Immediate;   Good Recent;   Good Remote;   Good  Judgement:  Good  Insight:  Good  Psychomotor Activity:  Normal  Concentration:  Concentration:  Good and Attention Span: Good  Recall:  Good  Fund of Knowledge: Good  Language: Good  Akathisia:  No  Handed:  Right  AIMS (if indicated):Not done  Assets:  Communication Skills Desire for Improvement Financial Resources/Insurance Housing Intimacy Social Support  ADL's:  Intact  Cognition: WNL  Sleep:  Good   Screenings: GAD-7   Flowsheet Row [facility] Video Visit from 09/05/2020 in [facility]  Total GAD-7 Score 10 7    PHQ2-9   Flowsheet Row Video Visit from 12/04/2020 in [facility] Video Visit from 09/05/2020 in [facility]  PHQ-2 Total Score 2 2  PHQ-9 Total Score 12 8    Flowsheet Row Video Visit from 12/04/2020 in [facility]       Assessment and Plan: Patient notes that she is doing well on her current medication regimen. She informed provider that at times she has problems filling Trintellix because it is expensive.  Provider recommended that patient [facility] and apply for the patient care assistant program.  She was agreeable to this recommendation.  She also notes thaat she does not take clonidine 3 times a day.  She notes that she takes it once daily at night.  Today she is agreeable to reducing clonidine 0.1 mg 3 times daily to 0.1 mg daily.  She will continue all other medications as prescribed.  1. Generalized anxiety disorder  Continue- busPIRone (BUSPAR) 10 MG tablet; Take 1 tablet (10 mg total) by mouth 3 (three) times daily.  Dispense: 90 tablet; Refill: 2 Continue- hydrOXYzine (ATARAX/VISTARIL) 25 MG tablet; Take 1 tablet (25 mg total) by mouth 3 (three) times daily as needed for anxiety.  Dispense: 90 tablet; Refill: 2 Continue- traZODone (DESYREL) 100 MG tablet; Take 1 tablet (100 mg total) by mouth at bedtime.   Dispense: 30 tablet; Refill: 2 Reduced-- cloNIDine (CATAPRES) 0.1 MG tablet; Take 1 tablet (0.1 mg total) by mouth as needed (anxiety).  Dispense: 30 tablet; Refill: 2 2. Mild episode of recurrent major depressive disorder (  [place])  Continue- busPIRone (BUSPAR) 10 MG tablet; Take 1 tablet (10 mg total) by mouth 3 (three) times daily.  Dispense: 90 tablet; Refill: 2 Continue- traZODone (DESYREL) 100 MG tablet; Take 1 tablet (100 mg total) by mouth at bedtime.  Dispense: 30 tablet; Refill: 2 Continue- vortioxetine HBr (TRINTELLIX) 20 MG TABS tablet; Take 1 tablet (20 mg total) by mouth daily.  Dispense: 30 tablet; Refill: 2  3. PTSD (post-traumatic stress disorder)  Continue- prazosin (MINIPRESS) 1 MG capsule; Take 1 capsule (1 mg total) by mouth at bedtime.  Dispense: 30 capsule; Refill: 2     Follow-up in [place], NP 12/04/2020, 4:03 PM

## 2020-12-05 MED FILL — cloNIDine HCL 0.1 MG TABS: 0.1 | 30 days supply | Qty: 30 | Fill #0

## 2020-12-05 MED FILL — busPIRone HCL 10 MG TABS: 10 | 30 days supply | Qty: 90 | Fill #0

## 2020-12-05 MED FILL — hydrOXYzine HCL 25 MG TABS: 25 | 30 days supply | Qty: 90 | Fill #0

## 2020-12-05 MED FILL — TRAZODONE HCL 100 MG TABS: 100 | 30 days supply | Qty: 30 | Fill #0

## 2020-12-05 MED FILL — PRAZOSIN 1 MG CAPSULE: 1 | 30 days supply | Qty: 30 | Fill #0

## 2020-12-05 MED FILL — TRINTELLIX 20 MG TABLET: 20 | 30 days supply | Qty: 30 | Fill #0

## 2020-12-20 MED FILL — cloNIDine HCL 0.1 MG TABS: 0.1 | 30 days supply | Qty: 30 | Fill #0

## 2020-12-20 MED FILL — TRINTELLIX 20 MG TABLET: 20 | 30 days supply | Qty: 30 | Fill #0

## 2020-12-20 MED FILL — hydrOXYzine HCL 25 MG TABS: 25 | 30 days supply | Qty: 90 | Fill #0

## 2020-12-20 MED FILL — TRAZODONE HCL 100 MG TABS: 100 | 30 days supply | Qty: 30 | Fill #0

## 2020-12-20 MED FILL — busPIRone HCL 10 MG TABS: 10 | 30 days supply | Qty: 90 | Fill #0

## 2020-12-20 MED FILL — PRAZOSIN 1 MG CAPSULE: 1 | 30 days supply | Qty: 30 | Fill #0

## 2021-02-14 ENCOUNTER — Other Ambulatory Visit: Payer: Self-pay

## 2021-02-18 ENCOUNTER — Other Ambulatory Visit: Payer: Self-pay

## 2021-02-19 ENCOUNTER — Other Ambulatory Visit: Payer: Self-pay

## 2021-02-27 ENCOUNTER — Other Ambulatory Visit: Payer: Self-pay

## 2021-02-27 MED FILL — Vortioxetine HBr Tab 20 MG (Base Equiv): ORAL | 30 days supply | Qty: 30 | Fill #0 | Status: AC

## 2021-02-27 MED FILL — Trazodone HCl Tab 100 MG: ORAL | 30 days supply | Qty: 30 | Fill #0 | Status: AC

## 2021-03-14 ENCOUNTER — Telehealth (HOSPITAL_COMMUNITY): Payer: Self-pay | Admitting: *Deleted

## 2021-03-14 NOTE — Telephone Encounter (Signed)
Patient calling not feeling benefits from her Trintellx, describes being a severe depressed state for the past two week. Aksing if Dr can add to or increase dose.

## 2021-03-18 ENCOUNTER — Telehealth (HOSPITAL_COMMUNITY): Payer: Self-pay | Admitting: *Deleted

## 2021-03-18 NOTE — Telephone Encounter (Signed)
Called hoping to speak with Dr Ronne Binning who has gone for the day. Reports she is having low energy, no pleasure, difficulty falling asleep and reduced appetite and has been feeling like this for about a month and it started with loss of her cat. Will ask Dr Ronne Binning to call her tomorrow as patient is off work tomorrow only.

## 2021-03-19 ENCOUNTER — Other Ambulatory Visit: Payer: Self-pay

## 2021-03-19 ENCOUNTER — Other Ambulatory Visit (HOSPITAL_COMMUNITY): Payer: Self-pay | Admitting: Psychiatry

## 2021-03-19 MED ORDER — DULOXETINE HCL 30 MG PO CPEP
30.0000 mg | ORAL_CAPSULE | Freq: Every day | ORAL | 2 refills | Status: DC
Start: 2021-03-19 — End: 2021-06-25
  Filled 2021-03-19: qty 30, 30d supply, fill #0
  Filled 2021-04-18: qty 30, 30d supply, fill #1
  Filled 2021-05-19 – 2021-05-27 (×2): qty 30, 30d supply, fill #2

## 2021-03-19 NOTE — Telephone Encounter (Signed)
Patient notes that she is more restless (noting that she cannot sit still for a period of time and often picks her skin because she is overly anxious).  She also notes that she feels down and has a lack of energy.  She endorses symptoms of anhedonia.  Patient notes that duloxetine was effective in the past and is agreeable to starting duloxetine 30 mg daily to help manage symptoms of anxiety and depression.  Provider informed patient that this medication can be adjusted in the future if needed.  Provider also discussed starting another SSRI such as Prozac if duloxetine is not effective.  She endorsed understanding.  No other concerns noted at this time.

## 2021-03-21 ENCOUNTER — Other Ambulatory Visit: Payer: Self-pay

## 2021-04-18 ENCOUNTER — Other Ambulatory Visit: Payer: Self-pay

## 2021-04-18 MED FILL — Trazodone HCl Tab 100 MG: ORAL | 30 days supply | Qty: 30 | Fill #1 | Status: AC

## 2021-05-19 ENCOUNTER — Other Ambulatory Visit (HOSPITAL_COMMUNITY): Payer: Self-pay | Admitting: Psychiatry

## 2021-05-20 ENCOUNTER — Other Ambulatory Visit: Payer: Self-pay

## 2021-05-20 MED ORDER — TRAZODONE HCL 100 MG PO TABS
ORAL_TABLET | Freq: Every day | ORAL | 2 refills | Status: DC
  Filled 2021-05-20 – 2021-05-27 (×2): qty 30, 30d supply, fill #0
  Filled 2021-06-25: qty 30, 30d supply, fill #1
  Filled 2021-09-02: qty 30, 30d supply, fill #2

## 2021-05-21 ENCOUNTER — Other Ambulatory Visit: Payer: Self-pay

## 2021-05-27 ENCOUNTER — Other Ambulatory Visit: Payer: Self-pay

## 2021-06-25 ENCOUNTER — Other Ambulatory Visit (HOSPITAL_COMMUNITY): Payer: Self-pay | Admitting: Psychiatry

## 2021-06-26 ENCOUNTER — Other Ambulatory Visit: Payer: Self-pay

## 2021-06-26 MED ORDER — DULOXETINE HCL 30 MG PO CPEP
30.0000 mg | ORAL_CAPSULE | Freq: Every day | ORAL | 2 refills | Status: DC
  Filled 2021-06-26: qty 30, 30d supply, fill #0
  Filled 2021-07-29: qty 30, 30d supply, fill #1
  Filled 2021-09-02: qty 30, 30d supply, fill #2

## 2021-07-02 ENCOUNTER — Other Ambulatory Visit: Payer: Self-pay

## 2021-07-30 ENCOUNTER — Other Ambulatory Visit: Payer: Self-pay

## 2021-08-07 ENCOUNTER — Encounter (HOSPITAL_COMMUNITY): Payer: Self-pay | Admitting: Emergency Medicine

## 2021-08-07 ENCOUNTER — Ambulatory Visit (HOSPITAL_COMMUNITY)
Admission: EM | Admit: 2021-08-07 | Discharge: 2021-08-07 | Disposition: A | Discharge status: Home / Self Care | Payer: Self-pay | Attending: Family Medicine | Admitting: Family Medicine

## 2021-08-07 ENCOUNTER — Other Ambulatory Visit: Payer: Self-pay

## 2021-08-07 DIAGNOSIS — J4521 Mild intermittent asthma with (acute) exacerbation: Secondary | ICD-10-CM

## 2021-08-07 DIAGNOSIS — J069 Acute upper respiratory infection, unspecified: Secondary | ICD-10-CM

## 2021-08-07 MED ORDER — DEXAMETHASONE SODIUM PHOSPHATE 10 MG/ML IJ SOLN
INTRAMUSCULAR | Status: AC
  Filled 2021-08-07: qty 1

## 2021-08-07 MED ORDER — DEXAMETHASONE SODIUM PHOSPHATE 10 MG/ML IJ SOLN
10.0000 mg | Freq: Once | INTRAMUSCULAR | Status: AC
  Administered 2021-08-07: 10 mg via INTRAMUSCULAR

## 2021-08-07 MED ORDER — ALBUTEROL SULFATE HFA 108 (90 BASE) MCG/ACT IN AERS: 1.0000 | INHALATION_SPRAY | Freq: Four times a day (QID) | RESPIRATORY_TRACT | 0 refills | Status: DC | PRN

## 2021-08-07 MED ORDER — PREDNISONE 20 MG PO TABS
40.0000 mg | ORAL_TABLET | Freq: Every day | ORAL | 0 refills | Status: DC
Start: 2021-08-07 — End: 2021-08-28

## 2021-08-07 MED ORDER — PROMETHAZINE-DM 6.25-15 MG/5ML PO SYRP: 5.0000 mL | ORAL_SOLUTION | Freq: Four times a day (QID) | ORAL | 0 refills | Status: DC | PRN

## 2021-08-07 NOTE — ED Triage Notes (Signed)
Patient started feeling sick one week ago.  Initially had a runny nose, throat sore and post nasal drip.  Patient has a cough and feeling wheezy.  Patient has been using nebulizer equipment at home.  Raspy respirations.  Patient thinks she needs a steroid

## 2021-08-07 NOTE — ED Provider Notes (Addendum)
[place]    CSN: [id number] Arrival date & time: 08/07/21  1208      History   Chief Complaint Chief Complaint  Patient presents with   Cough    HPI Rebecca Velasquez is a 35 y.o. female.   Patient presenting today with 1 week history of progressively worsening cough, wheezing, chest tightness.  Initially had some sore throat and congestion but that has all dissipated now.  Has been taking her albuterol nebulizer treatments and albuterol inhalers for asthma with mild temporary relief of symptoms.  Denies fever, chills, chest pain, significant shortness of breath, abdominal pain, nausea vomiting or diarrhea.  No new sick contacts.   Past Medical History:  Diagnosis Date   Asthma    Celiac disease    Depression    Endometriosis     Patient Active Problem List   Diagnosis Date Noted   Generalized anxiety disorder 07/08/2020   Mild episode of recurrent major depressive disorder ([place]) 07/08/2020    Past Surgical History:  Procedure Laterality Date   GASTRIC BYPASS OPEN  2016   IVC FILTER INSERTION  2016    OB History   No obstetric history on file.      Home Medications    Prior to Admission medications   Medication Sig Start Date End Date Taking? Authorizing Provider  predniSONE (DELTASONE) 20 MG tablet Take 2 tablets (40 mg total) by mouth daily with breakfast. 08/07/21  Yes Volney American, PA-C  promethazine-dextromethorphan (PROMETHAZINE-DM) 6.25-15 MG/5ML syrup Take 5 mLs by mouth 4 (four) times daily as needed for cough. 08/07/21  Yes Volney American, PA-C  albuterol (PROVENTIL) (2.5 MG/3ML) 0.083% nebulizer solution Take 3 mLs (2.5 mg total) by nebulization every 6 (six) hours as needed for wheezing or shortness of breath. 04/23/20   Vanessa Kick, MD  albuterol (VENTOLIN HFA) 108 (90 Base) MCG/ACT inhaler Inhale 1-2 puffs into the lungs every 6 (six) hours as needed for wheezing or shortness of breath. 08/07/21   Volney American, PA-C  amoxicillin-clavulanate (AUGMENTIN) 875-125 MG tablet Take 1 tablet by mouth every 12 (twelve) hours. Patient not taking: Reported on 08/07/2021 10/09/20   Zigmund Gottron, NP  benzonatate (TESSALON) 100 MG capsule Take 1-2 capsules (100-200 mg total) by mouth 3 (three) times daily as needed for cough. Patient not taking: Reported on 08/07/2021 10/09/20   Augusto Gamble B, NP  busPIRone (BUSPAR) 10 MG tablet Take 1 tablet (10 mg total) by mouth 3 (three) times daily. Patient not taking: Reported on 08/07/2021 12/04/20   Salley Slaughter, NP  busPIRone (BUSPAR) 10 MG tablet TAKE 1 TABLET (10 MG TOTAL) BY MOUTH 3 (THREE) TIMES DAILY. Patient not taking: Reported on 08/07/2021 12/04/20 12/04/21  Salley Slaughter, NP  cefdinir (OMNICEF) 300 MG capsule Take 1 capsule (300 mg total) by mouth 2 (two) times daily. Patient not taking: Reported on 08/07/2021 04/23/20   Vanessa Kick, MD  cloNIDine (CATAPRES) 0.1 MG tablet Take 1 tablet (0.1 mg total) by mouth as needed (anxiety). Patient not taking: Reported on 08/07/2021 12/04/20   Eulis Canner E, NP  cloNIDine (CATAPRES) 0.1 MG tablet TAKE 1 TABLET (0.1 MG TOTAL) BY MOUTH AS NEEDED (ANXIETY). 12/04/20 12/04/21  Salley Slaughter, NP  DULoxetine (CYMBALTA) 30 MG capsule Take 1 capsule (30 mg total) by mouth daily. 06/26/21   Salley Slaughter, NP  fluticasone (FLONASE) 50 MCG/ACT nasal spray Place 1 spray into both nostrils daily. Patient not taking: Reported on 08/07/2021  02/08/20   Bast, Tressia Miners A, NP  HYDROcodone-homatropine (HYCODAN) 5-1.5 MG/5ML syrup Take 5 mLs by mouth every 6 (six) hours as needed for cough. Patient not taking: Reported on 08/07/2021 04/23/20   Vanessa Kick, MD  hydrOXYzine (ATARAX/VISTARIL) 25 MG tablet Take 1 tablet (25 mg total) by mouth 3 (three) times daily as needed for anxiety. Patient not taking: Reported on 08/07/2021 12/04/20   Eulis Canner E, NP  hydrOXYzine (ATARAX/VISTARIL) 25 MG tablet TAKE 1 TABLET  (25 MG TOTAL) BY MOUTH 3 (THREE) TIMES DAILY AS NEEDED FOR ANXIETY. Patient not taking: Reported on 08/07/2021 12/04/20 12/04/21  Salley Slaughter, NP  Omeprazole 20 MG TBEC Take 20 mg by mouth daily as needed (acid reflux).     [provider]  prazosin (MINIPRESS) 1 MG capsule Take 1 capsule (1 mg total) by mouth at bedtime. Patient not taking: Reported on 08/07/2021 12/04/20   Eulis Canner E, NP  prazosin (MINIPRESS) 1 MG capsule TAKE 1 CAPSULE (1 MG TOTAL) BY MOUTH AT BEDTIME. Patient not taking: Reported on 08/07/2021 12/04/20 12/04/21  Salley Slaughter, NP  tiZANidine (ZANAFLEX) 4 MG tablet Take 1 tablet (4 mg total) by mouth every 8 (eight) hours as needed. Patient not taking: Reported on 08/07/2021 07/15/20   Jaynee Eagles, PA-C  traZODone (DESYREL) 100 MG tablet Take 1 tablet (100 mg total) by mouth at bedtime. 12/04/20   Salley Slaughter, NP  traZODone (DESYREL) 100 MG tablet TAKE 1 TABLET (100 MG TOTAL) BY MOUTH AT BEDTIME. 05/20/21 05/20/22  Eulis Canner E, NP  QUEtiapine (SEROQUEL) 200 MG tablet Take 200 mg by mouth at bedtime.  05/05/19  [provider]    Family History Family History  Problem Relation Age of Onset   Rheum arthritis Mother    Cancer Mother    Diabetes Father    Cancer Father     Social History Social History   Tobacco Use   Smoking status: Every Day    Packs/day: 0.50    Types: Cigarettes   Smokeless tobacco: Never  Vaping Use   Vaping Use: Never used  Substance Use Topics   Alcohol use: Never   Drug use: Never     Allergies   Patient has no known allergies.   Review of Systems Review of Systems Per HPI  Physical Exam Triage Vital Signs ED Triage Vitals  Enc Vitals Group     BP 08/07/21 1319 107/63     Pulse Rate 08/07/21 1319 60     Resp 08/07/21 1319 (!) 22     Temp 08/07/21 1319 97.9 F (36.6 C)     Temp Source 08/07/21 1319 Oral     SpO2 08/07/21 1319 98 %     Weight --      Height --      Head  Circumference --      Peak Flow --      Pain Score 08/07/21 1314 5     Pain Loc --      Pain Edu? --      Excl. in [place]? --    No data found.  Updated Vital Signs BP 107/63 (BP Location: Right Arm) Comment (BP Location): large cuff  Pulse 60   Temp 97.9 F (36.6 C) (Oral)   Resp (!) 22   LMP 07/10/2021   SpO2 98%   Visual Acuity Right Eye Distance:   Left Eye Distance:   Bilateral Distance:    Right Eye Near:   Left Eye  Near:    Bilateral Near:     Physical Exam Vitals and nursing note reviewed.  Constitutional:      Appearance: Normal appearance. She is not ill-appearing.  HENT:     Head: Atraumatic.     Right Ear: Tympanic membrane normal.     Left Ear: Tympanic membrane normal.     Nose: Nose normal.     Mouth/Throat:     Mouth: Mucous membranes are moist.     Pharynx: Oropharynx is clear. Posterior oropharyngeal erythema present.  Eyes:     Extraocular Movements: Extraocular movements intact.     Conjunctiva/sclera: Conjunctivae normal.  Cardiovascular:     Rate and Rhythm: Normal rate and regular rhythm.     Heart sounds: Normal heart sounds.  Pulmonary:     Effort: Pulmonary effort is normal. No respiratory distress.     Breath sounds: Wheezing present. No rales.     Comments: Moderate diffuse wheezes bilaterally Musculoskeletal:        General: Normal range of motion.     Cervical back: Normal range of motion and neck supple.  Skin:    General: Skin is warm and dry.  Neurological:     Mental Status: She is alert and oriented to person, place, and time.  Psychiatric:        Mood and Affect: Mood normal.        Thought Content: Thought content normal.        Judgment: Judgment normal.     UC Treatments / Results  Labs (all labs ordered are listed, but only abnormal results are displayed) Labs Reviewed - No data to display  EKG   Radiology No results found.  Procedures Procedures (including critical care time)  Medications Ordered in  UC Medications  dexamethasone (DECADRON) injection 10 mg (has no administration in time range)    Initial Impression / Assessment and Plan / UC Course  I have reviewed the triage vital signs and the nursing notes.  Pertinent labs & imaging results that were available during my care of the patient were reviewed by me and considered in my medical decision making (see chart for details).     Consistent with asthma exacerbation secondary to viral upper respiratory infection, will treat with IM Decadron, prednisone, Phenergan DM, refill albuterol inhaler.  Discussed Mucinex, good fluid intake and rest.  Return for acutely worsening symptoms.  Work note given.  Final Clinical Impressions(s) / UC Diagnoses   Final diagnoses:  Mild intermittent asthma with acute exacerbation  Viral URI with cough   Discharge Instructions   None    ED Prescriptions     Medication Sig Dispense Auth. Provider   predniSONE (DELTASONE) 20 MG tablet Take 2 tablets (40 mg total) by mouth daily with breakfast. 10 tablet Volney American, PA-C   promethazine-dextromethorphan (PROMETHAZINE-DM) 6.25-15 MG/5ML syrup Take 5 mLs by mouth 4 (four) times daily as needed for cough. 100 mL Volney American, PA-C   albuterol (VENTOLIN HFA) 108 (90 Base) MCG/ACT inhaler Inhale 1-2 puffs into the lungs every 6 (six) hours as needed for wheezing or shortness of breath. 18 g Volney American, [place]      PDMP not reviewed this encounter.   Volney American, PA-C 08/07/21 1335    Volney American, [place] 08/07/21 1336

## 2021-08-28 ENCOUNTER — Other Ambulatory Visit: Payer: Self-pay

## 2021-08-28 ENCOUNTER — Ambulatory Visit (HOSPITAL_COMMUNITY)
Admission: EM | Admit: 2021-08-28 | Discharge: 2021-08-28 | Disposition: A | Discharge status: Home / Self Care | Payer: Self-pay | Attending: Emergency Medicine | Admitting: Emergency Medicine

## 2021-08-28 ENCOUNTER — Encounter (HOSPITAL_COMMUNITY): Payer: Self-pay | Admitting: Emergency Medicine

## 2021-08-28 DIAGNOSIS — J0100 Acute maxillary sinusitis, unspecified: Secondary | ICD-10-CM

## 2021-08-28 MED ORDER — PREDNISONE 20 MG PO TABS
40.0000 mg | ORAL_TABLET | Freq: Every day | ORAL | 0 refills | Status: DC
Start: 2021-08-28 — End: 2021-10-31

## 2021-08-28 MED ORDER — ALBUTEROL SULFATE (2.5 MG/3ML) 0.083% IN NEBU
2.5000 mg | INHALATION_SOLUTION | Freq: Four times a day (QID) | RESPIRATORY_TRACT | 12 refills | Status: DC | PRN
Start: 2021-08-28 — End: 2021-12-03

## 2021-08-28 MED ORDER — FLUCONAZOLE 150 MG PO TABS
150.0000 mg | ORAL_TABLET | Freq: Every day | ORAL | 0 refills | Status: AC
Start: 2021-08-28 — End: 2021-08-30

## 2021-08-28 MED ORDER — METHYLPREDNISOLONE SODIUM SUCC 125 MG IJ SOLR
60.0000 mg | Freq: Once | INTRAMUSCULAR | Status: AC
  Administered 2021-08-28: 60 mg via INTRAMUSCULAR

## 2021-08-28 MED ORDER — AMOXICILLIN-POT CLAVULANATE 875-125 MG PO TABS
1.0000 | ORAL_TABLET | Freq: Two times a day (BID) | ORAL | 0 refills | Status: DC
Start: 2021-08-28 — End: 2021-10-31

## 2021-08-28 MED ORDER — METHYLPREDNISOLONE SODIUM SUCC 125 MG IJ SOLR
INTRAMUSCULAR | Status: AC
  Filled 2021-08-28: qty 2

## 2021-08-28 NOTE — Discharge Instructions (Addendum)
Your symptoms today are most likely related to a sinus infection  Take Augmentin twice a day for the next 7 days, you may use Diflucan as needed if yeast infections occur, take as directed  Beginning tomorrow take prednisone every morning with food for the next 5 days  You may continue use of your albuterol nebulizer every 6 hours as needed for wheezing or shortness of breath  May follow-up with urgent care as needed

## 2021-08-28 NOTE — ED Triage Notes (Signed)
Patient was seen for the same 08/07/2021.  Patient has not improved.  Patient has facial pain/headache

## 2021-08-28 NOTE — ED Provider Notes (Signed)
[place]    CSN: [id number] Arrival date & time: 08/28/21  1436      History   Chief Complaint Chief Complaint  Patient presents with   URI    HPI Rebecca Velasquez is a 35 y.o. female.   Patient presents with congestion, ear pain, sinus pain and pressure and intermittent headaches with 1 week. Cough, shortness of breath and wheezing for 1 month. Attempted use of dayquil and nyquil with minimal relief. Has been using albuterol nebulizer with 3 days times a day. History of asthma, celiac disease and endometriosis.   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      Home Medications    Prior to Admission medications   Medication Sig Start Date End Date Taking? Authorizing Provider  albuterol (PROVENTIL) (2.5 MG/3ML) 0.083% nebulizer solution Take 3 mLs (2.5 mg total) by nebulization every 6 (six) hours as needed for wheezing or shortness of breath. 04/23/20   Vanessa Kick, MD  albuterol (VENTOLIN HFA) 108 (90 Base) MCG/ACT inhaler Inhale 1-2 puffs into the lungs every 6 (six) hours as needed for wheezing or shortness of breath. 08/07/21   Volney American, PA-C  amoxicillin-clavulanate (AUGMENTIN) 875-125 MG tablet Take 1 tablet by mouth every 12 (twelve) hours. Patient not taking: Reported on 08/07/2021 10/09/20   Zigmund Gottron, NP  benzonatate (TESSALON) 100 MG capsule Take 1-2 capsules (100-200 mg total) by mouth 3 (three) times daily as needed for cough. Patient not taking: Reported on 08/07/2021 10/09/20   Augusto Gamble B, NP  busPIRone (BUSPAR) 10 MG tablet Take 1  tablet (10 mg total) by mouth 3 (three) times daily. Patient not taking: Reported on 08/07/2021 12/04/20   Salley Slaughter, NP  busPIRone (BUSPAR) 10 MG tablet TAKE 1 TABLET (10 MG TOTAL) BY MOUTH 3 (THREE) TIMES DAILY. Patient not taking: Reported on 08/07/2021 12/04/20 12/04/21  Salley Slaughter, NP  cefdinir (OMNICEF) 300 MG capsule Take 1 capsule (300 mg total) by mouth 2 (two) times daily. Patient not taking: Reported on 08/07/2021 04/23/20   Vanessa Kick, MD  cloNIDine (CATAPRES) 0.1 MG tablet Take 1 tablet (0.1 mg total) by mouth as needed (anxiety). Patient not taking: Reported on 08/07/2021 12/04/20   Eulis Canner E, NP  cloNIDine (CATAPRES) 0.1 MG tablet TAKE 1 TABLET (0.1 MG TOTAL) BY MOUTH AS NEEDED (ANXIETY). 12/04/20 12/04/21  Salley Slaughter, NP  DULoxetine (CYMBALTA) 30 MG capsule Take 1 capsule (30 mg total) by mouth daily. 06/26/21   Salley Slaughter, NP  fluticasone (FLONASE) 50 MCG/ACT nasal spray Place 1 spray into both nostrils daily. Patient not taking: Reported on 08/07/2021 02/08/20   Loura Halt A, NP  HYDROcodone-homatropine (HYCODAN) 5-1.5 MG/5ML syrup Take 5 mLs by mouth every 6 (six) hours as needed for cough. Patient not taking: Reported on 08/07/2021 04/23/20   Vanessa Kick, MD  hydrOXYzine (ATARAX/VISTARIL) 25 MG tablet Take 1 tablet (25 mg total) by mouth 3 (three) times daily as needed for anxiety. Patient not taking: Reported on 08/07/2021  12/04/20   Salley Slaughter, NP  hydrOXYzine (ATARAX/VISTARIL) 25 MG tablet TAKE 1 TABLET (25 MG TOTAL) BY MOUTH 3 (THREE) TIMES DAILY AS NEEDED FOR ANXIETY. Patient not taking: Reported on 08/07/2021 12/04/20 12/04/21  Salley Slaughter, NP  Omeprazole 20 MG TBEC Take 20 mg by mouth daily as needed (acid reflux).     [provider]  prazosin (MINIPRESS) 1 MG capsule Take 1 capsule (1 mg total) by mouth at bedtime. Patient not taking: Reported on 08/07/2021 12/04/20   Eulis Canner E, NP  prazosin (MINIPRESS)  1 MG capsule TAKE 1 CAPSULE (1 MG TOTAL) BY MOUTH AT BEDTIME. Patient not taking: Reported on 08/07/2021 12/04/20 12/04/21  Salley Slaughter, NP  predniSONE (DELTASONE) 20 MG tablet Take 2 tablets (40 mg total) by mouth daily with breakfast. Patient not taking: Reported on 08/28/2021 08/07/21   Volney American, PA-C  promethazine-dextromethorphan (PROMETHAZINE-DM) 6.25-15 MG/5ML syrup Take 5 mLs by mouth 4 (four) times daily as needed for cough. Patient not taking: Reported on 08/28/2021 08/07/21   Volney American, PA-C  tiZANidine (ZANAFLEX) 4 MG tablet Take 1 tablet (4 mg total) by mouth every 8 (eight) hours as needed. Patient not taking: Reported on 08/07/2021 07/15/20   Jaynee Eagles, PA-C  traZODone (DESYREL) 100 MG tablet Take 1 tablet (100 mg total) by mouth at bedtime. 12/04/20   Salley Slaughter, NP  traZODone (DESYREL) 100 MG tablet TAKE 1 TABLET (100 MG TOTAL) BY MOUTH AT BEDTIME. 05/20/21 05/20/22  Eulis Canner E, NP  QUEtiapine (SEROQUEL) 200 MG tablet Take 200 mg by mouth at bedtime.  05/05/19  [provider]    Family History Family History  Problem Relation Age of Onset   Rheum arthritis Mother    Cancer Mother    Diabetes Father    Cancer Father     Social History Social History   Tobacco Use   Smoking status: Every Day    Packs/day: 0.50    Types: Cigarettes   Smokeless tobacco: Never  Vaping Use   Vaping Use: Never used  Substance Use Topics   Alcohol use: Never   Drug use: Never     Allergies   Patient has no known allergies.   Review of Systems Review of Systems  Constitutional: Negative.   HENT:  Positive for congestion, ear pain, sinus pressure and sinus pain. Negative for dental problem, drooling, ear discharge, facial swelling, hearing loss, mouth sores, nosebleeds, postnasal drip, rhinorrhea, sneezing, sore throat, tinnitus, trouble swallowing and voice change.   Respiratory:  Positive for cough, shortness of breath and  wheezing. Negative for apnea, choking, chest tightness and stridor.   Cardiovascular: Negative.   Gastrointestinal: Negative.   Skin: Negative.   Neurological:  Positive for headaches. Negative for dizziness, tremors, seizures, syncope, facial asymmetry, speech difficulty, weakness, light-headedness and numbness.    Physical Exam Triage Vital Signs ED Triage Vitals  Enc Vitals Group     BP 08/28/21 1626 (!) 147/75     Pulse Rate 08/28/21 1626 60     Resp 08/28/21 1626 (!) 22     Temp 08/28/21 1626 98.7 F (37.1 C)     Temp Source 08/28/21 1626 Oral     SpO2 08/28/21 1626 97 %     Weight --      Height --      Head Circumference --      Peak Flow --      Pain Score 08/28/21 1622 10  Pain Loc --      Pain Edu? --      Excl. in [place]? --    No data found.  Updated Vital Signs BP (!) 147/75 (BP Location: Left Arm) Comment (BP Location): large cuff  Pulse 60   Temp 98.7 F (37.1 C) (Oral)   Resp (!) 22   LMP 08/04/2021   SpO2 97%   Visual Acuity Right Eye Distance:   Left Eye Distance:   Bilateral Distance:    Right Eye Near:   Left Eye Near:    Bilateral Near:     Physical Exam Constitutional:      Appearance: Normal appearance.  HENT:     Head: Normocephalic.     Right Ear: Hearing, ear canal and external ear normal. A middle ear effusion is present.     Left Ear: Hearing, ear canal and external ear normal. A middle ear effusion is present.     Mouth/Throat:     Mouth: Mucous membranes are moist.  Eyes:     Extraocular Movements: Extraocular movements intact.  Cardiovascular:     Rate and Rhythm: Normal rate and regular rhythm.     Pulses: Normal pulses.     Heart sounds: Normal heart sounds.  Pulmonary:     Effort: Pulmonary effort is normal.     Breath sounds: Normal breath sounds.  Skin:    General: Skin is warm and dry.  Neurological:     Mental Status: She is alert and oriented to person, place, and time. Mental status is at baseline.   Psychiatric:        Mood and Affect: Mood normal.        Behavior: Behavior normal.     UC Treatments / Results  Labs (all labs ordered are listed, but only abnormal results are displayed) Labs Reviewed - No data to display  EKG   Radiology No results found.  Procedures Procedures (including critical care time)  Medications Ordered in UC Medications - No data to display  Initial Impression / Assessment and Plan / UC Course  I have reviewed the triage vital signs and the nursing notes.  Pertinent labs & imaging results that were available during my care of the patient were reviewed by me and considered in my medical decision making (see chart for details).  aCute nonrecurrent maxillary sinusitis  1.  Augmentin 875-125 twice daily for 7 days Diflucan 150 mg once ordered as needed, patient endorses that she gets frequent yeast infections after use of antibiotics 2.  Albuterol nebulizer refilled, patient has been using medication 3 times a day and could possibly benefit from a maintenance inhaler however due to findings that she is unable to afford at this time 4.  Prednisone 40 mg daily for 5 days beginning tomorrow 5.  Methylprednisolone 60 mg IM now 6. UC follow up as needed 7. Work not given  Final Clinical Impressions(s) / UC Diagnoses   Final diagnoses:  None   Discharge Instructions   None    ED Prescriptions   None    PDMP not reviewed this encounter.   Hans Eden, NP 08/28/21 1723

## 2021-09-03 ENCOUNTER — Other Ambulatory Visit: Payer: Self-pay

## 2021-09-04 ENCOUNTER — Other Ambulatory Visit: Payer: Self-pay

## 2021-10-03 ENCOUNTER — Other Ambulatory Visit (HOSPITAL_COMMUNITY): Payer: Self-pay

## 2021-10-05 ENCOUNTER — Other Ambulatory Visit (HOSPITAL_COMMUNITY): Payer: Self-pay | Admitting: Psychiatry

## 2021-10-06 ENCOUNTER — Other Ambulatory Visit: Payer: Self-pay

## 2021-10-08 ENCOUNTER — Other Ambulatory Visit: Payer: Self-pay

## 2021-10-08 ENCOUNTER — Other Ambulatory Visit (HOSPITAL_COMMUNITY): Payer: Self-pay | Admitting: Psychiatry

## 2021-10-08 MED ORDER — DULOXETINE HCL 30 MG PO CPEP
30.0000 mg | ORAL_CAPSULE | Freq: Every day | ORAL | 2 refills | Status: DC
  Filled 2021-10-08 – 2021-10-15 (×2): qty 30, 30d supply, fill #0
  Filled 2021-11-16: qty 30, 30d supply, fill #1
  Filled 2021-12-25: qty 30, 30d supply, fill #2

## 2021-10-08 MED ORDER — TRAZODONE HCL 100 MG PO TABS
ORAL_TABLET | Freq: Every day | ORAL | 2 refills | Status: DC
  Filled 2021-10-08 – 2021-10-15 (×2): qty 30, 30d supply, fill #0

## 2021-10-15 ENCOUNTER — Other Ambulatory Visit: Payer: Self-pay

## 2021-10-31 ENCOUNTER — Telehealth (HOSPITAL_COMMUNITY): Payer: Self-pay | Admitting: Emergency Medicine

## 2021-10-31 ENCOUNTER — Other Ambulatory Visit: Payer: Self-pay

## 2021-10-31 ENCOUNTER — Ambulatory Visit (HOSPITAL_COMMUNITY)
Admission: EM | Admit: 2021-10-31 | Discharge: 2021-10-31 | Disposition: A | Discharge status: Home / Self Care | Payer: Self-pay | Attending: Family Medicine | Admitting: Family Medicine

## 2021-10-31 ENCOUNTER — Encounter (HOSPITAL_COMMUNITY): Payer: Self-pay | Admitting: Emergency Medicine

## 2021-10-31 ENCOUNTER — Telehealth (HOSPITAL_COMMUNITY): Payer: Self-pay | Admitting: Family Medicine

## 2021-10-31 DIAGNOSIS — J029 Acute pharyngitis, unspecified: Secondary | ICD-10-CM

## 2021-10-31 DIAGNOSIS — R509 Fever, unspecified: Secondary | ICD-10-CM

## 2021-10-31 DIAGNOSIS — J0120 Acute ethmoidal sinusitis, unspecified: Secondary | ICD-10-CM

## 2021-10-31 LAB — POCT RAPID STREP A, ED / UC: Streptococcus, Group A Screen (Direct): NEGATIVE

## 2021-10-31 LAB — POC INFLUENZA A AND B ANTIGEN (URGENT CARE ONLY)
INFLUENZA A ANTIGEN, POC: NEGATIVE
INFLUENZA B ANTIGEN, POC: NEGATIVE

## 2021-10-31 MED ORDER — PREDNISONE 20 MG PO TABS: 40.0000 mg | ORAL_TABLET | Freq: Every day | ORAL | 0 refills | Status: DC

## 2021-10-31 MED ORDER — PREDNISONE 20 MG PO TABS: 40.0000 mg | ORAL_TABLET | Freq: Every day | ORAL | 0 refills | Status: AC

## 2021-10-31 MED ORDER — AMOXICILLIN-POT CLAVULANATE 875-125 MG PO TABS: 1.0000 | ORAL_TABLET | Freq: Two times a day (BID) | ORAL | 0 refills | Status: AC

## 2021-10-31 NOTE — ED Triage Notes (Signed)
Pt c/o sore throat and fever  x 1 day, cough, nasal congestion, sinus pain x 1 week.

## 2021-10-31 NOTE — ED Provider Notes (Signed)
[place]    CSN: [id number] Arrival date & time: 10/31/21  1209      History   Chief Complaint Chief Complaint  Patient presents with   URI    HPI Rebecca Velasquez is a 36 y.o. female.   Patient is here for uri symptoms  She has had sinus pain, pressure x 1 week.  Yesterday am woke with sore throat, nausea.  Fever last night of 100.9.  chills, body aches.  Mild cough.  Used advil/tylenol to help with the fever.  Sick contact at work.   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      Home Medications    Prior to Admission medications   Medication Sig Start Date End Date Taking? Authorizing Provider  albuterol (PROVENTIL) (2.5 MG/3ML) 0.083% nebulizer solution Take 3 mLs (2.5 mg total) by nebulization every 6 (six) hours as needed for wheezing or shortness of breath. 08/28/21   White, Leitha Schuller, NP  albuterol (VENTOLIN HFA) 108 (90 Base) MCG/ACT inhaler Inhale 1-2 puffs into the lungs every 6 (six) hours as needed for wheezing or shortness of breath. 08/07/21   Volney American, PA-C  amoxicillin-clavulanate (AUGMENTIN) 875-125 MG tablet Take 1 tablet by mouth every 12 (twelve) hours. Patient not taking: Reported on 08/07/2021 10/09/20   Augusto Gamble B, NP  amoxicillin-clavulanate (AUGMENTIN) 875-125 MG tablet Take 1 tablet by mouth every 12 (twelve) hours. 08/28/21   White, Leitha Schuller, NP  benzonatate (TESSALON) 100 MG capsule Take 1-2 capsules (100-200 mg total) by mouth 3 (three) times daily as needed for cough. Patient not taking: Reported on 08/07/2021 10/09/20    Augusto Gamble B, NP  busPIRone (BUSPAR) 10 MG tablet Take 1 tablet (10 mg total) by mouth 3 (three) times daily. Patient not taking: Reported on 08/07/2021 12/04/20   Salley Slaughter, NP  busPIRone (BUSPAR) 10 MG tablet TAKE 1 TABLET (10 MG TOTAL) BY MOUTH 3 (THREE) TIMES DAILY. Patient not taking: Reported on 08/07/2021 12/04/20 12/04/21  Salley Slaughter, NP  cefdinir (OMNICEF) 300 MG capsule Take 1 capsule (300 mg total) by mouth 2 (two) times daily. Patient not taking: Reported on 08/07/2021 04/23/20   Vanessa Kick, MD  cloNIDine (CATAPRES) 0.1 MG tablet Take 1 tablet (0.1 mg total) by mouth as needed (anxiety). Patient not taking: Reported on 08/07/2021 12/04/20   Eulis Canner E, NP  cloNIDine (CATAPRES) 0.1 MG tablet TAKE 1 TABLET (0.1 MG TOTAL) BY MOUTH AS NEEDED (ANXIETY). 12/04/20 12/04/21  Salley Slaughter, NP  DULoxetine (CYMBALTA) 30 MG capsule Take 1 capsule (30 mg total) by mouth daily. 10/08/21   Salley Slaughter, NP  fluticasone (FLONASE) 50 MCG/ACT nasal spray Place 1 spray into both nostrils daily. Patient not taking: Reported on 08/07/2021 02/08/20   Loura Halt A, NP  HYDROcodone-homatropine (HYCODAN) 5-1.5 MG/5ML syrup Take 5 mLs by mouth every 6 (six) hours as needed for cough. Patient not taking: Reported on 08/07/2021 04/23/20   Vanessa Kick, MD  hydrOXYzine (ATARAX/VISTARIL) 25 MG tablet Take  1 tablet (25 mg total) by mouth 3 (three) times daily as needed for anxiety. Patient not taking: Reported on 08/07/2021 12/04/20   Eulis Canner E, NP  hydrOXYzine (ATARAX/VISTARIL) 25 MG tablet TAKE 1 TABLET (25 MG TOTAL) BY MOUTH 3 (THREE) TIMES DAILY AS NEEDED FOR ANXIETY. Patient not taking: Reported on 08/07/2021 12/04/20 12/04/21  Salley Slaughter, NP  Omeprazole 20 MG TBEC Take 20 mg by mouth daily as needed (acid reflux).     [provider]  prazosin (MINIPRESS) 1 MG capsule Take 1 capsule (1 mg total) by mouth at bedtime. Patient not taking: Reported on  08/07/2021 12/04/20   Eulis Canner E, NP  prazosin (MINIPRESS) 1 MG capsule TAKE 1 CAPSULE (1 MG TOTAL) BY MOUTH AT BEDTIME. Patient not taking: Reported on 08/07/2021 12/04/20 12/04/21  Salley Slaughter, NP  predniSONE (DELTASONE) 20 MG tablet Take 2 tablets (40 mg total) by mouth daily. 08/28/21   Hans Eden, NP  promethazine-dextromethorphan (PROMETHAZINE-DM) 6.25-15 MG/5ML syrup Take 5 mLs by mouth 4 (four) times daily as needed for cough. Patient not taking: Reported on 08/28/2021 08/07/21   Volney American, PA-C  tiZANidine (ZANAFLEX) 4 MG tablet Take 1 tablet (4 mg total) by mouth every 8 (eight) hours as needed. Patient not taking: Reported on 08/07/2021 07/15/20   Jaynee Eagles, PA-C  traZODone (DESYREL) 100 MG tablet Take 1 tablet (100 mg total) by mouth at bedtime. 12/04/20   Salley Slaughter, NP  traZODone (DESYREL) 100 MG tablet TAKE 1 TABLET (100 MG TOTAL) BY MOUTH AT BEDTIME. 10/08/21 10/08/22  Eulis Canner E, NP  QUEtiapine (SEROQUEL) 200 MG tablet Take 200 mg by mouth at bedtime.  05/05/19  [provider]    Family History Family History  Problem Relation Age of Onset   Rheum arthritis Mother    Cancer Mother    Diabetes Father    Cancer Father     Social History Social History   Tobacco Use   Smoking status: Every Day    Packs/day: 0.50    Types: Cigarettes   Smokeless tobacco: Never  Vaping Use   Vaping Use: Never used  Substance Use Topics   Alcohol use: Never   Drug use: Never     Allergies   Patient has no known allergies.   Review of Systems Review of Systems  Constitutional:  Positive for chills, fatigue and fever.  HENT:  Positive for congestion, ear pain, rhinorrhea, sinus pressure, sinus pain and sore throat.   Eyes: Negative.   Respiratory:  Positive for cough. Negative for shortness of breath and wheezing.   Cardiovascular: Negative.   Gastrointestinal:  Positive for nausea.    Physical Exam Triage Vital  Signs ED Triage Vitals  Enc Vitals Group     BP 10/31/21 1329 130/90     Pulse Rate 10/31/21 1329 67     Resp 10/31/21 1329 16     Temp 10/31/21 1329 98.5 F (36.9 C)     Temp Source 10/31/21 1329 Oral     SpO2 10/31/21 1329 97 %     Weight --      Height --      Head Circumference --      Peak Flow --      Pain Score 10/31/21 1330 6     Pain Loc --      Pain Edu? --      Excl. in [place]? --    No data found.  Updated Vital  Signs BP 130/90 (BP Location: Right Arm)    Pulse 67    Temp 98.5 F (36.9 C) (Oral)    Resp 16    SpO2 97%   Visual Acuity Right Eye Distance:   Left Eye Distance:   Bilateral Distance:    Right Eye Near:   Left Eye Near:    Bilateral Near:     Physical Exam Constitutional:      Appearance: Normal appearance.  HENT:     Right Ear: Tympanic membrane normal.     Left Ear: Tympanic membrane normal.     Nose: Congestion and rhinorrhea present.     Right Sinus: Maxillary sinus tenderness present.     Left Sinus: Maxillary sinus tenderness present.     Mouth/Throat:     Mouth: Mucous membranes are moist.     Pharynx: Posterior oropharyngeal erythema present. No oropharyngeal exudate.  Cardiovascular:     Rate and Rhythm: Normal rate and regular rhythm.  Pulmonary:     Effort: Pulmonary effort is normal.     Breath sounds: Normal breath sounds.  Abdominal:     Palpations: Abdomen is soft.  Musculoskeletal:     Cervical back: Neck supple. Tenderness present.  Neurological:     Mental Status: She is alert.     UC Treatments / Results  Labs (all labs ordered are listed, but only abnormal results are displayed) Labs Reviewed  POCT RAPID STREP A, ED / UC  POC INFLUENZA A AND B ANTIGEN (URGENT CARE ONLY)    EKG   Radiology No results found.  Procedures Procedures (including critical care time)  Medications Ordered in UC Medications - No data to display  Initial Impression / Assessment and Plan / UC Course  I have reviewed the triage  vital signs and the nursing notes.  Pertinent labs & imaging results that were available during my care of the patient were reviewed by me and considered in my medical decision making (see chart for details).     Final Clinical Impressions(s) / UC Diagnoses   Final diagnoses:  Sore throat  Fever, unspecified  Acute non-recurrent ethmoidal sinusitis     Discharge Instructions      You were seen today for upper respiratory symptoms.  Your strep and flu tests were negative.  I am treating you today for sinus infection.  I have sent out augmentin twice/day x 10 days for this.  Please take with food to avoid upset stomach.  Please use tylenol and motrin to help with pain/fever.  You may use otc mucinex and sudafed for your symptoms as well.     ED Prescriptions   None    PDMP not reviewed this encounter.   Rondel Oh, MD 10/31/21 1436

## 2021-10-31 NOTE — Telephone Encounter (Signed)
Augmentin sent in, was part of the plan at dc from today's visit.

## 2021-10-31 NOTE — Discharge Instructions (Addendum)
You were seen today for upper respiratory symptoms.  Your strep and flu tests were negative.  I am treating you today for sinus infection.  I have sent out augmentin twice/day x 10 days for this.  Please take with food to avoid upset stomach.  Please use tylenol and motrin to help with pain/fever.  You may use otc mucinex and sudafed for your symptoms as well.

## 2021-11-17 ENCOUNTER — Other Ambulatory Visit: Payer: Self-pay

## 2021-11-20 ENCOUNTER — Other Ambulatory Visit: Payer: Self-pay

## 2021-11-23 ENCOUNTER — Telehealth: Payer: 59 | Admitting: Emergency Medicine

## 2021-11-23 DIAGNOSIS — J019 Acute sinusitis, unspecified: Secondary | ICD-10-CM

## 2021-11-23 MED ORDER — DOXYCYCLINE HYCLATE 100 MG PO TABS: 100.0000 mg | ORAL_TABLET | Freq: Two times a day (BID) | ORAL | 0 refills | Status: DC

## 2021-11-23 MED ORDER — PREDNISONE 20 MG PO TABS: 20.0000 mg | ORAL_TABLET | Freq: Every day | ORAL | 0 refills | Status: AC

## 2021-11-23 NOTE — Progress Notes (Signed)
Virtual Visit Consent   Rebecca Velasquez, you are scheduled for a virtual visit with a [facility] provider today.     Just as with appointments in the office, your consent must be obtained to participate.  Your consent will be active for this visit and any virtual visit you may have with one of our providers in the next 365 days.     If you have a MyChart account, a copy of this consent can be sent to you electronically.  All virtual visits are billed to your insurance company just like a traditional visit in the office.    As this is a virtual visit, video technology does not allow for your provider to perform a traditional examination.  This may limit your provider's ability to fully assess your condition.  If your provider identifies any concerns that need to be evaluated in person or the need to arrange testing (such as labs, EKG, etc.), we will make arrangements to do so.     Although advances in technology are sophisticated, we cannot ensure that it will always work on either your end or our end.  If the connection with a video visit is poor, the visit may have to be switched to a telephone visit.  With either a video or telephone visit, we are not always able to ensure that we have a secure connection.     I need to obtain your verbal consent now.   Are you willing to proceed with your visit today? Yes   Rebecca Velasquez has provided verbal consent on 11/23/2021 for a virtual visit (video or telephone).   Rebecca Velasquez, [place]   Date: 11/23/2021 3:59 PM   Virtual Visit via Video Note   I, Rebecca Velasquez, connected with  Rebecca Velasquez  ([id number], September 21, 1986) on 11/23/21 at  4:00 PM EST by a video-enabled telemedicine application and verified that I am speaking with the correct person using two identifiers.  Location: Patient: Virtual Visit Location Patient: Home Provider: Virtual Visit Location Provider: Home Office   I discussed the limitations of evaluation and management by  telemedicine and the availability of in person appointments. The patient expressed understanding and agreed to proceed.    History of Present Illness: Rebecca Velasquez is a 36 y.o. who identifies as a female who was assigned female at birth, and is being seen today for acute on chronic runny nose, congestion, cough, sinus pain and pressure x few weeks ago, worsening symptoms over the past 1-2 days  Denies known recent sick exposure to COVID, flu or strep.  Has tried OTC medications without relief.  Symptoms are made worse with worse with laying down.  Reports previous symptoms in the past with sinus infection.   Denies fever, chills, fatigue, sinus pain, rhinorrhea, sore throat, SOB, wheezing, chest pain, nausea, changes in bowel or bladder habits.     ROS: As per HPI.  All other pertinent ROS negative.     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    Allergies: No Known Allergies Medications:  Current Outpatient Medications:    doxycycline (VIBRA-TABS) 100 MG tablet, Take 1 tablet (100 mg total) by mouth 2 (two) times daily for 10 days., Disp: 20 tablet, Rfl: 0   predniSONE (DELTASONE) 20 MG tablet, Take 1 tablet (20 mg total) by mouth daily with breakfast for 5 days., Disp: 5  tablet, Rfl: 0   albuterol (PROVENTIL) (2.5 MG/3ML) 0.083% nebulizer solution, Take 3 mLs (2.5 mg total) by nebulization every 6 (six) hours as needed for wheezing or shortness of breath., Disp: 75 mL, Rfl: 12   albuterol (VENTOLIN HFA) 108 (90 Base) MCG/ACT inhaler, Inhale 1-2 puffs into the lungs every 6 (six) hours as needed for wheezing or shortness of breath., Disp: 18 g, Rfl: 0   DULoxetine (CYMBALTA) 30 MG capsule, Take 1 capsule (30 mg total) by mouth daily., Disp: 30 capsule, Rfl: 2   Omeprazole 20 MG TBEC, Take 20 mg by mouth daily as needed (acid reflux). , Disp: , Rfl:     traZODone (DESYREL) 100 MG tablet, Take 1 tablet (100 mg total) by mouth at bedtime., Disp: 30 tablet, Rfl: 2   traZODone (DESYREL) 100 MG tablet, TAKE 1 TABLET (100 MG TOTAL) BY MOUTH AT BEDTIME., Disp: 30 tablet, Rfl: 2  Observations/Objective: Patient is well-developed, well-nourished in no acute distress.  Resting comfortably at home. Appears uncomfortable but nontoxic Head is normocephalic, atraumatic.  No labored breathing. Speaking in full sentences and tolerating own secretions Speech is clear and coherent with logical content.  Patient is alert and oriented at baseline.  Assessment and Plan: 1. Acute non-recurrent sinusitis, unspecified location  Get plenty of rest and push fluids Doxycycline prescribed.  Take as directed and to completion Patient also requests prednisone for congestion- aware this may lower her immune response Use zyrtec for nasal congestion, runny nose, and/or sore throat Use flonase for nasal congestion and runny nose Use medications daily for symptom relief Use OTC medications like ibuprofen or tylenol as needed fever or pain Follow up with PCP Follow up in person at urgent care or go to the ED if you have any new or worsening symptoms such as fever, cough, shortness of breath, chest tightness, chest pain, turning blue, changes in mental status, etc...    Follow Up Instructions: I discussed the assessment and treatment plan with the patient. The patient was provided an opportunity to ask questions and all were answered. The patient agreed with the plan and demonstrated an understanding of the instructions.  A copy of instructions were sent to the patient via MyChart unless otherwise noted below.    The patient was advised to call back or seek an in-person evaluation if the symptoms worsen or if the condition fails to improve as anticipated.  Time:  I spent 5-10 minutes with the patient via telehealth technology discussing the above problems/concerns.     Rebecca Box, PA-C

## 2021-11-23 NOTE — Patient Instructions (Addendum)
°  Rebecca Velasquez, thank you for joining [name], PA-C for today's virtual visit.  While this provider is not your primary care provider (PCP), if your PCP is located in our provider database this encounter information will be shared with them immediately following your visit.  Consent: (Patient) Rebecca Velasquez provided verbal consent for this virtual visit at the beginning of the encounter.  Current Medications:  Current Outpatient Medications:    albuterol (PROVENTIL) (2.5 MG/3ML) 0.083% nebulizer solution, Take 3 mLs (2.5 mg total) by nebulization every 6 (six) hours as needed for wheezing or shortness of breath., Disp: 75 mL, Rfl: 12   albuterol (VENTOLIN HFA) 108 (90 Base) MCG/ACT inhaler, Inhale 1-2 puffs into the lungs every 6 (six) hours as needed for wheezing or shortness of breath., Disp: 18 g, Rfl: 0   DULoxetine (CYMBALTA) 30 MG capsule, Take 1 capsule (30 mg total) by mouth daily., Disp: 30 capsule, Rfl: 2   Omeprazole 20 MG TBEC, Take 20 mg by mouth daily as needed (acid reflux). , Disp: , Rfl:    traZODone (DESYREL) 100 MG tablet, Take 1 tablet (100 mg total) by mouth at bedtime., Disp: 30 tablet, Rfl: 2   traZODone (DESYREL) 100 MG tablet, TAKE 1 TABLET (100 MG TOTAL) BY MOUTH AT BEDTIME., Disp: 30 tablet, Rfl: 2   Medications ordered in this encounter:  No orders of the defined types were placed in this encounter.    *If you need refills on other medications prior to your next appointment, please contact your pharmacy*  Follow-Up: Call back or seek an in-person evaluation if the symptoms worsen or if the condition fails to improve as anticipated.  Other Instructions Get plenty of rest and push fluids Doxycycline prescribed.  Take as directed and to completion Patient also requests prednisone for congestion- aware this may lower her immune response Use zyrtec for nasal congestion, runny nose, and/or sore throat Use flonase for nasal congestion and runny nose Use  medications daily for symptom relief Use OTC medications like ibuprofen or tylenol as needed fever or pain Follow up with PCP Follow up in person at urgent care or go to the ED if you have any new or worsening symptoms such as fever, cough, shortness of breath, chest tightness, chest pain, turning blue, changes in mental status, etc...     If you have been instructed to have an in-person evaluation today at a local Urgent Care facility, please use the link below. It will take you to a list of all of our available [facility], including address, phone number and hours of operation. Please do not delay care.  [facility]  If you or a family member do not have a primary care provider, use the link below to schedule a visit and establish care. When you choose a [facility] primary care physician or advanced practice provider, you gain a long-term partner in health. Find a Primary Care Provider  Learn more about [facility]'s in-office and virtual care options: [facility]

## 2021-12-03 ENCOUNTER — Other Ambulatory Visit: Payer: Self-pay

## 2021-12-03 ENCOUNTER — Encounter: Payer: Self-pay | Admitting: Family Medicine

## 2021-12-03 ENCOUNTER — Ambulatory Visit: Payer: 59 | Admitting: Family Medicine

## 2021-12-03 VITALS — BP 121/81 | HR 72 | Temp 97.7°F | Ht 61.0 in | Wt 231.6 lb

## 2021-12-03 DIAGNOSIS — F330 Major depressive disorder, recurrent, mild: Secondary | ICD-10-CM

## 2021-12-03 DIAGNOSIS — R739 Hyperglycemia, unspecified: Secondary | ICD-10-CM | POA: Diagnosis not present

## 2021-12-03 DIAGNOSIS — F17200 Nicotine dependence, unspecified, uncomplicated: Secondary | ICD-10-CM | POA: Insufficient documentation

## 2021-12-03 DIAGNOSIS — J4520 Mild intermittent asthma, uncomplicated: Secondary | ICD-10-CM | POA: Diagnosis not present

## 2021-12-03 DIAGNOSIS — F17210 Nicotine dependence, cigarettes, uncomplicated: Secondary | ICD-10-CM | POA: Diagnosis not present

## 2021-12-03 DIAGNOSIS — N809 Endometriosis, unspecified: Secondary | ICD-10-CM

## 2021-12-03 DIAGNOSIS — J329 Chronic sinusitis, unspecified: Secondary | ICD-10-CM

## 2021-12-03 DIAGNOSIS — K219 Gastro-esophageal reflux disease without esophagitis: Secondary | ICD-10-CM

## 2021-12-03 DIAGNOSIS — Z9884 Bariatric surgery status: Secondary | ICD-10-CM

## 2021-12-03 DIAGNOSIS — F411 Generalized anxiety disorder: Secondary | ICD-10-CM

## 2021-12-03 DIAGNOSIS — Z13220 Encounter for screening for lipoid disorders: Secondary | ICD-10-CM | POA: Diagnosis not present

## 2021-12-03 DIAGNOSIS — J45909 Unspecified asthma, uncomplicated: Secondary | ICD-10-CM | POA: Insufficient documentation

## 2021-12-03 LAB — COMPREHENSIVE METABOLIC PANEL
ALT: 14 U/L (ref 0–35)
AST: 16 U/L (ref 0–37)
Albumin: 4.1 g/dL (ref 3.5–5.2)
Alkaline Phosphatase: 86 U/L (ref 39–117)
BUN: 9 mg/dL (ref 6–23)
CO2: 29 mEq/L (ref 19–32)
Calcium: 9.7 mg/dL (ref 8.4–10.5)
Chloride: 105 mEq/L (ref 96–112)
Creatinine, Ser: 0.57 mg/dL (ref 0.40–1.20)
GFR: 118.68 mL/min (ref >60.00)
Glucose, Bld: 91 mg/dL (ref 70–99)
Potassium: 4.5 mEq/L (ref 3.5–5.1)
Sodium: 142 mEq/L (ref 135–145)
Total Bilirubin: 0.4 mg/dL (ref 0.2–1.2)
Total Protein: 6.6 g/dL (ref 6.0–8.3)

## 2021-12-03 LAB — LIPID PANEL
Cholesterol: 193 mg/dL (ref 0–200)
HDL: 45.9 mg/dL (ref >39.00)
LDL Cholesterol: 128 mg/dL — ABNORMAL HIGH (ref 0–99)
NonHDL: 146.99
Total CHOL/HDL Ratio: 4
Triglycerides: 97 mg/dL (ref 0.0–149.0)
VLDL: 19.4 mg/dL (ref 0.0–40.0)

## 2021-12-03 LAB — CBC
HCT: 37.7 % (ref 36.0–46.0)
Hemoglobin: 12.6 g/dL (ref 12.0–15.0)
MCHC: 33.4 g/dL (ref 30.0–36.0)
MCV: 87.8 fl (ref 78.0–100.0)
Platelets: 324 10*3/uL (ref 150.0–400.0)
RBC: 4.3 Mil/uL (ref 3.87–5.11)
RDW: 14.7 % (ref 11.5–15.5)
WBC: 7.2 10*3/uL (ref 4.0–10.5)

## 2021-12-03 LAB — HEMOGLOBIN A1C: Hgb A1c MFr Bld: 5.6 % (ref 4.6–6.5)

## 2021-12-03 LAB — TSH: TSH: 0.6 u[IU]/mL (ref 0.35–5.50)

## 2021-12-03 MED ORDER — OMEPRAZOLE 20 MG PO TBEC: 20.0000 mg | DELAYED_RELEASE_TABLET | Freq: Every day | ORAL | 3 refills | Status: DC | PRN

## 2021-12-03 MED ORDER — ALBUTEROL SULFATE HFA 108 (90 BASE) MCG/ACT IN AERS: 1.0000 | INHALATION_SPRAY | Freq: Four times a day (QID) | RESPIRATORY_TRACT | 0 refills | Status: DC | PRN

## 2021-12-03 MED ORDER — OZEMPIC (0.25 OR 0.5 MG/DOSE) 2 MG/1.5ML ~~LOC~~ SOPN
0.5000 mg | PEN_INJECTOR | SUBCUTANEOUS | 0 refills | Status: DC
Start: 2021-12-03 — End: 2022-01-07

## 2021-12-03 MED ORDER — GABAPENTIN 100 MG PO CAPS: 100.0000 mg | ORAL_CAPSULE | Freq: Three times a day (TID) | ORAL | 3 refills | Status: DC

## 2021-12-03 NOTE — Assessment & Plan Note (Signed)
Symptoms currently well controlled on Cymbalta 30 mg daily and trazodone 100 mg nightly. ?

## 2021-12-03 NOTE — Assessment & Plan Note (Signed)
Stable.  Refilled omeprazole 20 mg daily. ?

## 2021-12-03 NOTE — Assessment & Plan Note (Signed)
Doing well on Cymbalta 30 mg daily and trazodone 100 mg nightly. ?

## 2021-12-03 NOTE — Assessment & Plan Note (Signed)
BMI 43.  We discussed lifestyle modifications.  We will start Ozempic.  Check labs today.  She will follow-up in a few weeks via MyChart.  We can titrate the dose as needed. ?

## 2021-12-03 NOTE — Assessment & Plan Note (Signed)
We will refer to OB/GYN in the area.  She has not seen GYN since moving to [place] a few years ago. ?

## 2021-12-03 NOTE — Patient Instructions (Signed)
It was very nice to see you today! ? ?We will refer you to ear nose and throat for your sinuses. ? ?I will refer you to see a gynecologist for your endometriosis. ? ?We will check blood work today. ? ?I will refill your omeprazole and albuterol. ? ?Please restart the gabapentin.  Take 100 mg 3 times daily. ? ?We will also start Ozempic.  Please take 0.25 mg weekly for 4 weeks and then increase to 0.5 mg weekly. ? ?Please send me a message in a couple of weeks to let me know how you are doing. ? ?We can consider starting Wellbutrin soon if you do well with these other medication changes. ? ?Take care, ?Dr [name] ? ?PLEASE NOTE: ? ?If you had any lab tests please let us know if you have not heard back within a few days. You may see your results on mychart before we have a chance to review them but we will give you a call once they are reviewed by [place]. If we ordered any referrals today, please let us know if you have not heard from their office within the next week.  ? ?Please try these tips to maintain a healthy lifestyle: ? ?Eat at least 3 REAL meals and 1-2 snacks per day.  Aim for no more than 5 hours between eating.  If you eat breakfast, please do so within one hour of getting up.  ? ?Each meal should contain half fruits/vegetables, one quarter protein, and one quarter carbs (no bigger than a computer mouse) ? ?Cut down on sweet beverages. This includes juice, soda, and sweet tea.  ? ?Drink at least 1 glass of water with each meal and aim for at least 8 glasses per day ? ?Exercise at least 150 minutes every week.   ? ?

## 2021-12-03 NOTE — Assessment & Plan Note (Signed)
No recent flares.  We will refill her albuterol.  She uses as needed sporadically. ?

## 2021-12-03 NOTE — Progress Notes (Signed)
? ?Rebecca Velasquez is a 36 y.o. female who presents today for an office visit. She is a new patient.  ? ?Assessment/Plan:  ?New/Acute Problems: ?Sinusitis ?Has not responded adequately to multiple rounds of antibiotics and prednisone.  We will refer to ENT for further evaluation. ? ?Chronic Problems Addressed Today: ?Nicotine dependence with current use ?Patient was asked about her tobacco use today and was strongly advised to quit. Patient is currently contemplative. We reviewed treatment options to assist her quit smoking including NRT, Chantix, and Bupropion. Follow up at next office visit.  She may be interested in start bupropion.  We will defer starting for now as we are starting several other medications as below. ? ?Total time spent counseling approximately 3 minutes.  ? ? ?[name] ?BMI 43.  We discussed lifestyle modifications.  We will start Ozempic.  Check labs today.  She will follow-up in a few weeks via MyChart.  We can titrate the dose as needed. ? ?Endometriosis ?We will refer to OB/GYN in the area.  She has not seen GYN since moving to [place] a few years ago. ? ?GERD (gastroesophageal reflux disease) ?Stable.  Refilled omeprazole 20 mg daily. ? ?Asthma ?No recent flares.  We will refill her albuterol.  She uses as needed sporadically. ? ?Mild episode of recurrent major depressive disorder ([place]) ?Symptoms currently well controlled on Cymbalta 30 mg daily and trazodone 100 mg nightly. ? ?Generalized anxiety disorder ?Doing well on Cymbalta 30 mg daily and trazodone 100 mg nightly. ? ? ?  ?Subjective:  ?HPI: ? ?See A/p for status of chronic conditions. ? ?She has also had issues with persistent sinusitis for the last 2 months.  She does have occasional issues with seasonal allergies. She has been on multiple rounds of antibiotics the last couple of months including augmentin and doxycycline. She was also given a prednisone burst which seemed to help the symptoms have persisted.  Feels  a lot of facial pressure.  Some pain.  Some puffiness.  No reported fevers or chills. ? ?She is also concerned about her weight.  She had a gastric bypass done several years ago and has lost quite a bit of weight with this however she seems to have plateaued recently.  She has been cutting out soda and sugar which seems to help.  She is very active at work. ? ?ROS: Per HPI, otherwise a complete review of systems was negative.  ? ?PMH: ? ?The following were reviewed and entered/updated in epic: ?Past Medical History:  ?Diagnosis Date  ? Asthma   ? Celiac disease   ? Depression   ? Endometriosis   ? ?Patient Active Problem List  ? Diagnosis Date Noted  ? Asthma 12/03/2021  ? GERD (gastroesophageal reflux disease) 12/03/2021  ? Endometriosis 12/03/2021  ? Morbid obesity ([name]) 12/03/2021  ? 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 Nicotine dependence with current use 12/03/2021  ? Generalized anxiety disorder 07/08/2020  ? Mild episode of recurrent major depressive disorder ([name]) 07/08/2020  ? ?Past Surgical History:  ?Procedure Laterality Date  ? GASTRIC BYPASS OPEN  2016  ? IVC FILTER INSERTION  2016  ? ? ?Family History  ?Problem Relation Age of Onset  ? Rheum arthritis Mother   ? Cancer Mother   ? Diabetes Father   ? Cancer Father   ? ? ?Medications- reviewed and updated ?Current Outpatient Medications  ?Medication Sig Dispense Refill  ? DULoxetine (CYMBALTA) 30 MG capsule Take 1 capsule (30 mg total) by mouth daily.  30 capsule 2  ? gabapentin (NEURONTIN) 100 MG capsule Take 1 capsule (100 mg total) by mouth 3 (three) times daily. 90 capsule 3  ? Semaglutide,0.25 or 0.5MG/DOS, (OZEMPIC, 0.25 OR 0.5 MG/DOSE,) 2 MG/1.5ML SOPN Inject 0.5 mg into the skin once a week. 1.5 mL 0  ? traZODone (DESYREL) 100 MG tablet Take 1 tablet (100 mg total) by mouth at bedtime. 30 tablet 2  ? albuterol (VENTOLIN HFA) 108 (90 Base) MCG/ACT inhaler Inhale 1-2 puffs into the lungs every 6 (six) hours as needed for wheezing or shortness of breath.  18 g 0  ? Omeprazole 20 MG TBEC Take 1 tablet (20 mg total) by mouth daily as needed (acid reflux). 90 tablet 3  ? ?No current facility-administered medications for this visit.  ? ? ?Allergies-reviewed and updated ?No Known Allergies ? ?Social History  ? ?Socioeconomic History  ? Marital status: Single  ?  Spouse name: Not on file  ? Number of children: Not on file  ? Years of education: Not on file  ? Highest education level: Not on file  ?Occupational History  ? Not on file  ?Tobacco Use  ? Smoking status: Every Day  ?  Packs/day: 0.50  ?  Types: Cigarettes  ? Smokeless tobacco: Never  ? Tobacco comments:  ?  Pt interested in quitting smoking  ?Vaping Use  ? Vaping Use: Never used  ?Substance and Sexual Activity  ? Alcohol use: Never  ? Drug use: Yes  ?  Types: Marijuana  ? Sexual activity: Yes  ?  Birth control/protection: Condom  ?Other Topics Concern  ? Not on file  ?Social History Narrative  ? Not on file  ? ?Social Determinants of Health  ? ?Financial Resource Strain: Not on file  ?Food Insecurity: Not on file  ?Transportation Needs: Not on file  ?Physical Activity: Not on file  ?Stress: Not on file  ?Social Connections: Not on file  ? ? ? ? ? ?   ?  ?Objective:  ?Physical Exam: ?BP 121/81 (BP Location: Right Arm)   Pulse 72   Temp 97.7 ?F (36.5 ?C) (Temporal)   Ht 5' 1"  (1.549 m)   Wt 231 lb 9.6 oz (105.1 kg)   SpO2 96%   BMI 43.76 kg/m?   ?Gen: No acute distress, resting comfortably ?HEENT: TMs with clear effusion.  OP erythematous.  Nasal mucosa erythematous and boggy with clear discharge. ?CV: Regular rate and rhythm with no murmurs appreciated ?Pulm: Normal work of breathing, clear to auscultation bilaterally with no crackles, wheezes, or rhonchi ?Neuro: Grossly normal, moves all extremities ?Psych: Normal affect and thought content ? ?   ? ?Algis Greenhouse. [name], MD ?12/03/2021 9:05 AM  ?

## 2021-12-03 NOTE — Assessment & Plan Note (Signed)
Patient was asked about her tobacco use today and was strongly advised to quit. Patient is currently contemplative. We reviewed treatment options to assist her quit smoking including NRT, Chantix, and Bupropion. Follow up at next office visit.  She may be interested in start bupropion.  We will defer starting for now as we are starting several other medications as below. ? ?Total time spent counseling approximately 3 minutes.  ? ?

## 2021-12-05 NOTE — Progress Notes (Signed)
Please inform patient of the following: ? ?Her "bad" cholesterol is slightly elevated but everything else is stable.  Do not need to make any changes to her treatment plan at this time.  She should continue to work on diet and exercise and we can recheck in about a year.

## 2021-12-24 ENCOUNTER — Ambulatory Visit: Payer: Self-pay | Admitting: Family Medicine

## 2021-12-25 ENCOUNTER — Other Ambulatory Visit: Payer: Self-pay

## 2021-12-25 ENCOUNTER — Other Ambulatory Visit (HOSPITAL_COMMUNITY): Payer: Self-pay | Admitting: Psychiatry

## 2021-12-25 IMAGING — DX DG LUMBAR SPINE COMPLETE 4+V
5 series · 5 of 5 positions shown · non-contrast
Comparison: Bone window images from CT abdomen and pelvis
08/30/2019. Lumbar spine x-rays 04/28/2018.

CLINICAL DATA: 33-year-old presenting with acute onset of low back
pain that started last week. No known injuries.

EXAM:
LUMBAR SPINE - COMPLETE 4+ VIEW

[l-spine ap]
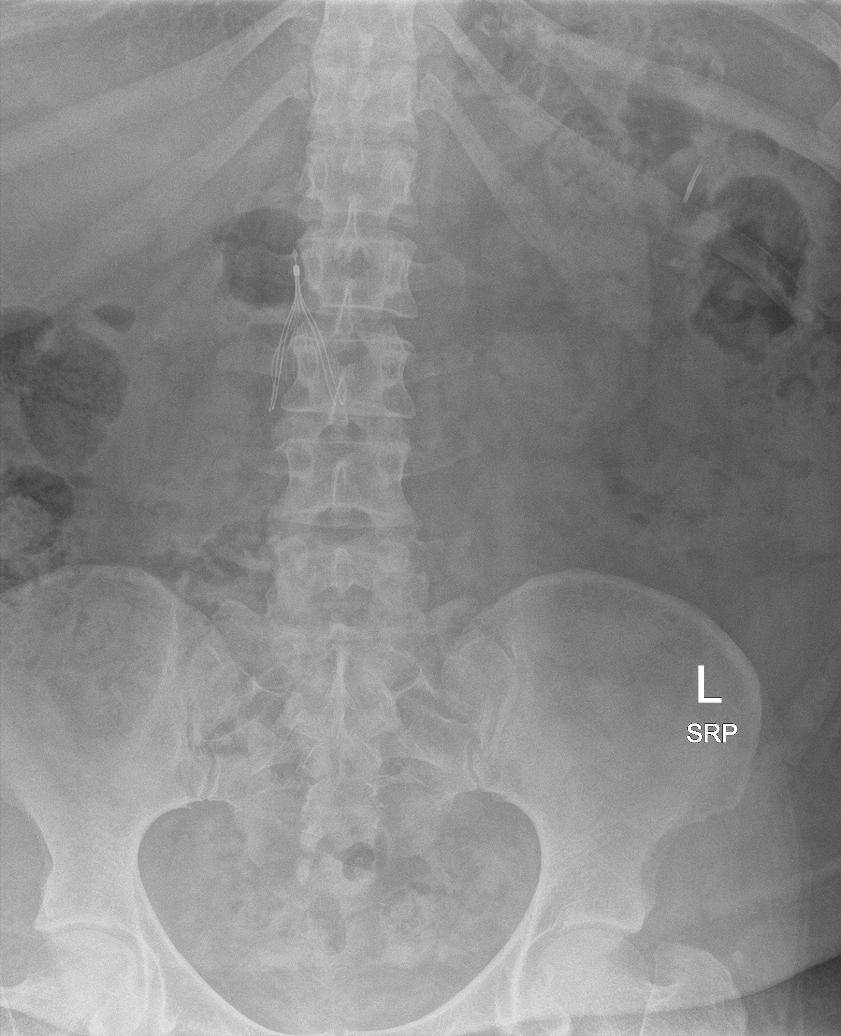

[l-spine obl (1 of 2)]
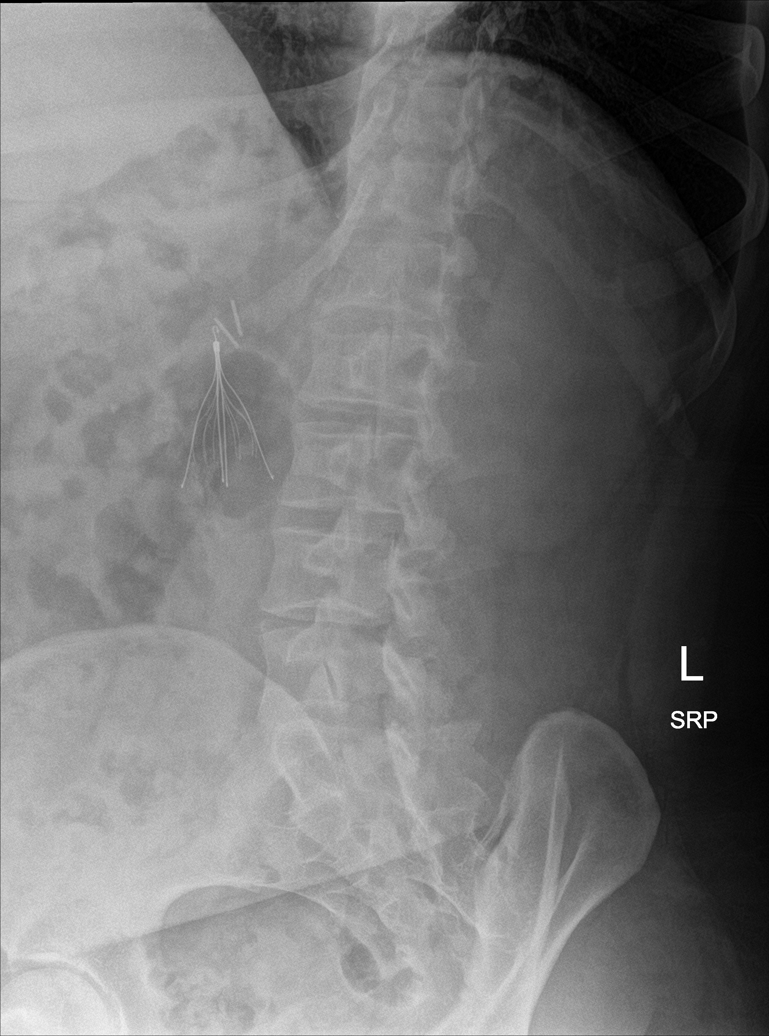

[l-spine obl (2 of 2)]
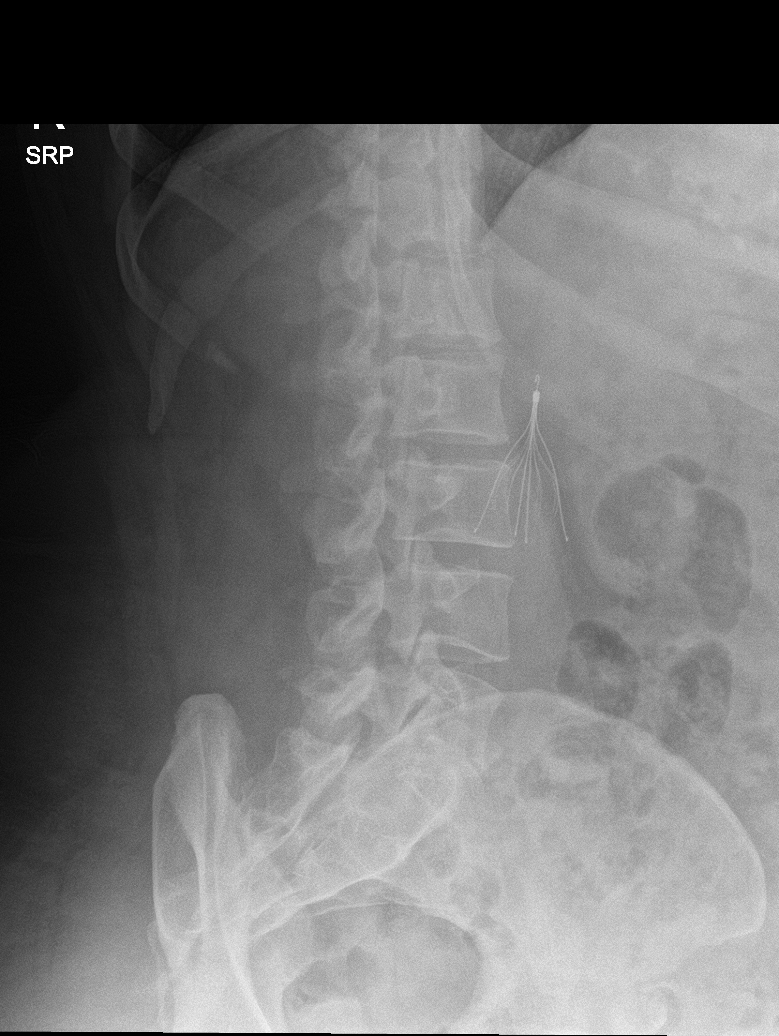

[l-spine lat]
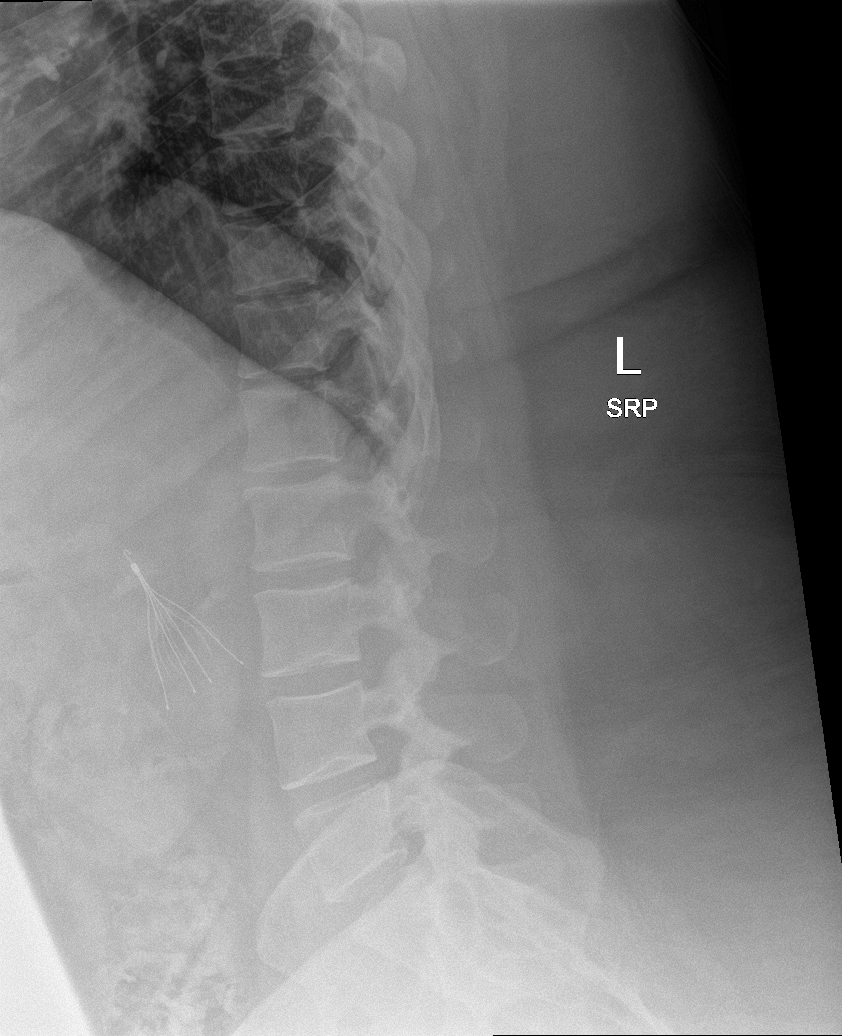

[l-spine spot]
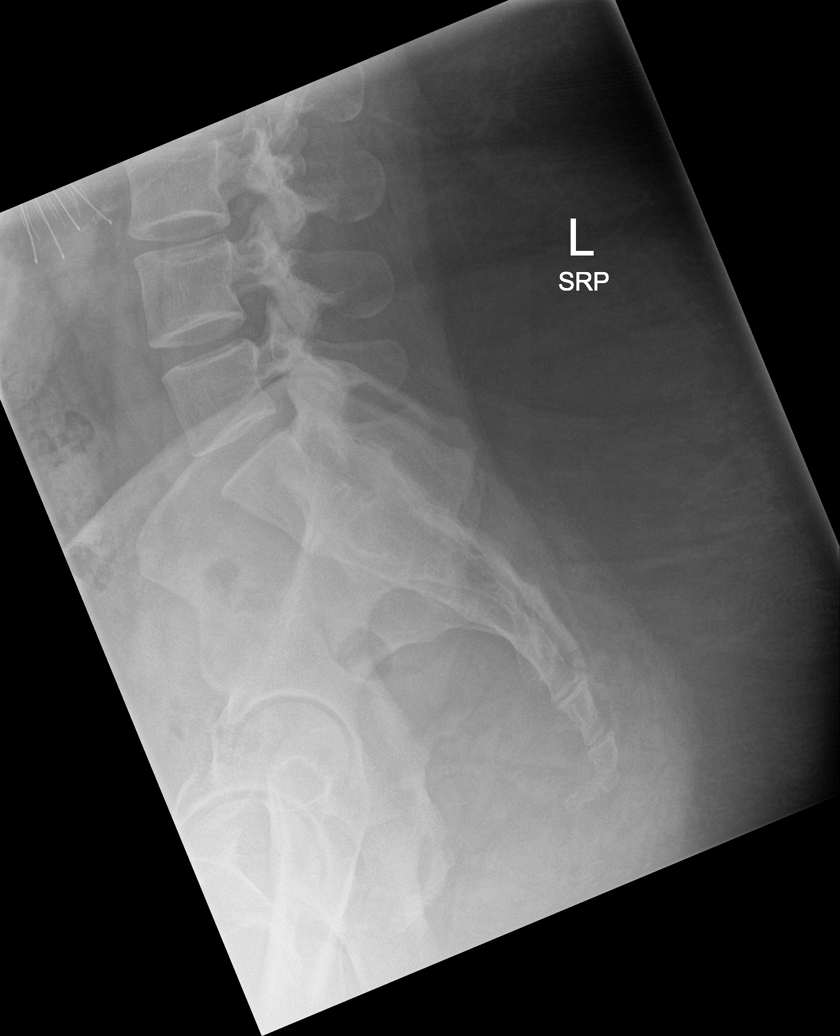

[5 of 5 positions shown; findings below may reference images not displayed]

FINDINGS: Five non-rib-bearing lumbar vertebrae with a vestigial S1-S2 disc
space. Anatomic posterior alignment. Straightening of the usual
lumbar lordosis. No fractures. Well-preserved disc spaces. No pars
defects. No significant facet arthropathy. Sacroiliac joints
anatomically aligned without significant degenerative changes. IVC
filter again noted.
IMPRESSION: Straightening of the usual lumbar lordosis which may reflect
positioning and/or spasm. Otherwise normal examination.

## 2021-12-31 ENCOUNTER — Other Ambulatory Visit: Payer: Self-pay

## 2022-01-01 ENCOUNTER — Other Ambulatory Visit: Payer: Self-pay | Admitting: *Deleted

## 2022-01-01 ENCOUNTER — Other Ambulatory Visit: Payer: Self-pay | Admitting: Family Medicine

## 2022-01-01 ENCOUNTER — Telehealth: Payer: Self-pay

## 2022-01-01 DIAGNOSIS — F330 Major depressive disorder, recurrent, mild: Secondary | ICD-10-CM

## 2022-01-01 DIAGNOSIS — F411 Generalized anxiety disorder: Secondary | ICD-10-CM

## 2022-01-01 MED ORDER — TRAZODONE HCL 100 MG PO TABS: 100.0000 mg | ORAL_TABLET | Freq: Every day | ORAL | 2 refills | Status: DC

## 2022-01-01 NOTE — Telephone Encounter (Signed)
Please advise 

## 2022-01-01 NOTE — Telephone Encounter (Signed)
Ok to refill. ? ?Rebecca Velasquez. [name], MD ?01/01/2022 2:31 PM  ? ?

## 2022-01-01 NOTE — Telephone Encounter (Signed)
Patient called in stating that her therapist has informed her that since she had an MD she will no longer prescribe trazodone.  Patient states she is currently out of this medication. She is asking Dr. [name] to fill this medication.  [facility] [address] [facility] [place].

## 2022-01-01 NOTE — Telephone Encounter (Signed)
Rx sent to the pharmacy as requested. ?

## 2022-01-06 ENCOUNTER — Other Ambulatory Visit: Payer: Self-pay | Admitting: Family Medicine

## 2022-01-07 ENCOUNTER — Other Ambulatory Visit: Payer: Self-pay | Admitting: Family Medicine

## 2022-01-07 MED ORDER — ONDANSETRON HCL 8 MG PO TABS: 8.0000 mg | ORAL_TABLET | Freq: Three times a day (TID) | ORAL | 0 refills | Status: DC | PRN

## 2022-01-24 ENCOUNTER — Other Ambulatory Visit: Payer: Self-pay | Admitting: Family Medicine

## 2022-01-24 DIAGNOSIS — F411 Generalized anxiety disorder: Secondary | ICD-10-CM

## 2022-01-24 DIAGNOSIS — F330 Major depressive disorder, recurrent, mild: Secondary | ICD-10-CM

## 2022-01-25 ENCOUNTER — Encounter: Payer: Self-pay | Admitting: Family Medicine

## 2022-01-25 ENCOUNTER — Other Ambulatory Visit (HOSPITAL_COMMUNITY): Payer: Self-pay | Admitting: Psychiatry

## 2022-01-26 NOTE — Telephone Encounter (Signed)
Please advise 

## 2022-01-27 ENCOUNTER — Other Ambulatory Visit: Payer: Self-pay

## 2022-01-27 NOTE — Telephone Encounter (Signed)
Orders are pending for patient; called and lvm to return our call to confirm pharmacy she is wanting medication sent to before sending.  ?

## 2022-01-27 NOTE — Telephone Encounter (Signed)
Ok with referral to psychiatry and refill on her cymbalta. ? ?Algis Greenhouse. [name], MD ?01/27/2022 9:08 AM  ? ?

## 2022-01-28 ENCOUNTER — Ambulatory Visit: Payer: 59 | Admitting: Family Medicine

## 2022-01-28 ENCOUNTER — Encounter: Payer: Self-pay | Admitting: Physician Assistant

## 2022-01-28 ENCOUNTER — Ambulatory Visit: Payer: 59 | Admitting: Physician Assistant

## 2022-01-28 VITALS — BP 120/80 | HR 77 | Temp 97.7°F | Ht 61.0 in | Wt 221.2 lb

## 2022-01-28 DIAGNOSIS — M25571 Pain in right ankle and joints of right foot: Secondary | ICD-10-CM

## 2022-01-28 DIAGNOSIS — Z9884 Bariatric surgery status: Secondary | ICD-10-CM | POA: Diagnosis not present

## 2022-01-28 MED ORDER — TRAMADOL HCL 50 MG PO TABS: 50.0000 mg | ORAL_TABLET | Freq: Three times a day (TID) | ORAL | 0 refills | Status: DC | PRN

## 2022-01-28 MED ORDER — PREDNISONE 20 MG PO TABS: 40.0000 mg | ORAL_TABLET | Freq: Every day | ORAL | 0 refills | Status: DC

## 2022-01-28 MED ORDER — DULOXETINE HCL 30 MG PO CPEP: 30.0000 mg | ORAL_CAPSULE | Freq: Every day | ORAL | 2 refills | Status: DC

## 2022-01-28 NOTE — Telephone Encounter (Signed)
Patient seen in the office today by Dr [name]; meds have been sent for pt.  ?

## 2022-01-28 NOTE — Patient Instructions (Signed)
It was great to see you! ? ?Start prednisone for your inflammation ?Start tramadol for breakthrough pain ?Continue prilosec ? ?Trial topical voltaren for your symptoms ? ?Continue the brace, rest, ice, elevation ? ?If worsening symptoms, let us know and we will get you to Sports Medicine ? ?Take care, ? ?Inda Coke PA-C  ?

## 2022-01-28 NOTE — Progress Notes (Signed)
Rebecca Velasquez is a 36 y.o. female here for a new problem. ? ?History of Present Illness:  ? ?Chief Complaint  ?Patient presents with  ? Ankle Pain  ?  Pt c/o right ankle pain x 3 days, she has been icing and elevating it. Taking Ibuprofen 800 mg 3 x's a day.  ? ? ?Her sister is present with her today. ? ?HPI ? ?L ankle pain ?Patient reports sudden onset L ankle pain Sunday. She is unsure of what she may have done. 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 She has been taking ibuprofen 800 mg TID, ice, elevation. She worked all day Monday but called out the rest of the week. ? ?She went to [facility] yesterday at their acute care clinic -- they did xray, said it wasn't broken and that she had tendonitis. ? ?She has hx of gastric bypass -- has hx of going to ER for gastritis due to ibuprofen use after this. She does take daily omeprazole. ? ?Denies: hx of seizures, addiction, DM, mania, inability to bear weight on ankle, rectal bleeding, dark stools ? ?Past Medical History:  ?Diagnosis Date  ? Asthma   ? Celiac disease   ? Depression   ? Endometriosis   ? ?  ?Social History  ? ?Tobacco Use  ? Smoking status: Every Day  ?  Packs/day: 0.50  ?  Types: Cigarettes  ? Smokeless tobacco: Never  ? Tobacco comments:  ?  Pt interested in quitting smoking  ?Vaping Use  ? Vaping Use: Never used  ?Substance Use Topics  ? Alcohol use: Never  ? Drug use: Yes  ?  Types: Marijuana  ? ? ?Past Surgical History:  ?Procedure Laterality Date  ? GASTRIC BYPASS OPEN  2016  ? IVC FILTER INSERTION  2016  ? ? ?Family History  ?Problem Relation Age of Onset  ? Rheum arthritis Mother   ? Cancer Mother   ? Diabetes Father   ? Cancer Father   ? ? ?No Known Allergies ? ?Current Medications:  ? ?Current Outpatient Medications:  ?  albuterol (VENTOLIN HFA) 108 (90 Base) MCG/ACT inhaler, INHALE 1-2 PUFFS BY MOUTH EVERY 6 HOURS AS NEEDED FOR WHEEZE OR SHORTNESS OF BREATH, Disp: 8.5 each, Rfl: 1 ?  gabapentin (NEURONTIN) 100 MG capsule, Take 1  capsule (100 mg total) by mouth 3 (three) times daily., Disp: 90 capsule, Rfl: 3 ?  Omeprazole 20 MG TBEC, Take 1 tablet (20 mg total) by mouth daily as needed (acid reflux)., Disp: 90 tablet, Rfl: 3 ?  ondansetron (ZOFRAN) 8 MG tablet, Take 1 tablet (8 mg total) by mouth every 8 (eight) hours as needed for nausea or vomiting., Disp: 20 tablet, Rfl: 0 ?  OZEMPIC, 0.25 OR 0.5 MG/DOSE, 2 MG/3ML SOPN, INJECT 0.5 MG INTO THE SKIN ONCE A WEEK., Disp: 3 mL, Rfl: 0 ?  traZODone (DESYREL) 100 MG tablet, TAKE 1 TABLET BY MOUTH EVERYDAY AT BEDTIME, Disp: 90 tablet, Rfl: 1 ?  DULoxetine (CYMBALTA) 30 MG capsule, Take 1 capsule (30 mg total) by mouth daily., Disp: 30 capsule, Rfl: 2  ? ?Review of Systems:  ? ?ROS ?Negative unless otherwise specified per HPI. ? ? ?Vitals:  ? ?Vitals:  ? 01/28/22 0852  ?BP: 120/80  ?Pulse: 77  ?Temp: 97.7 ?F (36.5 ?C)  ?TempSrc: Temporal  ?SpO2: 97%  ?Weight: 221 lb 4 oz (100.4 kg)  ?Height: 5' 1"  (1.549 m)  ?   ?Body mass index is 41.8 kg/m?. ? ?Physical  Exam:  ? ?Physical Exam ?Constitutional:   ?   Appearance: Normal appearance. She is well-developed.  ?HENT:  ?   Head: Normocephalic and atraumatic.  ?Eyes:  ?   General: Lids are normal.  ?   Extraocular Movements: Extraocular movements intact.  ?   Conjunctiva/sclera: Conjunctivae normal.  ?Pulmonary:  ?   Effort: Pulmonary effort is normal.  ?Musculoskeletal:     ?   General: Normal range of motion.  ?   Cervical back: Normal range of motion and neck supple.  ?   Comments: R ankle: ?Limited ROM due to pain ?No significant swelling or ecchymosis ?TTP to medial malleolus and posterior aspect of medial malleolus  ?Skin: ?   General: Skin is warm and dry.  ?Neurological:  ?   Mental Status: She is alert and oriented to person, place, and time.  ?Psychiatric:     ?   Attention and Perception: Attention and perception normal.     ?   Mood and Affect: Mood normal. Affect is tearful.     ?   Behavior: Behavior normal.     ?   Thought Content:  Thought content normal.     ?   Judgment: Judgment normal.  ? ? ?Assessment and Plan:  ? ?Acute right ankle pain ?New ?Already evaluated at [facility] and has had films; was told she has tendonitis ?Will trial oral prednisone ?For breakthrough pain, may use tramadol (PDMP reviewed -- no red flags) ?Work note for the week ?Continue RICE ?If worsening or lack of improvement, will refer to sports medicine ? ?S/P gastric bypass ?Discussed need to stop ibuprofen ASAP ?Continue daily omeprazole ?If worsening stomach pain, presence of GI bleeding, or other concerns, needs to go to ER ? ?Inda Coke, PA-C ?

## 2022-02-02 ENCOUNTER — Telehealth: Payer: Self-pay | Admitting: Family Medicine

## 2022-02-02 ENCOUNTER — Encounter: Payer: Self-pay | Admitting: Family Medicine

## 2022-02-02 MED ORDER — TRAMADOL HCL 50 MG PO TABS: 50.0000 mg | ORAL_TABLET | Freq: Three times a day (TID) | ORAL | 0 refills | Status: DC | PRN

## 2022-02-02 NOTE — Telephone Encounter (Signed)
Please give patient a call as to where this printed script is going at [phone].  [facility] [place].

## 2022-02-02 NOTE — Telephone Encounter (Signed)
Pt [facility] [place] [facility] [address] please ?

## 2022-02-02 NOTE — Addendum Note (Signed)
Addended by: Vivi Barrack on: 02/02/2022 03:07 PM ? ? Modules accepted: Orders ? ?

## 2022-02-02 NOTE — Telephone Encounter (Signed)
Not received anything as of yet from pharmacy to submit PA, advised pt would let her know once it was submitted ?

## 2022-02-02 NOTE — Telephone Encounter (Signed)
Please advise 

## 2022-02-02 NOTE — Telephone Encounter (Signed)
Rx printed out. ? ?Algis Greenhouse. [name], MD ?02/02/2022 2:39 PM  ? ?

## 2022-02-02 NOTE — Telephone Encounter (Signed)
Per patient Tramadol 27m needs a prior auth- [facility]  ? ? ?

## 2022-02-03 ENCOUNTER — Telehealth: Payer: Self-pay | Admitting: *Deleted

## 2022-02-03 NOTE — Telephone Encounter (Signed)
Key: [id number] - PA Case ID: [id number] - Rx #: [id number] ?Outcome ?Approved today ?Your PA request has been approved. from 02/03/2022 to 02/03/2023 ?Drug ?traMADol HCl 50MG tablets ?Pharmacy notified ?Notification printed and placed to be scan in patient chart  ?

## 2022-02-04 ENCOUNTER — Other Ambulatory Visit: Payer: Self-pay | Admitting: Family Medicine

## 2022-02-16 ENCOUNTER — Telehealth: Payer: 59 | Admitting: Emergency Medicine

## 2022-02-16 DIAGNOSIS — J069 Acute upper respiratory infection, unspecified: Secondary | ICD-10-CM

## 2022-02-16 DIAGNOSIS — J45901 Unspecified asthma with (acute) exacerbation: Secondary | ICD-10-CM

## 2022-02-16 MED ORDER — PREDNISONE 20 MG PO TABS: 40.0000 mg | ORAL_TABLET | Freq: Every day | ORAL | 0 refills | Status: DC

## 2022-02-16 MED ORDER — FLUTICASONE PROPIONATE 50 MCG/ACT NA SUSP: 2.0000 | Freq: Every day | NASAL | 0 refills | Status: DC

## 2022-02-16 NOTE — Progress Notes (Signed)
Virtual Visit Consent   Rebecca Velasquez, you are scheduled for a virtual visit with a [facility] provider today. Just as with appointments in the office, your consent must be obtained to participate. Your consent will be active for this visit and any virtual visit you may have with one of our providers in the next 365 days. If you have a MyChart account, a copy of this consent can be sent to you electronically.  As this is a virtual visit, video technology does not allow for your provider to perform a traditional examination. This may limit your provider's ability to fully assess your condition. If your provider identifies any concerns that need to be evaluated in person or the need to arrange testing (such as labs, EKG, etc.), we will make arrangements to do so. Although advances in technology are sophisticated, we cannot ensure that it will always work on either your end or our end. If the connection with a video visit is poor, the visit may have to be switched to a telephone visit. With either a video or telephone visit, we are not always able to ensure that we have a secure connection.  By engaging in this virtual visit, you consent to the provision of healthcare and authorize for your insurance to be billed (if applicable) for the services provided during this visit. Depending on your insurance coverage, you may receive a charge related to this service.  I need to obtain your verbal consent now. Are you willing to proceed with your visit today? Rebecca Velasquez has provided verbal consent on 02/16/2022 for a virtual visit (video or telephone). Carvel Getting, NP  Date: 02/16/2022 11:29 AM  Virtual Visit via Video Note   I, Carvel Getting, connected with  Rebecca Velasquez  ([id number], June 07, 1986) on 02/16/22 at 11:15 AM EDT by a video-enabled telemedicine application and verified that I am speaking with the correct person using two identifiers.  Location: Patient: Virtual Visit Location Patient:  Home Provider: Virtual Visit Location Provider: Home Office   I discussed the limitations of evaluation and management by telemedicine and the availability of in person appointments. The patient expressed understanding and agreed to proceed.    History of Present Illness: Rebecca Velasquez is a 36 y.o. who identifies as a female who was assigned female at birth, and is being seen today for congestion and wheezing.  Patient reports her symptoms started several days ago.  She initially thought she had allergies and her symptoms of runny nose, mild scratchy throat got better and went away.  But then they returned and now she feels much worse.  She complains of nasal congestion, postnasal drainage, sinus pressure in her bilateral maxillary sinus area, cough, wheezing, and mild shortness of breath.  She is using her albuterol inhaler 2 puffs every 2 hours with some temporary relief of wheezing.  She denies fever or chills.  She does feel a little achy.  Her postnasal drainage and nasal discharge is clear mostly and occasionally a little cloudy or light yellow.  She tested herself for COVID today and it is negative.  Her biggest concern is her breathing.  She does not feel short of breath, but her albuterol inhaler is just not working to control her wheezing.  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    Allergies: No Known Allergies Medications:  Current Outpatient Medications:    fluticasone (FLONASE) 50 MCG/ACT nasal spray, Place 2 sprays into both nostrils daily., Disp: 16 g, Rfl: 0   albuterol (VENTOLIN HFA) 108 (90 Base) MCG/ACT inhaler,  INHALE 1-2 PUFFS BY MOUTH EVERY 6 HOURS AS NEEDED FOR WHEEZE OR SHORTNESS OF BREATH, Disp: 8.5 each, Rfl: 1   DULoxetine (CYMBALTA) 30 MG capsule, Take 1 capsule (30 mg total) by mouth daily., Disp: 30 capsule, Rfl: 2   gabapentin (NEURONTIN) 100 MG capsule, Take 1 capsule (100 mg total) by mouth 3 (three) times daily., Disp: 90 capsule, Rfl: 3   Omeprazole 20 MG TBEC, Take 1 tablet (20 mg total) by mouth daily as needed (acid reflux)., Disp: 90 tablet, Rfl: 3   ondansetron (ZOFRAN) 8 MG tablet, Take 1 tablet (8 mg total) by mouth every 8 (eight) hours as needed for nausea or vomiting., Disp: 20 tablet, Rfl: 0   OZEMPIC, 0.25 OR 0.5 MG/DOSE, 2 MG/3ML SOPN, INJECT 0.5MG SUBCUTANEOUSLY ONCE A WEEK, Disp: 3 mL, Rfl: 0   predniSONE (DELTASONE) 20 MG tablet, Take 2 tablets (40 mg total) by mouth daily., Disp: 10 tablet, Rfl: 0   traZODone (DESYREL) 100 MG tablet, TAKE 1 TABLET BY MOUTH EVERYDAY AT BEDTIME, Disp: 90 tablet, Rfl: 1  Observations/Objective: Patient is well-developed, well-nourished in no acute distress.  Resting comfortably  at home.  Head is normocephalic, atraumatic.  No labored breathing.  Is able to speak in complete sentences.  No acute respiratory distress noted on video Speech is clear and coherent with logical content.  Patient is alert and oriented at baseline.  Patient sounds congested on video and is clearing her throat frequently   Assessment and Plan: 1. Viral upper respiratory tract infection  2. Asthma exacerbation, mild  Discussed options with patient.  We agreed to try a course of prednisone for her wheezing along with patient switching from her albuterol inhaler to her albuterol nebulizer, which she thinks nebulizer works better for her when she is having wheezing.  She has not used her nebulizer albuterol yet this illness.  I will prescribe Flonase.  Patient is to start using nasal saline at home.  We discussed saline spray versus saline irrigation.  I advised her  to start Mucinex and drink plenty of liquids.  If this treatment plan does not resolve her symptoms, patient will need to be seen in person.  And if her symptoms get worse in any way, she will need to be seen in person.  Follow Up Instructions: I discussed the assessment and treatment plan with the patient. The patient was provided an opportunity to ask questions and all were answered. The patient agreed with the plan and demonstrated an understanding of the instructions.  A copy of instructions were sent to the patient via MyChart unless otherwise noted below.   The patient was advised to call back or seek an in-person evaluation if the symptoms worsen or if the condition fails to improve as anticipated.  Time:  I spent 14 minutes with the patient via telehealth technology discussing the above problems/concerns.    Carvel Getting, NP

## 2022-02-16 NOTE — Patient Instructions (Addendum)
Rebecca Velasquez, thank you for joining Carvel Getting, NP for today's virtual visit.  While this provider is not your primary care provider (PCP), if your PCP is located in our provider database this encounter information will be shared with them immediately following your visit.  Consent: (Patient) Rebecca Velasquez provided verbal consent for this virtual visit at the beginning of the encounter.  Current Medications:  Current Outpatient Medications:    fluticasone (FLONASE) 50 MCG/ACT nasal spray, Place 2 sprays into both nostrils daily., Disp: 16 g, Rfl: 0   albuterol (VENTOLIN HFA) 108 (90 Base) MCG/ACT inhaler, INHALE 1-2 PUFFS BY MOUTH EVERY 6 HOURS AS NEEDED FOR WHEEZE OR SHORTNESS OF BREATH, Disp: 8.5 each, Rfl: 1   DULoxetine (CYMBALTA) 30 MG capsule, Take 1 capsule (30 mg total) by mouth daily., Disp: 30 capsule, Rfl: 2   gabapentin (NEURONTIN) 100 MG capsule, Take 1 capsule (100 mg total) by mouth 3 (three) times daily., Disp: 90 capsule, Rfl: 3   Omeprazole 20 MG TBEC, Take 1 tablet (20 mg total) by mouth daily as needed (acid reflux)., Disp: 90 tablet, Rfl: 3   ondansetron (ZOFRAN) 8 MG tablet, Take 1 tablet (8 mg total) by mouth every 8 (eight) hours as needed for nausea or vomiting., Disp: 20 tablet, Rfl: 0   OZEMPIC, 0.25 OR 0.5 MG/DOSE, 2 MG/3ML SOPN, INJECT 0.5MG SUBCUTANEOUSLY ONCE A WEEK, Disp: 3 mL, Rfl: 0   predniSONE (DELTASONE) 20 MG tablet, Take 2 tablets (40 mg total) by mouth daily., Disp: 10 tablet, Rfl: 0   traZODone (DESYREL) 100 MG tablet, TAKE 1 TABLET BY MOUTH EVERYDAY AT BEDTIME, Disp: 90 tablet, Rfl: 1   Medications ordered in this encounter:  Meds ordered this encounter  Medications   fluticasone (FLONASE) 50 MCG/ACT nasal spray    Sig: Place 2 sprays into both nostrils daily.    Dispense:  16 g    Refill:  0   predniSONE (DELTASONE) 20 MG tablet    Sig: Take 2 tablets (40 mg total) by mouth daily.    Dispense:  10 tablet    Refill:  0     *If you need  refills on other medications prior to your next appointment, please contact your pharmacy*  Follow-Up: Call back or seek an in-person evaluation if the symptoms worsen or if the condition fails to improve as anticipated.  Other Instructions Please switch to using your albuterol nebulizer instead of your inhaler every 4-6 hours as needed for wheezing or shortness of breath.  I have prescribed prednisone and Flonase for you.  Sometimes insurance companies do not cover Flonase since it is available over-the-counter; the pharmacy will let you know if it is covered by insurance or you will need to buy yourself.  Please start using your saline spray and use it several times a day, at different times than the Flonase.  Or try using saline irrigation, such as with a neti pot, several times a day while you are sick. Many neti pots come with salt packets premeasured to use to make saline. If you use your own salt, make sure it is kosher salt or sea salt (don't use table salt as it has iodine in it and you don't need that in your nose). Use distilled water to make saline. If you mix your own saline using your own salt, the recipe is 1/4 teaspoon salt in 1 cup warm water. Using saline irrigation can help prevent and treat sinus infections.   Start taking Mucinex (  generic guaifenesin is okay to use) and drink plenty of liquids to help thin out the congestion.  If this treatment plan does not help you or if your breathing gets worse, you will need to be seen in person such as at an urgent care or if symptoms are severe, at an emergency room.  Now is a great time to quit smoking!   If you have been instructed to have an in-person evaluation today at a local Urgent Care facility, please use the link below. It will take you to a list of all of our available [facility], including address, phone number and hours of operation. Please do not delay care.  [facility]  If you or a family  member do not have a primary care provider, use the link below to schedule a visit and establish care. When you choose a  primary care physician or advanced practice provider, you gain a long-term partner in health. Find a Primary Care Provider  Learn more about 's in-office and virtual care options: [facility]

## 2022-02-17 ENCOUNTER — Other Ambulatory Visit: Payer: Self-pay | Admitting: Family Medicine

## 2022-02-19 ENCOUNTER — Other Ambulatory Visit: Payer: Self-pay | Admitting: Family Medicine

## 2022-02-28 ENCOUNTER — Other Ambulatory Visit: Payer: Self-pay | Admitting: Family Medicine

## 2022-03-02 ENCOUNTER — Other Ambulatory Visit: Payer: Self-pay | Admitting: Family Medicine

## 2022-03-03 NOTE — Telephone Encounter (Signed)
Pharmacy comment: Alternative Requested:PLAN LIMITATIONS EXCEEDED. INSURANCE NOT PAYING ANYMORE.

## 2022-03-05 ENCOUNTER — Other Ambulatory Visit: Payer: Self-pay | Admitting: Family Medicine

## 2022-03-06 ENCOUNTER — Other Ambulatory Visit: Payer: Self-pay | Admitting: *Deleted

## 2022-03-06 MED ORDER — OZEMPIC (0.25 OR 0.5 MG/DOSE) 2 MG/3ML ~~LOC~~ SOPN: PEN_INJECTOR | SUBCUTANEOUS | 0 refills | Status: DC

## 2022-03-13 ENCOUNTER — Other Ambulatory Visit: Payer: Self-pay | Admitting: Otolaryngology

## 2022-03-13 DIAGNOSIS — J329 Chronic sinusitis, unspecified: Secondary | ICD-10-CM

## 2022-03-30 ENCOUNTER — Other Ambulatory Visit: Payer: Self-pay | Admitting: Family Medicine

## 2022-04-10 ENCOUNTER — Other Ambulatory Visit: Payer: Self-pay | Admitting: Family Medicine

## 2022-04-27 ENCOUNTER — Telehealth: Payer: Self-pay | Admitting: *Deleted

## 2022-04-27 NOTE — Telephone Encounter (Signed)
(  [facility]) Rx #: [id number] Ozempic (0.25 or 0.5 MG/DOSE) 2MG/3ML pen-injectors Waiting for determination

## 2022-04-29 ENCOUNTER — Ambulatory Visit: Payer: 59 | Admitting: Family Medicine

## 2022-04-29 VITALS — BP 114/77 | HR 69 | Temp 97.7°F | Ht 61.0 in | Wt 209.2 lb

## 2022-04-29 DIAGNOSIS — F330 Major depressive disorder, recurrent, mild: Secondary | ICD-10-CM

## 2022-04-29 DIAGNOSIS — F411 Generalized anxiety disorder: Secondary | ICD-10-CM

## 2022-04-29 DIAGNOSIS — L732 Hidradenitis suppurativa: Secondary | ICD-10-CM

## 2022-04-29 MED ORDER — DOXYCYCLINE HYCLATE 100 MG PO TABS: 100.0000 mg | ORAL_TABLET | Freq: Two times a day (BID) | ORAL | 0 refills | Status: DC

## 2022-04-29 MED ORDER — WEGOVY 0.5 MG/0.5ML ~~LOC~~ SOAJ: 0.5000 mg | SUBCUTANEOUS | 0 refills | Status: DC

## 2022-04-29 MED ORDER — VORTIOXETINE HBR 5 MG PO TABS: 5.0000 mg | ORAL_TABLET | Freq: Every day | ORAL | 0 refills | Status: DC

## 2022-04-29 MED ORDER — CLINDAMYCIN PHOSPHATE 1 % EX GEL: Freq: Two times a day (BID) | CUTANEOUS | 0 refills | Status: DC

## 2022-04-29 NOTE — Progress Notes (Signed)
   Rebecca Velasquez is a 36 y.o. female who presents today for an office visit.  Assessment/Plan:  Chronic Problems Addressed Today: Hidradenitis I&D performed on abscess in pubic area today.  See below procedure note.  She tolerated well.  We will also start course of doxycycline.  Start Clindagel refer to dermatology for ongoing maintenance.  Morbid obesity ([place]) She does not think insurance will pay for Ozempic any longer.  We will try Wegovy.  She will continue to work on diet and exercise.  Mild episode of recurrent major depressive disorder ([place]) Follows with psychiatry.  They are wanting to start her on Trintellix though there have been some issues with insurance coverage.  She request samples today.  We will give a 2-week supply of Trintellix 5 mg daily.  She will follow-up with psychiatry later today.  Generalized anxiety disorder See below.  She is currently seeing psychiatrist and they are trying to get her on Trintellix.  We will give a small supply of samples today while they are waiting on insurance approval for her Trintellix.  She we will continue her other medications per psychiatry.     Subjective:  HPI:  See A/p for status of chronic conditions.   Patient here with abscess in groin area.  This started about 5 days ago. Some drainage. No fevers or chills.  She has had similar lesions under her breast before.       Objective:  Physical Exam: BP 114/77   Pulse 69   Temp 97.7 F (36.5 C) (Temporal)   Ht 5' 1"  (1.549 m)   Wt 209 lb 3.2 oz (94.9 kg)   LMP 04/13/2022   SpO2 96%   BMI 39.53 kg/m   Gen: No acute distress, resting comfortably CV: Regular rate and rhythm with no murmurs appreciated Pulm: Normal work of breathing, clear to auscultation bilaterally with no crackles, wheezes, or rhonchi Skin: Large approximately 2 to 3 cm abscess on mons pubis. Chaperone present for exam Neuro: Grossly normal, moves all extremities Psych: Normal affect and thought  content  Incision and Drainage Procedure Note  Pre-operative Diagnosis: Abscess  Post-operative Diagnosis: normal  Indications: Therapeutic  Anesthesia: 1% lidocaine with epinephrine  Procedure Details  The procedure, risks and complications have been discussed in detail (including, but not limited to airway compromise, infection, bleeding) with the patient, and the patient has signed consent to the procedure.  The skin was sterilely prepped and draped over the affected area in the usual fashion. After adequate local anesthesia, I&D with a #11 blade was performed on the abscess described above purulent drainage: present The patient was observed until stable.  Findings: N/A  EBL: 2 cc's  Drains: N/A  Condition: Tolerated procedure well   Complications: none.        Algis Greenhouse. Rebecca Pain, MD 04/29/2022 11:35 AM

## 2022-04-29 NOTE — Assessment & Plan Note (Signed)
She does not think insurance will pay for Ozempic any longer.  We will try Wegovy.  She will continue to work on diet and exercise.

## 2022-04-29 NOTE — Patient Instructions (Addendum)
It was very nice to see you today!  We drained your abscess today.  You have hidradenitis.  I will refer you to dermatologist.  We will also start an antibiotic today to clear up any current outbreaks.  You can use the Clindagel twice daily to prevent outbreaks in the future.  We will give you a sample of Trintellix today as well.  Please come back to see me in 3 to 6 months or sooner if needed  Take care, Dr [name]  PLEASE NOTE:  If you had any lab tests please let us know if you have not heard back within a few days. You may see your results on mychart before we have a chance to review them but we will give you a call once they are reviewed by [place]. If we ordered any referrals today, please let us know if you have not heard from their office within the next week.   Please try these tips to maintain a healthy lifestyle:  Eat at least 3 REAL meals and 1-2 snacks per day.  Aim for no more than 5 hours between eating.  If you eat breakfast, please do so within one hour of getting up.   Each meal should contain half fruits/vegetables, one quarter protein, and one quarter carbs (no bigger than a computer mouse)  Cut down on sweet beverages. This includes juice, soda, and sweet tea.   Drink at least 1 glass of water with each meal and aim for at least 8 glasses per day  Exercise at least 150 minutes every week.

## 2022-04-29 NOTE — Assessment & Plan Note (Signed)
Follows with psychiatry.  They are wanting to start her on Trintellix though there have been some issues with insurance coverage.  She request samples today.  We will give a 2-week supply of Trintellix 5 mg daily.  She will follow-up with psychiatry later today.

## 2022-04-29 NOTE — Assessment & Plan Note (Signed)
See below.  She is currently seeing psychiatrist and they are trying to get her on Trintellix.  We will give a small supply of samples today while they are waiting on insurance approval for her Trintellix.  She we will continue her other medications per psychiatry.

## 2022-04-29 NOTE — Assessment & Plan Note (Signed)
I&D performed on abscess in pubic area today.  See below procedure note.  She tolerated well.  We will also start course of doxycycline.  Start Clindagel refer to dermatology for ongoing maintenance.

## 2022-04-30 ENCOUNTER — Other Ambulatory Visit: Payer: Self-pay | Admitting: Family Medicine

## 2022-05-07 NOTE — Telephone Encounter (Signed)
LVM Your PA request has been denied.  Please contact insurance for medication approve by insurance

## 2022-05-11 ENCOUNTER — Ambulatory Visit: Payer: 59 | Admitting: Physician Assistant

## 2022-05-11 ENCOUNTER — Telehealth: Payer: Self-pay | Admitting: Family Medicine

## 2022-05-11 NOTE — Telephone Encounter (Signed)
Pt states: -Endometriosis specialist needed -discussed at 08/02 appointment   Has Patient been seen by PCP for this complaint? yes   Reason: Endometriosis  Referral to which Specialty: Endometriosis specialist OB/GYN  Preferred office/ provider:   Pt states she is new to the area and requests guidance

## 2022-05-16 ENCOUNTER — Other Ambulatory Visit: Payer: Self-pay | Admitting: *Deleted

## 2022-05-16 DIAGNOSIS — N809 Endometriosis, unspecified: Secondary | ICD-10-CM

## 2022-05-16 NOTE — Telephone Encounter (Signed)
Referral placed.

## 2022-05-25 ENCOUNTER — Encounter: Payer: Self-pay | Admitting: Family Medicine

## 2022-05-25 ENCOUNTER — Ambulatory Visit: Payer: 59 | Admitting: Family Medicine

## 2022-05-25 VITALS — BP 110/66 | HR 64 | Temp 98.0°F | Ht 61.0 in | Wt 206.0 lb

## 2022-05-25 DIAGNOSIS — M5431 Sciatica, right side: Secondary | ICD-10-CM

## 2022-05-25 MED ORDER — TRAMADOL HCL 50 MG PO TABS: 50.0000 mg | ORAL_TABLET | Freq: Four times a day (QID) | ORAL | 0 refills | Status: AC | PRN

## 2022-05-25 MED ORDER — METHOCARBAMOL 750 MG PO TABS: 750.0000 mg | ORAL_TABLET | Freq: Four times a day (QID) | ORAL | 0 refills | Status: DC | PRN

## 2022-05-25 MED ORDER — PANTOPRAZOLE SODIUM 40 MG PO TBEC: 40.0000 mg | DELAYED_RELEASE_TABLET | Freq: Every day | ORAL | 0 refills | Status: DC

## 2022-05-25 MED ORDER — METHYLPREDNISOLONE ACETATE 80 MG/ML IJ SUSP
80.0000 mg | Freq: Once | INTRAMUSCULAR | Status: AC
  Administered 2022-05-25: 80 mg via INTRAMUSCULAR

## 2022-05-25 NOTE — Patient Instructions (Signed)
It was very nice to see you today!  Continue tylenol.  Stop advil.  Sent in robaxin and tramadol.  Do stretches.     PLEASE NOTE:  If you had any lab tests please let us know if you have not heard back within a few days. You may see your results on MyChart before we have a chance to review them but we will give you a call once they are reviewed by [place]. If we ordered any referrals today, please let us know if you have not heard from their office within the next week.   Please try these tips to maintain a healthy lifestyle:  Eat most of your calories during the day when you are active. Eliminate processed foods including packaged sweets (pies, cakes, cookies), reduce intake of potatoes, white bread, white pasta, and white rice. Look for whole grain options, oat flour or almond flour.  Each meal should contain half fruits/vegetables, one quarter protein, and one quarter carbs (no bigger than a computer mouse).  Cut down on sweet beverages. This includes juice, soda, and sweet tea. Also watch fruit intake, though this is a healthier sweet option, it still contains natural sugar! Limit to 3 servings daily.  Drink at least 1 glass of water with each meal and aim for at least 8 glasses per day  Exercise at least 150 minutes every week.

## 2022-05-25 NOTE — Progress Notes (Signed)
Subjective:     Patient ID: Rebecca Velasquez, female    DOB: [birth year], 36 y.o.   MRN: [id number]  Chief Complaint  Patient presents with   sciatica pain    Pt c/o right side sciatica pain, started 5 days ago. She has been taking Advil for pain.    HPI-here w/sister H/o sciatica.  Intermitt.   [facility] does massage.  Taking advil  works as a Camera operator. No specific injury.   Having bad restless legs in general and that have ppt this event.  Flared 5 days ago. Going down R leg and can "shoot thru" abd. Down to back of knee and up as well.  Occ numbness R lateral thigh if standing a long time. No weakness.  No b/b dysfunction  no saddle anesthesia.   Tylenol as well.  No heat/ice-"sensory issues".   Tramadol in past and sometimes get steroids.    Health Maintenance Due  Topic Date Due   COVID-19 Vaccine (1) Never done   PAP SMEAR-Modifier  Never done   INFLUENZA VACCINE  04/28/2022    Past Medical History:  Diagnosis Date   Asthma    Celiac disease    Depression    Endometriosis     Past Surgical History:  Procedure Laterality Date   GASTRIC BYPASS OPEN  2016   IVC FILTER INSERTION  2016    Outpatient Medications Prior to Visit  Medication Sig Dispense Refill   albuterol (VENTOLIN HFA) 108 (90 Base) MCG/ACT inhaler INHALE 1-2 PUFFS BY MOUTH EVERY 6 HOURS AS NEEDED FOR WHEEZE OR SHORTNESS OF BREATH 8.5 each 1   clindamycin (CLINDAGEL) 1 % gel Apply topically 2 (two) times daily. 30 g 0   fluticasone (FLONASE) 50 MCG/ACT nasal spray Place 2 sprays into both nostrils daily. 16 g 0   gabapentin (NEURONTIN) 100 MG capsule TAKE 1 CAPSULE (100 MG TOTAL) BY MOUTH THREE TIMES DAILY. 90 capsule 3   LORazepam (ATIVAN) 0.5 MG tablet Take 0.5 mg by mouth daily as needed.     traZODone (DESYREL) 100 MG tablet TAKE 1 TABLET BY MOUTH EVERYDAY AT BEDTIME 90 tablet 1   vortioxetine HBr (TRINTELLIX) 5 MG TABS tablet Take 1 tablet (5 mg total) by mouth daily. 14 tablet 0   OZEMPIC, 0.25 OR 0.5  MG/DOSE, 2 MG/3ML SOPN INJECT 0.5MG SUBCUTANEOUSLY ONCE A WEEK (Patient not taking: Reported on 05/25/2022) 3 mL 0   doxycycline (VIBRA-TABS) 100 MG tablet Take 1 tablet (100 mg total) by mouth 2 (two) times daily. 14 tablet 0   DULoxetine (CYMBALTA) 30 MG capsule TAKE 1 CAPSULE BY MOUTH EVERY DAY 90 capsule 1   ondansetron (ZOFRAN) 8 MG tablet Take 1 tablet (8 mg total) by mouth every 8 (eight) hours as needed for nausea or vomiting. 20 tablet 0   pantoprazole (PROTONIX) 40 MG tablet Take 1 tablet (40 mg total) by mouth daily. (Patient not taking: Reported on 05/25/2022) 90 tablet 0   Semaglutide-Weight Management (WEGOVY) 0.5 MG/0.5ML SOAJ Inject 0.5 mg into the skin once a week. 2 mL 0   No facility-administered medications prior to visit.    No Known Allergies ROS neg/noncontributory except as noted HPI/below Skin infections freq-under breasts, groin, pannus.        Objective:     BP 110/66 (BP Location: Left Arm, Patient Position: Sitting, Cuff Size: Large)   Pulse 64   Temp 98 F (36.7 C) (Temporal)   Ht 5' 1"  (1.549 m)   Wt 206 lb (93.4  kg)   LMP 05/04/2022 (Approximate)   SpO2 95%   BMI 38.92 kg/m  Wt Readings from Last 3 Encounters:  05/25/22 206 lb (93.4 kg)  04/29/22 209 lb 3.2 oz (94.9 kg)  01/28/22 221 lb 4 oz (100.4 kg)    Physical Exam   Gen: WDWN NAD WF HEENT: NCAT, conjunctiva not injected, sclera nonicteric EXT:  no edema MSK: no gross abnormalities.  NEURO: A&O x3.  CN II-XII intact.  PSYCH: normal mood. Good eye contact Back:  can stand on heels/toes/1 leg.  No TTP.some R SI tenderness. No rash.  MS 5/5 BLE.  DTR 2+ BLE.  SLR neg B.  Good ROM      Assessment & Plan:   Problem List Items Addressed This Visit   None Visit Diagnoses     Sciatica of right side    -  Primary   Relevant Medications   LORazepam (ATIVAN) 0.5 MG tablet   methylPREDNISolone acetate (DEPO-MEDROL) injection 80 mg (Completed)   methocarbamol (ROBAXIN-750) 750 MG tablet       Sciatica R-not supposed to take nsaids d/t gastric bypass.  Methylprednisonone 47m injection given in office.  Methocarbamol [id number] prn and cont tylenol and add tramadol 509m 6hrs prn.  Cont stretches.  Co-pay too high for PT-do home exercises.       Meds ordered this encounter  Medications   pantoprazole (PROTONIX) 40 MG tablet    Sig: Take 1 tablet (40 mg total) by mouth daily.    Dispense:  90 tablet    Refill:  0   methylPREDNISolone acetate (DEPO-MEDROL) injection 80 mg   traMADol (ULTRAM) 50 MG tablet    Sig: Take 1 tablet (50 mg total) by mouth every 6 (six) hours as needed for up to 5 days.    Dispense:  20 tablet    Refill:  0   methocarbamol (ROBAXIN-750) 750 MG tablet    Sig: Take 1 tablet (750 mg total) by mouth every 6 (six) hours as needed for muscle spasms.    Dispense:  20 tablet    Refill:  0    [place]

## 2022-05-26 ENCOUNTER — Other Ambulatory Visit: Payer: Self-pay | Admitting: Family Medicine

## 2022-05-27 NOTE — Telephone Encounter (Signed)
Alternative Requested:PRIOR AUTHORIZATION NEEDED. MAX OF 90 IN 365 DAYS

## 2022-05-28 ENCOUNTER — Other Ambulatory Visit: Payer: Self-pay | Admitting: Family Medicine

## 2022-05-29 ENCOUNTER — Other Ambulatory Visit: Payer: Self-pay

## 2022-05-29 ENCOUNTER — Emergency Department (HOSPITAL_COMMUNITY): Payer: 59

## 2022-05-29 ENCOUNTER — Encounter (HOSPITAL_COMMUNITY): Payer: Self-pay

## 2022-05-29 ENCOUNTER — Emergency Department (HOSPITAL_COMMUNITY)
Admission: EM | Admit: 2022-05-29 | Discharge: 2022-05-30 | Disposition: A | Discharge status: Home / Self Care | Payer: 59 | Attending: Emergency Medicine | Admitting: Emergency Medicine

## 2022-05-29 DIAGNOSIS — N200 Calculus of kidney: Secondary | ICD-10-CM | POA: Diagnosis not present

## 2022-05-29 DIAGNOSIS — M545 Low back pain, unspecified: Secondary | ICD-10-CM | POA: Diagnosis present

## 2022-05-29 LAB — CBC WITH DIFFERENTIAL/PLATELET
Abs Immature Granulocytes: 0.01 10*3/uL (ref 0.00–0.07)
Basophils Absolute: 0 10*3/uL (ref 0.0–0.1)
Basophils Relative: 0 %
Eosinophils Absolute: 0.3 10*3/uL (ref 0.0–0.5)
Eosinophils Relative: 3 %
HCT: 37.2 % (ref 36.0–46.0)
Hemoglobin: 12 g/dL (ref 12.0–15.0)
Immature Granulocytes: 0 %
Lymphocytes Relative: 43 %
Lymphs Abs: 4.2 10*3/uL — ABNORMAL HIGH (ref 0.7–4.0)
MCH: 29.7 pg (ref 26.0–34.0)
MCHC: 32.3 g/dL (ref 30.0–36.0)
MCV: 92.1 fL (ref 80.0–100.0)
Monocytes Absolute: 0.6 10*3/uL (ref 0.1–1.0)
Monocytes Relative: 6 %
Neutro Abs: 4.6 10*3/uL (ref 1.7–7.7)
Neutrophils Relative %: 48 %
Platelets: 264 10*3/uL (ref 150–400)
RBC: 4.04 MIL/uL (ref 3.87–5.11)
RDW: 12.9 % (ref 11.5–15.5)
WBC: 9.8 10*3/uL (ref 4.0–10.5)
nRBC: 0 % (ref 0.0–0.2)

## 2022-05-29 LAB — COMPREHENSIVE METABOLIC PANEL
ALT: 15 U/L (ref 0–44)
AST: 17 U/L (ref 15–41)
Albumin: 4.1 g/dL (ref 3.5–5.0)
Alkaline Phosphatase: 74 U/L (ref 38–126)
Anion gap: 7 (ref 5–15)
BUN: 14 mg/dL (ref 6–20)
CO2: 24 mmol/L (ref 22–32)
Calcium: 9.3 mg/dL (ref 8.9–10.3)
Chloride: 107 mmol/L (ref 98–111)
Creatinine, Ser: 0.76 mg/dL (ref 0.44–1.00)
GFR, Estimated: >60 mL/min (ref >60)
Glucose, Bld: 87 mg/dL (ref 70–99)
Potassium: 3.8 mmol/L (ref 3.5–5.1)
Sodium: 138 mmol/L (ref 135–145)
Total Bilirubin: 0.4 mg/dL (ref 0.3–1.2)
Total Protein: 7.4 g/dL (ref 6.5–8.1)

## 2022-05-29 LAB — URINALYSIS, ROUTINE W REFLEX MICROSCOPIC
Bilirubin Urine: NEGATIVE
Glucose, UA: NEGATIVE mg/dL
Hgb urine dipstick: NEGATIVE
Ketones, ur: 5 mg/dL — AB
Nitrite: NEGATIVE
Protein, ur: 30 mg/dL — AB
Specific Gravity, Urine: 1.029 (ref 1.005–1.030)
pH: 5 (ref 5.0–8.0)

## 2022-05-29 LAB — LIPASE, BLOOD: Lipase: 22 U/L (ref 11–51)

## 2022-05-29 LAB — PREGNANCY, URINE: Preg Test, Ur: NEGATIVE

## 2022-05-29 MED ORDER — FENTANYL CITRATE PF 50 MCG/ML IJ SOSY
50.0000 ug | PREFILLED_SYRINGE | Freq: Once | INTRAMUSCULAR | Status: AC
  Administered 2022-05-29: 50 ug via INTRAVENOUS
  Filled 2022-05-29: qty 1

## 2022-05-29 MED ORDER — ONDANSETRON HCL 4 MG/2ML IJ SOLN
4.0000 mg | Freq: Once | INTRAMUSCULAR | Status: AC
  Administered 2022-05-29: 4 mg via INTRAVENOUS
  Filled 2022-05-29: qty 2

## 2022-05-29 MED ORDER — SODIUM CHLORIDE 0.9 % IV BOLUS
500.0000 mL | Freq: Once | INTRAVENOUS | Status: AC
  Administered 2022-05-29: 500 mL via INTRAVENOUS

## 2022-05-29 NOTE — ED Triage Notes (Signed)
Ambulatory to ED with c/o back and side pain x 5 days. Seen at PCP for same 4 days ago.

## 2022-05-29 NOTE — ED Provider Triage Note (Signed)
Emergency Medicine Provider Triage Evaluation Note  Rebecca Velasquez , a 35 y.o. female  was evaluated in triage.  Pt complains of left low back pain, left-sided abdominal pain.  Ongoing for about 5 days.  Seen PCP and was given a shot of steroids, muscle relaxer, and tramadol without improvement.  States the pain got worse yesterday.  Denies fever, chills, dysuria, vaginal discharge.  Review of Systems  Positive: As above Negative: As above  Physical Exam  BP 131/87   Pulse 76   Temp 98.2 F (36.8 C)   Resp 19   Ht 5' 1"  (1.549 m)   Wt 93.4 kg   LMP 05/04/2022 (Approximate)   SpO2 100%   BMI 38.92 kg/m  Gen:   Awake, no distress   Resp:  Normal effort  MSK:   Moves extremities without difficulty  Other:  With out spinal process or lumbar paraspinal muscle tenderness.  Medical Decision Making  Medically screening exam initiated at 9:47 PM.  Appropriate orders placed.  Rebecca Velasquez was informed that the remainder of the evaluation will be completed by another provider, this initial triage assessment does not replace that evaluation, and the importance of remaining in the ED until their evaluation is complete.     Evlyn Courier, PA-C 05/29/22 2148

## 2022-05-29 NOTE — ED Provider Notes (Incomplete)
[facility] DEPT Provider Note   CSN: [id number] Arrival date & time: 05/29/22  2110     History {Add pertinent medical, surgical, social history, OB history to HPI:1} Chief Complaint  Patient presents with   Back Pain    Rebecca Velasquez is a 36 y.o. female who presents with concern for low back pain that radiates into the abdomen and down into the groin.  She states that the pain is constant and achy but has intermittent episodes of very sharp exacerbations of the pain.  No associated nausea vomiting or diarrhea.  Patient has history of sciatica for which she was recently seen her PCP and administer dose of Solu-Medrol and discharged with Robaxin which she states did not help.  Has been taking Advil at home with minimal improvement her pain, Tylenol tried once without improvement.  She denies any numbness, tingling in her genitals or perineum, weakness in her lower extremities.  I personally reviewed this patient's medical records which is history of generalized anxiety disorder, asthma, GERD, status post gastric bypass surgery, and hidradenitis suppurativa.  Also with history of celiac disease.  She is not anticoagulated. LMP one week ago  HPI     Home Medications Prior to Admission medications   Medication Sig Start Date End Date Taking? Authorizing Provider  albuterol (VENTOLIN HFA) 108 (90 Base) MCG/ACT inhaler INHALE 1-2 PUFFS BY MOUTH EVERY 6 HOURS AS NEEDED FOR WHEEZE OR SHORTNESS OF BREATH 03/02/22   Vivi Barrack, MD  clindamycin (CLINDAGEL) 1 % gel APPLY TOPICALLY TWICE A DAY 05/29/22   Vivi Barrack, MD  fluticasone [facility]) 50 MCG/ACT nasal spray Place 2 sprays into both nostrils daily. 02/16/22   Carvel Getting, NP  gabapentin (NEURONTIN) 100 MG capsule TAKE 1 CAPSULE (100 MG TOTAL) BY MOUTH THREE TIMES DAILY. 03/30/22   Vivi Barrack, MD  lansoprazole (PREVACID) 30 MG capsule Take 1 capsule (30 mg total) by mouth daily at 12 noon. 05/27/22    Vivi Barrack, MD  LORazepam (ATIVAN) 0.5 MG tablet Take 0.5 mg by mouth daily as needed. 03/27/22   [provider]  methocarbamol (ROBAXIN-750) 750 MG tablet Take 1 tablet (750 mg total) by mouth every 6 (six) hours as needed for muscle spasms. 05/25/22   Tawnya Crook, MD  OZEMPIC, 0.25 OR 0.5 MG/DOSE, 2 MG/3ML SOPN INJECT 0.5MG SUBCUTANEOUSLY ONCE A WEEK Patient not taking: Reported on 05/25/2022 04/10/22   Vivi Barrack, MD  traMADol (ULTRAM) 50 MG tablet Take 1 tablet (50 mg total) by mouth every 6 (six) hours as needed for up to 5 days. 05/25/22 05/30/22  Tawnya Crook, MD  traZODone (DESYREL) 100 MG tablet TAKE 1 TABLET BY MOUTH EVERYDAY AT BEDTIME 01/26/22   Vivi Barrack, MD  vortioxetine HBr (TRINTELLIX) 5 MG TABS tablet Take 1 tablet (5 mg total) by mouth daily. 04/29/22   Vivi Barrack, MD  QUEtiapine (SEROQUEL) 200 MG tablet Take 200 mg by mouth at bedtime.  05/05/19  [provider]      Allergies    Patient has no known allergies.    Review of Systems   Review of Systems  Constitutional: Negative.  Negative for appetite change.  HENT: Negative.    Respiratory: Negative.    Cardiovascular: Negative.   Gastrointestinal:  Positive for abdominal pain. Negative for diarrhea, nausea and vomiting.  Genitourinary:  Positive for decreased urine volume and frequency. Negative for difficulty urinating, dysuria, hematuria, menstrual problem, vaginal bleeding,  vaginal discharge and vaginal pain.  Musculoskeletal: Negative.   Neurological: Negative.     Physical Exam Updated Vital Signs BP 131/87   Pulse 76   Temp 98.2 F (36.8 C)   Resp 19   Ht 5' 1"  (1.549 m)   Wt 93.4 kg   LMP 05/04/2022 (Approximate)   SpO2 100%   BMI 38.92 kg/m  Physical Exam Vitals and nursing note reviewed.  Constitutional:      Appearance: She is obese. She is not ill-appearing or toxic-appearing.  HENT:     Head: Normocephalic and atraumatic.     Nose: Nose normal.      Mouth/Throat:     Mouth: Mucous membranes are moist.     Pharynx: No oropharyngeal exudate or posterior oropharyngeal erythema.  Eyes:     General:        Right eye: No discharge.        Left eye: No discharge.     Extraocular Movements: Extraocular movements intact.     Conjunctiva/sclera: Conjunctivae normal.     Pupils: Pupils are equal, round, and reactive to light.  Cardiovascular:     Rate and Rhythm: Normal rate and regular rhythm.     Pulses: Normal pulses.     Heart sounds: Normal heart sounds. No murmur heard. Pulmonary:     Effort: Pulmonary effort is normal. No respiratory distress.     Breath sounds: Normal breath sounds. No wheezing or rales.  Abdominal:     General: Bowel sounds are normal. There is no distension.     Palpations: Abdomen is soft.     Tenderness: There is abdominal tenderness in the suprapubic area. There is right CVA tenderness. There is no left CVA tenderness, guarding or rebound.  Musculoskeletal:        General: No deformity.     Cervical back: Neck supple.  Skin:    General: Skin is warm and dry.  Neurological:     Mental Status: She is alert. Mental status is at baseline.  Psychiatric:        Mood and Affect: Mood normal.     ED Results / Procedures / Treatments   Labs (all labs ordered are listed, but only abnormal results are displayed) Labs Reviewed  CBC WITH DIFFERENTIAL/PLATELET  COMPREHENSIVE METABOLIC PANEL  URINALYSIS, ROUTINE W REFLEX MICROSCOPIC  LIPASE, BLOOD    EKG None  Radiology No results found.  Procedures Procedures  {Document cardiac monitor, telemetry assessment procedure when appropriate:1}  Medications Ordered in ED Medications - No data to display  ED Course/ Medical Decision Making/ A&P                           Medical Decision Making 36 year old female presents with concern for right-sided flank pain rating to the abdomen x5 days.  History of sciatica.  Vital signs are normal and intake.   Cardiopulmonary exam is normal, abdominal exam as above with right-sided CVAT mild suprapubic tenderness.  Patient is tearful.  Ambulatory without difficulty, no saddle anesthesia.  The differential diagnosis of emergent flank pain includes, but is not limited to: Nephrolithiasis/ Renal Colic, Pyelonephritis, Abdominal aortic aneurysm, Aortic dissection, Renal artery embolism, Renal vein thrombosis, Renal infarction, Renal hemorrhage, Mesenteric ischemia, Bladder tumor, Cystitis, Biliary colic, Pancreatitis, Perforated peptic ulcer,  Appendicitis, Inguinal Hernia, Diverticulitis, Bowel obstruction. Shingles, Lower lobe pneumonia, Retroperitoneal hematoma/abscess/tumor, Epidural abscess, Epidural hematoma.  Particularly in females it is important to consider (Ectopic Pregnancy,PID/TOA,Ovarian cyst, Ovarian  torsion, STD)      ***  {Document critical care time when appropriate:1} {Document review of labs and clinical decision tools ie heart score, Chads2Vasc2 etc:1}  {Document your independent review of radiology images, and any outside records:1} {Document your discussion with family members, caretakers, and with consultants:1} {Document social determinants of health affecting pt's care:1} {Document your decision making why or why not admission, treatments were needed:1} Final Clinical Impression(s) / ED Diagnoses Final diagnoses:  None    Rx / DC Orders ED Discharge Orders     None

## 2022-05-30 ENCOUNTER — Emergency Department (HOSPITAL_COMMUNITY): Payer: 59

## 2022-05-30 MED ORDER — TAMSULOSIN HCL 0.4 MG PO CAPS: 0.4000 mg | ORAL_CAPSULE | Freq: Every day | ORAL | 0 refills | Status: DC

## 2022-05-30 MED ORDER — MORPHINE SULFATE (PF) 4 MG/ML IV SOLN
4.0000 mg | Freq: Once | INTRAVENOUS | Status: AC
  Administered 2022-05-30: 4 mg via INTRAVENOUS
  Filled 2022-05-30: qty 1

## 2022-05-30 MED ORDER — KETOROLAC TROMETHAMINE 15 MG/ML IJ SOLN
15.0000 mg | Freq: Once | INTRAMUSCULAR | Status: AC
  Administered 2022-05-30: 15 mg via INTRAVENOUS
  Filled 2022-05-30: qty 1

## 2022-05-30 MED ORDER — OXYCODONE-ACETAMINOPHEN 5-325 MG PO TABS: 1.0000 | ORAL_TABLET | Freq: Four times a day (QID) | ORAL | 0 refills | Status: DC | PRN

## 2022-05-30 NOTE — Discharge Instructions (Addendum)
You are seen in the ER today for your flank pain.  Suspect you may have been passing some small kidney stones causing your pain.  You can start a medication to help with this for the next week and have been prescribed a short course of pain medication as well.  Follow-up with your primary care doctor or with the urologist listed below as needed.  Return to the ER with any severe symptoms.

## 2022-05-30 NOTE — ED Notes (Signed)
Pt able to tolerate crackers and water for PO challenge.

## 2022-06-03 ENCOUNTER — Encounter: Payer: Self-pay | Admitting: Family Medicine

## 2022-06-03 ENCOUNTER — Other Ambulatory Visit: Payer: Self-pay | Admitting: Family Medicine

## 2022-06-03 ENCOUNTER — Ambulatory Visit: Payer: 59 | Admitting: Family Medicine

## 2022-06-03 VITALS — BP 119/79 | HR 68 | Temp 97.9°F | Ht 61.0 in | Wt 206.2 lb

## 2022-06-03 DIAGNOSIS — N200 Calculus of kidney: Secondary | ICD-10-CM | POA: Diagnosis not present

## 2022-06-03 DIAGNOSIS — K219 Gastro-esophageal reflux disease without esophagitis: Secondary | ICD-10-CM | POA: Diagnosis not present

## 2022-06-03 MED ORDER — OMEPRAZOLE 40 MG PO CPDR: 40.0000 mg | DELAYED_RELEASE_CAPSULE | Freq: Every day | ORAL | 3 refills | Status: DC

## 2022-06-03 MED ORDER — OXYCODONE-ACETAMINOPHEN 5-325 MG PO TABS: 1.0000 | ORAL_TABLET | Freq: Three times a day (TID) | ORAL | 0 refills | Status: DC | PRN

## 2022-06-03 MED ORDER — TAMSULOSIN HCL 0.4 MG PO CAPS
0.4000 mg | ORAL_CAPSULE | Freq: Every day | ORAL | 0 refills | Status: DC
Start: 2022-06-03 — End: 2022-06-30

## 2022-06-03 MED ORDER — KETOROLAC TROMETHAMINE 60 MG/2ML IM SOLN
60.0000 mg | Freq: Once | INTRAMUSCULAR | Status: AC
  Administered 2022-06-03: 60 mg via INTRAMUSCULAR

## 2022-06-03 NOTE — Assessment & Plan Note (Signed)
Insurance will not pay for Protonix.  We will refill her omeprazole.

## 2022-06-03 NOTE — Progress Notes (Signed)
   Rebecca Velasquez is a 36 y.o. female who presents today for an office visit.  Assessment/Plan:  New/Acute Problems: Nephrolithiasis No red flags.  Found to have a 3 mm stone on CT scan in the ED which should pass without any other aggressive intervention.  We will refill her Flomax and refill her oxycodone.  We will give 60 mg IM Toradol today as she is still in a fair amount of Velasquez.  Encouraged good hydration.  Also recommended supplementation with potassium citrate given that she has calcium oxalate stones.  We will place referral to urology.  We discussed reasons to return to care and seek emergent care.  Follow-up as needed.  Chronic Problems Addressed Today: GERD (gastroesophageal reflux disease) Insurance will not pay for Protonix.  We will refill her omeprazole.     Subjective:  HPI:  Patient here for ED follow up.  Went to the ED 5 days ago low back Velasquez rating to the abdomen and into the groin.  She was previously seen in this office by different provider for similar symptoms concerning for sciatica flare.  She was given Depo-Medrol and Robaxin without any improvement.  She has also tried Advil and Tylenol without much improvement.  UA showed calcium oxalate crystals.  She had a CT scan which showed nonobstructive right nephrolithiasis 3 mm.  She was given IV Toradol and morphine with significant improvement in symptoms.  She was discharged home with Flomax and oxycodone.  Over the last few days, her Velasquez has persisted. She has been compliant with Flomax.  She does feel like the Velasquez is mid lower anterior abdomen and she feels like the kidney stones are slowly working her way through.  She has some mild nausea which is controllable.  She has never had kidney stones in the past.  She does admit she does not stay up on her fluid intake as she should.       Objective:  Physical Exam: BP 119/79   Pulse 68   Temp 97.9 F (36.6 C) (Temporal)   Ht 5' 1"  (1.549 m)   Wt 206 lb 3.2 oz  (93.5 kg)   LMP 05/04/2022 (Approximate)   SpO2 98%   BMI 38.96 kg/m   Gen: No acute distress, resting comfortably CV: Regular rate and rhythm with no murmurs appreciated Pulm: Normal work of breathing, clear to auscultation bilaterally with no crackles, wheezes, or rhonchi Neuro: Grossly normal, moves all extremities Psych: Normal affect and thought content  Time Spent: 45 minutes of total time was spent on the date of the encounter performing the following actions: chart review prior to seeing the patient including recent ED visit, obtaining history, performing a medically necessary exam, counseling on the treatment plan, placing orders, and documenting in our EHR.        Algis Greenhouse. Rebecca Pain, MD 06/03/2022 8:21 AM

## 2022-06-03 NOTE — Telephone Encounter (Signed)
Alternative Requested:PLAN LIMITATIONS EXCEEDED. INSURANCE NOT PAYING ANYMORE.

## 2022-06-03 NOTE — Patient Instructions (Signed)
It was very nice to see you today!  Please continue with the Flomax.  I will refill your pain medications  We will give you 60 mg of Toradol today to help you with the pain.  Please make sure that you are getting plenty of fluids.  You can try taking a potassium citrate supplement to prevent kidney stones in the future.  I will place a referral for you to see the urologist if your symptoms or not improving in the next couple of weeks.  Take care, Dr [name]  PLEASE NOTE:  If you had any lab tests please let us know if you have not heard back within a few days. You may see your results on mychart before we have a chance to review them but we will give you a call once they are reviewed by [place]. If we ordered any referrals today, please let us know if you have not heard from their office within the next week.   Please try these tips to maintain a healthy lifestyle:  Eat at least 3 REAL meals and 1-2 snacks per day.  Aim for no more than 5 hours between eating.  If you eat breakfast, please do so within one hour of getting up.   Each meal should contain half fruits/vegetables, one quarter protein, and one quarter carbs (no bigger than a computer mouse)  Cut down on sweet beverages. This includes juice, soda, and sweet tea.   Drink at least 1 glass of water with each meal and aim for at least 8 glasses per day  Exercise at least 150 minutes every week.

## 2022-06-06 ENCOUNTER — Other Ambulatory Visit: Payer: Self-pay | Admitting: Family Medicine

## 2022-06-08 MED ORDER — OXYCODONE-ACETAMINOPHEN 5-325 MG PO TABS: 1.0000 | ORAL_TABLET | Freq: Three times a day (TID) | ORAL | 0 refills | Status: AC | PRN

## 2022-06-09 ENCOUNTER — Telehealth: Payer: Self-pay | Admitting: *Deleted

## 2022-06-09 NOTE — Telephone Encounter (Signed)
Rebecca Velasquez - Rx #: [id number] Status Sent to [facility] Drug oxyCODONE-Acetaminophen 5-325MG tablets Waiting for determination

## 2022-06-12 ENCOUNTER — Telehealth: Payer: Self-pay | Admitting: *Deleted

## 2022-06-12 NOTE — Telephone Encounter (Signed)
KeyBrooke Velasquez - Rx #: [id number] Drug Esomeprazole Magnesium 40MG packets Status Sent to [facility] Waiting for determination

## 2022-06-19 NOTE — Telephone Encounter (Signed)
Deniedon September 12 Your PA request has been denied

## 2022-06-25 NOTE — Telephone Encounter (Signed)
PA approved form 06/23/2022 till 06/24/2023  Pharmacy notified

## 2022-06-26 ENCOUNTER — Other Ambulatory Visit: Payer: Self-pay | Admitting: Family Medicine

## 2022-06-30 ENCOUNTER — Other Ambulatory Visit: Payer: Self-pay | Admitting: Family Medicine

## 2022-07-13 ENCOUNTER — Ambulatory Visit (HOSPITAL_COMMUNITY)
Admission: EM | Admit: 2022-07-13 | Discharge: 2022-07-13 | Disposition: A | Discharge status: Home / Self Care | Payer: 59 | Attending: Nurse Practitioner | Admitting: Nurse Practitioner

## 2022-07-13 ENCOUNTER — Encounter (HOSPITAL_COMMUNITY): Payer: Self-pay

## 2022-07-13 DIAGNOSIS — Z1152 Encounter for screening for COVID-19: Secondary | ICD-10-CM | POA: Insufficient documentation

## 2022-07-13 DIAGNOSIS — J0100 Acute maxillary sinusitis, unspecified: Secondary | ICD-10-CM

## 2022-07-13 LAB — RESP PANEL BY RT-PCR (RSV, FLU A&B, COVID)  RVPGX2
Influenza A by PCR: NEGATIVE
Influenza B by PCR: NEGATIVE
Resp Syncytial Virus by PCR: NEGATIVE
SARS Coronavirus 2 by RT PCR: NEGATIVE

## 2022-07-13 LAB — POCT RAPID STREP A, ED / UC: Streptococcus, Group A Screen (Direct): NEGATIVE

## 2022-07-13 MED ORDER — PREDNISONE 10 MG (21) PO TBPK: ORAL_TABLET | ORAL | 0 refills | Status: DC

## 2022-07-13 NOTE — Discharge Instructions (Addendum)
Your COVID, Influenza and Strep Test are pending You may have an Upper Respiratory Infection Prednisone 10 mg Dosepak as directed for 6 days We encourage conservative treatment with symptom relief. Recommend Mucinex D as directed for sinus congestion We encourage you to use Tylenol alternating with Ibuprofen for your fever if not contraindicated. (Remember to use as directed do not exceed daily dosing recommendations) We also encourage salt water gargles for your sore throat. You should also consider throat lozenges and chloraseptic spray.  Your cough can be soothed with a cough suppressant.

## 2022-07-13 NOTE — ED Triage Notes (Signed)
Pt is here for sinus pressure in the face x5 days causing discomfort

## 2022-07-13 NOTE — ED Provider Notes (Signed)
[name]    CSN: [id number] Arrival date & time: 07/13/22  1914      History   Chief Complaint Chief Complaint  Patient presents with   Facial Pain    HPI Rebecca Velasquez is a 36 y.o. female.   HPI  She is complaining of sinus pressurenasal congestion and cough,. She notices wheezing in the morning. This has been going on for 5 days. She denies fever, chills,  sneezing, runny nose, new sore throat, new loss of smell or taste, shortness of breath, chest pain, nausea, or diarrhea. Denies exposure positive/negative. COVID/Influenza/Strep. The current treatment has been OTC sudafed.  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      Home Medications    Prior to Admission medications   Medication Sig Start Date End Date Taking? Authorizing Provider  predniSONE (STERAPRED UNI-PAK 21 TAB) 10 MG (21) TBPK tablet Take 6 tabs by mouth daily for 1 day, then taper down by 10 mg daily until completed 07/13/22  Yes Vevelyn Francois, NP  albuterol (VENTOLIN HFA) 108 (90 Base) MCG/ACT inhaler INHALE 1-2 PUFFS BY MOUTH EVERY 6 HOURS AS NEEDED FOR WHEEZE OR SHORTNESS OF BREATH 03/02/22   Vivi Barrack, MD  esomeprazole (NEXIUM) 40 MG packet Take 40 mg by mouth daily before breakfast. 06/04/22   Vivi Barrack, MD  fluticasone (FLONASE) 50 MCG/ACT nasal  spray Place 2 sprays into both nostrils daily. 02/16/22   Carvel Getting, NP  gabapentin (NEURONTIN) 100 MG capsule TAKE 1 CAPSULE (100 MG TOTAL) BY MOUTH THREE TIMES DAILY. 03/30/22   Vivi Barrack, MD  LORazepam (ATIVAN) 0.5 MG tablet Take 0.5 mg by mouth daily as needed. 03/27/22   [provider]  tamsulosin (FLOMAX) 0.4 MG CAPS capsule TAKE 1 CAPSULE BY MOUTH EVERY DAY 06/30/22   Vivi Barrack, MD  traZODone (DESYREL) 100 MG tablet TAKE 1 TABLET BY MOUTH EVERYDAY AT BEDTIME 01/26/22   Vivi Barrack, MD  vortioxetine HBr (TRINTELLIX) 5 MG TABS tablet Take 1 tablet (5 mg total) by mouth daily. 04/29/22   Vivi Barrack, MD  QUEtiapine (SEROQUEL) 200 MG tablet Take 200 mg by mouth at bedtime.  05/05/19  [provider]    Family History Family History  Problem Relation Age of Onset   Rheum arthritis Mother    Cancer Mother    Diabetes Father    Cancer Father     Social History Social History   Tobacco Use   Smoking status: Every Day    Packs/day: 0.50    Types: Cigarettes   Smokeless tobacco: Never   Tobacco comments:    Pt interested in quitting smoking  Vaping Use   Vaping Use: Never used  Substance Use Topics   Alcohol use: Never  Drug use: Yes    Types: Marijuana     Allergies   Patient has no known allergies.   Review of Systems Review of Systems   Physical Exam Triage Vital Signs ED Triage Vitals  Enc Vitals Group     BP 07/13/22 2005 119/83     Pulse Rate 07/13/22 2005 60     Resp 07/13/22 2005 12     Temp 07/13/22 2005 97.7 F (36.5 C)     Temp Source 07/13/22 2005 Oral     SpO2 07/13/22 2005 98 %     Weight --      Height --      Head Circumference --      Peak Flow --      Pain Score 07/13/22 2002 7     Pain Loc --      Pain Edu? --      Excl. in [place]? --    No data found.  Updated Vital Signs BP 119/83 (BP Location: Left Arm)   Pulse 60   Temp 97.7 F (36.5 C) (Oral)   Resp 12   LMP 07/06/2022   SpO2 98%   Visual  Acuity Right Eye Distance:   Left Eye Distance:   Bilateral Distance:    Right Eye Near:   Left Eye Near:    Bilateral Near:     Physical Exam Constitutional:      Appearance: She is obese.  HENT:     Head: Normocephalic.     Right Ear: Tympanic membrane normal.     Left Ear: Tympanic membrane normal.     Nose:     Right Sinus: Maxillary sinus tenderness and frontal sinus tenderness present.     Left Sinus: Maxillary sinus tenderness and frontal sinus tenderness present.     Mouth/Throat:     Mouth: Mucous membranes are moist.  Eyes:     General:        Right eye: No discharge.        Left eye: No discharge.     Pupils: Pupils are equal, round, and reactive to light.  Cardiovascular:     Rate and Rhythm: Normal rate.  Pulmonary:     Effort: Pulmonary effort is normal.  Musculoskeletal:     Cervical back: Normal range of motion.  Skin:    General: Skin is warm.     Capillary Refill: Capillary refill takes less than 2 seconds.  Neurological:     General: No focal deficit present.     Mental Status: She is alert and oriented to person, place, and time.  Psychiatric:        Mood and Affect: Mood normal.      UC Treatments / Results  Labs (all labs ordered are listed, but only abnormal results are displayed) Labs Reviewed  RESP PANEL BY RT-PCR (RSV, FLU A&B, COVID)  [id number]  POCT RAPID STREP A, ED / UC    EKG   Radiology No results found.  Procedures Procedures (including critical care time)  Medications Ordered in UC Medications - No data to display  Initial Impression / Assessment and Plan / UC Course  I have reviewed the triage vital signs and the nursing notes.  Pertinent labs & imaging results that were available during my care of the patient were reviewed by me and considered in my medical decision making (see chart for details).    Sinus pressure Final Clinical Impressions(s) / UC Diagnoses   Final diagnoses:  Acute  maxillary sinusitis,  recurrence not specified     Discharge Instructions      Your COVID, Influenza and Strep Test are pending You may have an Upper Respiratory Infection Prednisone 10 mg Dosepak as directed for 6 days We encourage conservative treatment with symptom relief. Recommend Mucinex D as directed for sinus congestion We encourage you to use Tylenol alternating with Ibuprofen for your fever if not contraindicated. (Remember to use as directed do not exceed daily dosing recommendations) We also encourage salt water gargles for your sore throat. You should also consider throat lozenges and chloraseptic spray.  Your cough can be soothed with a cough suppressant.     ED Prescriptions     Medication Sig Dispense Auth. Provider   predniSONE (STERAPRED UNI-PAK 21 TAB) 10 MG (21) TBPK tablet Take 6 tabs by mouth daily for 1 day, then taper down by 10 mg daily until completed 21 each Vevelyn Francois, NP      PDMP not reviewed this encounter.   Dionisio David [place], [place] 07/13/22 [phone]

## 2022-08-03 ENCOUNTER — Ambulatory Visit: Payer: 59 | Admitting: Family Medicine

## 2022-08-03 VITALS — BP 108/73 | HR 63 | Temp 97.8°F | Ht 61.0 in | Wt 189.6 lb

## 2022-08-03 DIAGNOSIS — F411 Generalized anxiety disorder: Secondary | ICD-10-CM | POA: Diagnosis not present

## 2022-08-03 DIAGNOSIS — J4520 Mild intermittent asthma, uncomplicated: Secondary | ICD-10-CM | POA: Diagnosis not present

## 2022-08-03 MED ORDER — FLUCONAZOLE 150 MG PO TABS: 150.0000 mg | ORAL_TABLET | ORAL | 0 refills | Status: DC | PRN

## 2022-08-03 MED ORDER — TRAZODONE HCL 100 MG PO TABS: ORAL_TABLET | ORAL | 1 refills | Status: DC

## 2022-08-03 MED ORDER — PREDNISONE 50 MG PO TABS: ORAL_TABLET | ORAL | 0 refills | Status: DC

## 2022-08-03 MED ORDER — ALBUTEROL SULFATE (2.5 MG/3ML) 0.083% IN NEBU: 2.5000 mg | INHALATION_SOLUTION | Freq: Four times a day (QID) | RESPIRATORY_TRACT | 1 refills | Status: DC | PRN

## 2022-08-03 MED ORDER — AMOXICILLIN-POT CLAVULANATE 875-125 MG PO TABS: 1.0000 | ORAL_TABLET | Freq: Two times a day (BID) | ORAL | 0 refills | Status: DC

## 2022-08-03 NOTE — Progress Notes (Signed)
   Rebecca Velasquez is a 36 y.o. female who presents today for an office visit.  Assessment/Plan:  New/Acute Problems: Cough Consistent with asthma flare.  No red flags or signs of respiratory distress.  May also have some underlying sinusitis as well.  We will be treating as below with Augmentin and prednisone.  Chronic Problems Addressed Today: Asthma Acute flare today.  We will start prednisone.  We will refill her albuterol nebulizer.  Encouraged hydration.  We discussed reasons to return to care.  Generalized anxiety disorder We will refill her trazodone today.  She follows with psychiatry.     Subjective:  HPI:  PAtient here with cough and congestion. This has been going on for a few weeks. Started with nasal congestion and sneezing. She has noticed more wheezing the last week ago. Some shortness of breath   She was seen at urgent care a few weeks ago and given prednisone without much improvement. Some fever initially but this has subsideded. OTc medication have not given any benefit.       Objective:  Physical Exam: BP 108/73   Pulse 63   Temp 97.8 F (36.6 C) (Temporal)   Ht 5' 1"  (1.549 m)   Wt 189 lb 9.6 oz (86 kg)   LMP 07/06/2022   SpO2 98%   BMI 35.82 kg/m   Gen: No acute distress, resting comfortably HEENT: TMs with clear effusion.  OP erythematous.  Nasal mucosa erythematous and boggy. CV: Regular rate and rhythm with no murmurs appreciated Pulm: Normal work of breathing, and expiratory wheezes with coarse rhonchi noted throughout all lung fields. Neuro: Grossly normal, moves all extremities Psych: Normal affect and thought content      Merri Dimaano M. [name], MD 08/03/2022 10:54 AM

## 2022-08-03 NOTE — Assessment & Plan Note (Signed)
Acute flare today.  We will start prednisone.  We will refill her albuterol nebulizer.  Encouraged hydration.  We discussed reasons to return to care.

## 2022-08-03 NOTE — Assessment & Plan Note (Signed)
We will refill her trazodone today.  She follows with psychiatry.

## 2022-08-03 NOTE — Patient Instructions (Signed)
It was very nice to see you today!  Please start the prednisone and Augmentin.  I will send in a refill on the nebulizer.  Make sure that you are getting plenty of fluids.  Please let us know if not improving in the next 5-7 ays.   Take care, Dr [name]  PLEASE NOTE:  If you had any lab tests please let us know if you have not heard back within a few days. You may see your results on mychart before we have a chance to review them but we will give you a call once they are reviewed by [place]. If we ordered any referrals today, please let us know if you have not heard from their office within the next week.   Please try these tips to maintain a healthy lifestyle:  Eat at least 3 REAL meals and 1-2 snacks per day.  Aim for no more than 5 hours between eating.  If you eat breakfast, please do so within one hour of getting up.   Each meal should contain half fruits/vegetables, one quarter protein, and one quarter carbs (no bigger than a computer mouse)  Cut down on sweet beverages. This includes juice, soda, and sweet tea.   Drink at least 1 glass of water with each meal and aim for at least 8 glasses per day  Exercise at least 150 minutes every week.

## 2022-08-05 ENCOUNTER — Encounter: Payer: Self-pay | Admitting: Family Medicine

## 2022-08-06 ENCOUNTER — Other Ambulatory Visit: Payer: Self-pay | Admitting: *Deleted

## 2022-08-06 DIAGNOSIS — L732 Hidradenitis suppurativa: Secondary | ICD-10-CM

## 2022-08-07 ENCOUNTER — Encounter: Payer: Self-pay | Admitting: Family Medicine

## 2022-08-07 DIAGNOSIS — R059 Cough, unspecified: Secondary | ICD-10-CM

## 2022-08-10 NOTE — Telephone Encounter (Signed)
Please advise 

## 2022-08-11 NOTE — Telephone Encounter (Signed)
Ok to send in tessalon 248m bid prn cough.  CAlgis Greenhouse PJerline Pain MD 08/11/2022 12:35 PM

## 2022-08-13 MED ORDER — BENZONATATE 200 MG PO CAPS: 200.0000 mg | ORAL_CAPSULE | Freq: Two times a day (BID) | ORAL | 0 refills | Status: DC | PRN

## 2022-10-07 ENCOUNTER — Other Ambulatory Visit: Payer: Self-pay

## 2022-10-07 ENCOUNTER — Inpatient Hospital Stay (HOSPITAL_COMMUNITY)
Admission: AD | Admit: 2022-10-07 | Discharge: 2022-10-12 | DRG: 885 | Disposition: A | Discharge status: Home / Self Care | Payer: 59 | Source: Other Acute Inpatient Hospital | Attending: Psychiatry | Admitting: Psychiatry

## 2022-10-07 ENCOUNTER — Encounter (HOSPITAL_COMMUNITY): Payer: Self-pay

## 2022-10-07 ENCOUNTER — Encounter (HOSPITAL_COMMUNITY): Payer: Self-pay | Admitting: Nurse Practitioner

## 2022-10-07 ENCOUNTER — Emergency Department (HOSPITAL_COMMUNITY)
Admission: EM | Admit: 2022-10-07 | Discharge: 2022-10-07 | Disposition: A | Discharge status: Other Acute Inpatient Hospital | Payer: 59 | Source: Home / Self Care | Attending: Emergency Medicine | Admitting: Emergency Medicine

## 2022-10-07 DIAGNOSIS — F17210 Nicotine dependence, cigarettes, uncomplicated: Secondary | ICD-10-CM | POA: Diagnosis present

## 2022-10-07 DIAGNOSIS — Z79899 Other long term (current) drug therapy: Secondary | ICD-10-CM | POA: Diagnosis not present

## 2022-10-07 DIAGNOSIS — R45851 Suicidal ideations: Secondary | ICD-10-CM | POA: Diagnosis present

## 2022-10-07 DIAGNOSIS — Z20822 Contact with and (suspected) exposure to covid-19: Secondary | ICD-10-CM | POA: Diagnosis present

## 2022-10-07 DIAGNOSIS — F332 Major depressive disorder, recurrent severe without psychotic features: Principal | ICD-10-CM | POA: Diagnosis present

## 2022-10-07 DIAGNOSIS — Z1152 Encounter for screening for COVID-19: Secondary | ICD-10-CM | POA: Insufficient documentation

## 2022-10-07 DIAGNOSIS — K219 Gastro-esophageal reflux disease without esophagitis: Secondary | ICD-10-CM | POA: Diagnosis present

## 2022-10-07 DIAGNOSIS — F411 Generalized anxiety disorder: Secondary | ICD-10-CM | POA: Diagnosis present

## 2022-10-07 DIAGNOSIS — Z9884 Bariatric surgery status: Secondary | ICD-10-CM | POA: Diagnosis not present

## 2022-10-07 DIAGNOSIS — K900 Celiac disease: Secondary | ICD-10-CM | POA: Diagnosis present

## 2022-10-07 DIAGNOSIS — G4700 Insomnia, unspecified: Secondary | ICD-10-CM | POA: Diagnosis present

## 2022-10-07 DIAGNOSIS — F1120 Opioid dependence, uncomplicated: Secondary | ICD-10-CM | POA: Diagnosis present

## 2022-10-07 DIAGNOSIS — Z634 Disappearance and death of family member: Secondary | ICD-10-CM

## 2022-10-07 DIAGNOSIS — Z818 Family history of other mental and behavioral disorders: Secondary | ICD-10-CM

## 2022-10-07 LAB — CBC
HCT: 36.9 % (ref 36.0–46.0)
Hemoglobin: 12.2 g/dL (ref 12.0–15.0)
MCH: 30.5 pg (ref 26.0–34.0)
MCHC: 33.1 g/dL (ref 30.0–36.0)
MCV: 92.3 fL (ref 80.0–100.0)
Platelets: 278 10*3/uL (ref 150–400)
RBC: 4 MIL/uL (ref 3.87–5.11)
RDW: 12.5 % (ref 11.5–15.5)
WBC: 9.1 10*3/uL (ref 4.0–10.5)
nRBC: 0 % (ref 0.0–0.2)

## 2022-10-07 LAB — COMPREHENSIVE METABOLIC PANEL
ALT: 12 U/L (ref 0–44)
AST: 14 U/L — ABNORMAL LOW (ref 15–41)
Albumin: 3.7 g/dL (ref 3.5–5.0)
Alkaline Phosphatase: 65 U/L (ref 38–126)
Anion gap: 8 (ref 5–15)
BUN: 10 mg/dL (ref 6–20)
CO2: 22 mmol/L (ref 22–32)
Calcium: 8.8 mg/dL — ABNORMAL LOW (ref 8.9–10.3)
Chloride: 106 mmol/L (ref 98–111)
Creatinine, Ser: 0.65 mg/dL (ref 0.44–1.00)
GFR, Estimated: >60 mL/min (ref >60)
Glucose, Bld: 93 mg/dL (ref 70–99)
Potassium: 3.6 mmol/L (ref 3.5–5.1)
Sodium: 136 mmol/L (ref 135–145)
Total Bilirubin: 0.3 mg/dL (ref 0.3–1.2)
Total Protein: 7 g/dL (ref 6.5–8.1)

## 2022-10-07 LAB — SALICYLATE LEVEL: Salicylate Lvl: <7 mg/dL — ABNORMAL LOW (ref 7.0–30.0)

## 2022-10-07 LAB — ACETAMINOPHEN LEVEL: Acetaminophen (Tylenol), Serum: <10 ug/mL — ABNORMAL LOW (ref 10–30)

## 2022-10-07 LAB — RESP PANEL BY RT-PCR (RSV, FLU A&B, COVID)  RVPGX2
Influenza A by PCR: NEGATIVE
Influenza B by PCR: NEGATIVE
Resp Syncytial Virus by PCR: NEGATIVE
SARS Coronavirus 2 by RT PCR: NEGATIVE

## 2022-10-07 LAB — RAPID URINE DRUG SCREEN, HOSP PERFORMED
Amphetamines: NOT DETECTED
Barbiturates: NOT DETECTED
Benzodiazepines: NOT DETECTED
Cocaine: NOT DETECTED
Opiates: NOT DETECTED
Tetrahydrocannabinol: POSITIVE — AB

## 2022-10-07 LAB — ETHANOL: Alcohol, Ethyl (B): <10 mg/dL (ref <10)

## 2022-10-07 LAB — HCG, QUANTITATIVE, PREGNANCY: hCG, Beta Chain, Quant, S: <1 m[IU]/mL (ref <5)

## 2022-10-07 MED ORDER — TRAZODONE HCL 50 MG PO TABS: 50.0000 mg | ORAL_TABLET | Freq: Every evening | ORAL | Status: DC | PRN

## 2022-10-07 MED ORDER — LORAZEPAM 0.5 MG PO TABS
0.5000 mg | ORAL_TABLET | Freq: Four times a day (QID) | ORAL | Status: DC | PRN
  Administered 2022-10-07 – 2022-10-09 (×4): 0.5 mg via ORAL
  Filled 2022-10-07 (×4): qty 1

## 2022-10-07 MED ORDER — ESOMEPRAZOLE MAGNESIUM 40 MG PO PACK: 40.0000 mg | PACK | Freq: Every day | ORAL | Status: DC

## 2022-10-07 MED ORDER — ALUM & MAG HYDROXIDE-SIMETH 200-200-20 MG/5ML PO SUSP: 30.0000 mL | ORAL | Status: DC | PRN

## 2022-10-07 MED ORDER — GABAPENTIN 300 MG PO CAPS
300.0000 mg | ORAL_CAPSULE | Freq: Three times a day (TID) | ORAL | Status: DC
  Administered 2022-10-07 – 2022-10-08 (×3): 300 mg via ORAL
  Filled 2022-10-07 (×9): qty 1

## 2022-10-07 MED ORDER — LORAZEPAM 1 MG PO TABS
1.0000 mg | ORAL_TABLET | Freq: Once | ORAL | Status: AC
  Administered 2022-10-07: 1 mg via ORAL
  Filled 2022-10-07: qty 1

## 2022-10-07 MED ORDER — MAGNESIUM HYDROXIDE 400 MG/5ML PO SUSP
30.0000 mL | Freq: Every day | ORAL | Status: DC | PRN
  Administered 2022-10-09: 30 mL via ORAL
  Filled 2022-10-07: qty 30

## 2022-10-07 MED ORDER — HYDROXYZINE HCL 25 MG PO TABS
25.0000 mg | ORAL_TABLET | Freq: Four times a day (QID) | ORAL | Status: DC | PRN
  Administered 2022-10-07 – 2022-10-11 (×8): 25 mg via ORAL
  Filled 2022-10-07 (×8): qty 1

## 2022-10-07 MED ORDER — ACETAMINOPHEN 325 MG PO TABS
650.0000 mg | ORAL_TABLET | Freq: Four times a day (QID) | ORAL | Status: DC | PRN
  Administered 2022-10-08 – 2022-10-12 (×8): 650 mg via ORAL
  Filled 2022-10-07 (×9): qty 2

## 2022-10-07 MED ORDER — ALBUTEROL SULFATE HFA 108 (90 BASE) MCG/ACT IN AERS: 2.0000 | INHALATION_SPRAY | Freq: Four times a day (QID) | RESPIRATORY_TRACT | Status: DC | PRN

## 2022-10-07 MED ORDER — PANTOPRAZOLE SODIUM 40 MG PO TBEC
80.0000 mg | DELAYED_RELEASE_TABLET | Freq: Every day | ORAL | Status: DC
  Administered 2022-10-07 – 2022-10-12 (×6): 80 mg via ORAL
  Filled 2022-10-07 (×9): qty 2

## 2022-10-07 MED ORDER — NICOTINE 21 MG/24HR TD PT24
21.0000 mg | MEDICATED_PATCH | Freq: Every day | TRANSDERMAL | Status: DC
  Administered 2022-10-08 – 2022-10-12 (×5): 21 mg via TRANSDERMAL
  Filled 2022-10-07 (×8): qty 1

## 2022-10-07 MED ORDER — ALBUTEROL SULFATE (2.5 MG/3ML) 0.083% IN NEBU: 2.5000 mg | INHALATION_SOLUTION | Freq: Four times a day (QID) | RESPIRATORY_TRACT | Status: DC | PRN

## 2022-10-07 NOTE — ED Notes (Signed)
Sheriff notified of transport request

## 2022-10-07 NOTE — Progress Notes (Signed)
After orienting to the unit pt made a phone call that triggered her to become very upset. Pt began to scream and slam things in room. Pt seen visibly upset, crying. Pt given PRN 0.5mg  Ativan, PRN 25mg  Hydroxyzine, and scheduled 300mg  Gabapentin. Pt returned to room following medication administration to lay in bed.

## 2022-10-07 NOTE — ED Notes (Signed)
Notified pt sister [name] that pt will be transferred to [facility]. Pt requested to speak to sister, became very agitated within one minute of phone call and ended it.

## 2022-10-07 NOTE — Tx Team (Signed)
Initial Treatment Plan 10/07/2022 1:33 PM [name] TIR:[id number]    PATIENT STRESSORS: Financial difficulties   Marital or family conflict   Occupational concerns     PATIENT STRENGTHS: Average or above average intelligence  General fund of knowledge  Supportive family/friends    PATIENT IDENTIFIED PROBLEMS:   Suicidal Ideation    Anxiety/ Depression    Family Conflict (Boyfriend and Sister)    Sports administrator Stress       DISCHARGE CRITERIA:  Improved stabilization in mood, thinking, and/or behavior Safe-care adequate arrangements made Verbal commitment to aftercare and medication compliance  PRELIMINARY DISCHARGE PLAN: Outpatient therapy Return to previous living arrangement  PATIENT/FAMILY INVOLVEMENT: This treatment plan has been presented to and reviewed with the patient, Rebecca Velasquez. The patient has been given the opportunity to ask questions and make suggestions.  Lurline Del Cenia Zaragosa, RN 10/07/2022, 1:33 PM

## 2022-10-07 NOTE — ED Provider Notes (Signed)
[facility] Emergency Department Provider Note MRN:  [id number]  Arrival date & time: 10/07/22     Chief Complaint   Suicidal   History of Present Illness   Rebecca Velasquez is a 37 y.o. year-old female presents to the ED with chief complaint of suicidal thoughts and depression.  States that she doesn't want to live.  States that she wanted to overdose of pills.  Also reports to cutting herself.  BIB sister, who is concerned that patient is going to kill herself.  Recent change in antidepressant.  History provided by patient.   Review of Systems  Pertinent positive and negative review of systems noted in HPI.    Physical Exam   Vitals:   10/07/22 0101  BP: 128/80  Pulse: 74  Resp: 15  Temp: 98.2 F (36.8 C)  SpO2: 95%    CONSTITUTIONAL:  tearful-appearing, NAD NEURO:  Alert and oriented x 3, CN 3-12 grossly intact EYES:  eyes equal and reactive ENT/NECK:  Supple, no stridor  CARDIO:  appears well-perfused  PULM:  No respiratory distress,  GI/GU:  non-distended,  MSK/SPINE:  No gross deformities, no edema, moves all extremities  SKIN:  no rash, atraumatic   *Additional and/or pertinent findings included in MDM below  Diagnostic and Interventional Summary    EKG Interpretation  Date/Time:    Ventricular Rate:    PR Interval:    QRS Duration:   QT Interval:    QTC Calculation:   R Axis:     Text Interpretation:         Labs Reviewed  COMPREHENSIVE METABOLIC PANEL - Abnormal; Notable for the following components:      Result Value   Calcium 8.8 (*)    AST 14 (*)    All other components within normal limits  SALICYLATE LEVEL - Abnormal; Notable for the following components:   Salicylate Lvl <6.3 (*)    All other components within normal limits  ACETAMINOPHEN LEVEL - Abnormal; Notable for the following components:   Acetaminophen (Tylenol), Serum <10 (*)    All other components within normal limits  RAPID URINE DRUG SCREEN, HOSP  PERFORMED - Abnormal; Notable for the following components:   Tetrahydrocannabinol POSITIVE (*)    All other components within normal limits  RESP PANEL BY RT-PCR (RSV, FLU A&B, COVID)  RVPGX2  ETHANOL  CBC  HCG, QUANTITATIVE, PREGNANCY    No orders to display    Medications - No data to display   Procedures  /  Critical Care Procedures  ED Course and Medical Decision Making  I have reviewed the triage vital signs, the nursing notes, and pertinent available records from the EMR.  Social Determinants Affecting Complexity of Care: Patient has no clinically significant social determinants affecting this chief complaint..   ED Course:    Medical Decision Making Patient here with suicidal thoughts.  Planned to overdose with pills.  Also cut her wrist, but this is shallow and doesn't need repair.      Amount and/or Complexity of Data Reviewed Labs: ordered.    Details: No significant lab abnormalities     Consultants: TTS consulted.  Recommendation is for inpatient treatment.   Treatment and Plan: Inpatient treatment recommended.    Final Clinical Impressions(s) / ED Diagnoses     ICD-10-CM   1. Suicidal thoughts  R45.851       ED Discharge Orders     None         Discharge Instructions Discussed  with and Provided to Patient:   Discharge Instructions   None      Montine Circle, PA-C 10/07/22 0233    Shanon Rosser, MD 10/07/22 [phone]

## 2022-10-07 NOTE — BH Assessment (Signed)
Comprehensive Clinical Assessment (CCA) Note  10/07/2022 Rebecca Velasquez [id number]  Disposition: Clinical review given to Sindy Guadeloupe, NP who recommends inpatient psychiatric admission. [facility] can accept, pending discharges and faxing of IVC documentation. RN Darnelle Catalan Motsinger and Roxy Horseman, PA-C aware of the recommendation.  The patient demonstrates the following risk factors for suicide: Chronic risk factors for suicide include: psychiatric disorder of MDD . Acute risk factors for suicide include:  work stressors . Protective factors for this patient include: positive social support and positive therapeutic relationship. Considering these factors, the overall suicide risk at this point appears to be high. Patient is not appropriate for outpatient follow up.  Rebecca Velasquez is a 37 y.o. female who presents voluntarily to [facility] ED, accompanied by her sister Rebecca Velasquez [phone] and boyfriend. Patient reports a history of anxiety and depression. Patient states she was having a bad day today, crying a lot, as well as having trouble at work which has all compounded in the last few weeks. Patient states she has been having passive thoughts of SI with no plan. Patient reports "I wish I could, but I don't have the guts, I hate myself." Patient acknowledges having passive SI for quite some time, stating "sometimes I wish I could have it all go away." Patient initially denies engaging in any suicidal actions tonight, however when asked if she took pills as reported in her chart, patient reports that in a moment of hysteria she took a "bunch" of her Klonopin pills and then spit them out. Patient minimizes the events and states "I just wanted my boyfriend to see how much I'm in pain." Patient acknowledges symptoms including tearfulness, decreased energy, and hopelessness. Patient expresses she feels she does not have the mental capacity for things at times. Patient also endorses anxiousness when it's  time for bed. Patient discloses a history of self-harm by cutting when she was younger. She states she has not cut in 2 to 3 years. Patient denies any HI, auditory or visual hallucinations. Patient denies any substance use in the last 24 hours, however reports using marijuana at times. Patient UDS positive for marijuana. Patient denies having access to guns or weapons.   Patient identifies her primary stressors as work, as well as the relationships with her sister and boyfriend. Patient is a Research scientist (medical) and states due to recent perfectionism, she has been falling behind. Patient says the two days she has off each week,she has to try to split time between her sister and boyfriend, which is stressful. Patient states she never gets time to herself. Patient lives with her boyfriend and identifies him, as well as her sister as supportive. Pt denies any history of abuse. Patient denies any current legal involvement.   Patient has been with [facility] for a year, receiving mental health services. Patient states she is currently receiving bi-weekly therapy with Darral Dash. Patient also has a psychiatrist with the practice, Glenetta Hew. Patient has an appointment with her psychiatrist on Monday to switch from her Trintellix medication to Caplyta. Patient is also prescribed Klonopin 2x a day. Patient says her sister is diagnosed with bipolar and finds Caplyta to be helpful. Patient reports her psychiatrist suggested that she may have ADHD, during the appointment, however that is still being explored.  Patient is dressed in hospital scrubs, alert and oriented x4 with normal speech. Patient is tearful throughout the assessment and has a flat affect. Patient makes good eye contact and her thought process is coherent. There  is no indication patient is responding to internal stimuli. Patient was cooperative throughout the assessment. Patient states she would be safe going home and that she just needs to  sleep.   Patient provided permission to contact her sister Rebecca Velasquez for collateral. Rebecca Velasquez reports patient has been having a hard time the past couple of weeks. She says patient worked 55 hours this week and has barely had any sleep. Rebecca Velasquez shares they have lost their parents and brother by overdose over the past few years and patient has some PTSD related to the trauma. Rebecca Velasquez states patient cut herself on her neck, chest and wrist tonight. Patient tried to leave the home and take her boyfriends car. Rebecca Velasquez says when patient took her Klonopin pills, her boyfriend had to remove them from her mouth. Patient was supposed to be checked on by EMS, after her psychiatrist called, however Rebecca Velasquez says they never showed up. Rebecca Velasquez contacted police and states patient became very upset. Rebecca Velasquez does not believe patient is safe from self-harm at this time.  Chief Complaint:  Chief Complaint  Patient presents with   Suicidal   Visit Diagnosis:  MDD, recurrent severe without psychotic features    CCA Screening, Triage and Referral (STR)  Patient Reported Information How did you hear about [place]? Family/Friend  What Is the Reason for Your Visit/Call Today? Patient reports "I was having a bad day, crying a lot and having trouble at work. I guess it all compounded making my boyfriend and sister bring me here."  How Long Has This Been Causing You Problems? 1 wk - 1 month  What Do You Feel Would Help You the Most Today? Treatment for Depression or other mood problem   Have You Recently Had Any Thoughts About Hurting Yourself? Yes  Are You Planning to Commit Suicide/Harm Yourself At This time? No   Flowsheet Row ED from 10/07/2022 in [place] DEPT ED from 07/13/2022 in [facility] ED from 10/31/2021 in [facility] High Risk No Risk No Risk       Have you Recently Had Thoughts About [place]? No  Are You  Planning to Harm Someone at This Time? No  Explanation: N/A   Have You Used Any Alcohol or Drugs in the Past 24 Hours? No  What Did You Use and How Much? N/A   Do You Currently Have a Therapist/Psychiatrist? Yes  Name of Therapist/Psychiatrist: Name of Therapist/Psychiatrist: Psychiatrist Angelica Pou and therapist Sherlynn Stalls   Have You Been Recently Discharged From Any Office Practice or Programs? No  Explanation of Discharge From Practice/Program: N/A     CCA Screening Triage Referral Assessment Type of Contact: Tele-Assessment  Telemedicine Service Delivery: Telemedicine service delivery: This service was provided via telemedicine using a 2-way, interactive audio and video technology  Is this Initial or Reassessment? Is this Initial or Reassessment?: Initial Assessment  Date Telepsych consult ordered in [facility]:  Date Telepsych consult ordered in [facility]: 10/07/22  Time Telepsych consult ordered in [facility]:  Time Telepsych consult ordered in [facility]: 0123  Location of Assessment: [facility] ED  Provider Location: [facility] Assessment Services   Collateral Involvement: Aryanah Enslow (sister0 [phone]   Does Patient Have a Court Appointed Legal Guardian? No  Legal Guardian Contact Information: N/A  Copy of Legal Guardianship Form: -- (N/A)  Legal Guardian Notified of Arrival: -- (N/A)  Legal Guardian Notified of Pending Discharge: -- (N/A)  If Minor and  Not Living with Parent(s), Who has Custody? N/A  Is CPS involved or ever been involved? Never  Is APS involved or ever been involved? Never   Patient Determined To Be At Risk for Harm To Self or Others Based on Review of Patient Reported Information or Presenting Complaint? Yes, for Self-Harm  Method: No Plan  Availability of Means: No access or NA  Intent: Vague intent or NA  Notification Required: No need or identified person  Additional Information for Danger to Others Potential: -- (N/A)  Additional Comments for Danger  to Others Potential: N/A, no danger to others  Are There Guns or Other Weapons in Your Home? No  Types of Guns/Weapons: N/A  Are These Weapons Safely Secured?                            -- (N/A)  Who Could Verify You Are Able To Have These Secured: N/A  Do You Have any Outstanding Charges, Pending Court Dates, Parole/Probation? None  Contacted To Inform of Risk of Harm To Self or Others: Family/Significant Other: (Sister aware of risk of harm to self)    Does Patient Present under Involuntary Commitment? No    [place] of Residence: [place]   Patient Currently Receiving the Following Services: Individual Therapy; Medication Management   Determination of Need: Emergent (2 hours)   Options For Referral: Inpatient Hospitalization     CCA Biopsychosocial Patient Reported Schizophrenia/Schizoaffective Diagnosis in Past: No   Strengths: Patient states she is extremely helpful   Mental Health Symptoms Depression:  Increase/decrease in appetite; Sleep (too much or little); Hopelessness; Tearfulness; Change in energy/activity; Difficulty Concentrating   Duration of Depressive symptoms: Duration of Depressive Symptoms: Greater than two weeks   Mania:  None   Anxiety:   Sleep   Psychosis:  None   Duration of Psychotic symptoms:    Trauma:  None   Obsessions:  None   Compulsions:  None   Inattention:  None   Hyperactivity/Impulsivity:  No data recorded  Oppositional/Defiant Behaviors:  None   Emotional Irregularity:  None   Other Mood/Personality Symptoms:  N/A    Mental Status Exam Appearance and self-care  Stature:  Small   Weight:  Average weight   Clothing:  -- (Hospital scrubs)   Grooming:  Normal   Cosmetic use:  None   Posture/gait:  Normal   Motor activity:  Not Remarkable   Sensorium  Attention:  Normal   Concentration:  Normal   Orientation:  X5   Recall/memory:  Normal   Affect and Mood  Affect:  Anxious; Depressed; Flat    Mood:  Depressed   Relating  Eye contact:  Normal   Facial expression:  Depressed; Sad   Attitude toward examiner:  Cooperative   Thought and Language  Speech flow: Normal   Thought content:  Appropriate to Mood and Circumstances   Preoccupation:  None   Hallucinations:  None   Organization:  Linear   Company secretary of Knowledge:  Good   Intelligence:  Average   Abstraction:  Normal   Judgement:  Fair   Dance movement psychotherapist:  Realistic   Insight:  Good   Decision Making:  Impulsive   Social Functioning  Social Maturity:  Isolates; Responsible   Social Judgement:  Normal   Stress  Stressors:  Work   Coping Ability:  Overwhelmed; Exhausted   Skill Deficits:  None   Supports:  Family  Religion: Religion/Spirituality Are You A Religious Person?: No How Might This Affect Treatment?: N/A  Leisure/Recreation: Leisure / Recreation Do You Have Hobbies?: Yes Leisure and Hobbies: Art, music, movies and reading  Exercise/Diet: Exercise/Diet Do You Exercise?: No Have You Gained or Lost A Significant Amount of Weight in the Past Six Months?: No Do You Follow a Special Diet?: No Do You Have Any Trouble Sleeping?: Yes Explanation of Sleeping Difficulties: Patient reports having a hard time falling asleep   CCA Employment/Education Employment/Work Situation: Employment / Work Situation Employment Situation: Employed Work Stressors: Patient reports falling behind at work Patient's Job has Been Impacted by Current Illness: No Has Patient ever Been in Passenger transport manager?: No  Education: Education Is Patient Currently Attending School?: No Last Grade Completed: 70 Did You Nutritional therapist?: Yes What Type of College Degree Do you Have?: Attended some college Did You Have An Individualized Education Program (IIEP): No Did You Have Any Difficulty At School?: No Patient's Education Has Been Impacted by Current Illness: No   CCA Family/Childhood  History Family and Relationship History: Family history Marital status: Long term relationship Long term relationship, how long?: 2 years What types of issues is patient dealing with in the relationship?: Patient reports none Additional relationship information: Identifies the relationship as supportive Does patient have children?: No  Childhood History:  Childhood History By whom was/is the patient raised?: Both parents Did patient suffer any verbal/emotional/physical/sexual abuse as a child?: No Did patient suffer from severe childhood neglect?: No Has patient ever been sexually abused/assaulted/raped as an adolescent or adult?: No Was the patient ever a victim of a crime or a disaster?: No Witnessed domestic violence?: No Has patient been affected by domestic violence as an adult?: No       CCA Substance Use Alcohol/Drug Use: Alcohol / Drug Use Pain Medications: See MAR Prescriptions: See MAR Over the Counter: See MAR History of alcohol / drug use?: No history of alcohol / drug abuse Longest period of sobriety (when/how long): N/A Negative Consequences of Use:  (N/A) Withdrawal Symptoms:  (N/A)                         ASAM's:  Six Dimensions of Multidimensional Assessment  Dimension 1:  Acute Intoxication and/or Withdrawal Potential:      Dimension 2:  Biomedical Conditions and Complications:      Dimension 3:  Emotional, Behavioral, or Cognitive Conditions and Complications:     Dimension 4:  Readiness to Change:     Dimension 5:  Relapse, Continued use, or Continued Problem Potential:     Dimension 6:  Recovery/Living Environment:     ASAM Severity Score:    ASAM Recommended Level of Treatment:     Substance use Disorder (SUD)    Recommendations for Services/Supports/Treatments:    Discharge Disposition:    DSM5 Diagnoses: Patient Active Problem List   Diagnosis Date Noted   Hidradenitis 04/29/2022   Asthma 12/03/2021   GERD  (gastroesophageal reflux disease) 12/03/2021   Endometriosis 12/03/2021   Morbid obesity ([place]) 12/03/2021   S/P gastric bypass 12/03/2021   Nicotine dependence with current use 12/03/2021   Generalized anxiety disorder 07/08/2020   Mild episode of recurrent major depressive disorder ([place]) 07/08/2020     Referrals to Alternative Service(s): Referred to Alternative Service(s):   Place:   Date:   Time:    Referred to Alternative Service(s):   Place:   Date:   Time:    Referred  to Alternative Service(s):   Place:   Date:   Time:    Referred to Alternative Service(s):   Place:   Date:   Time:     Cleda Clarks, Theresia Majors

## 2022-10-07 NOTE — Progress Notes (Signed)
Psychoeducational Group Note  Date:  10/07/2022 Time:  2113  Group Topic/Focus:  Relapse Prevention Planning:   The focus of this group is to define relapse and discuss the need for planning to combat relapse.  Participation Level: Did Not Attend  Participation Quality:  Not Applicable  Affect:  Not Applicable  Cognitive:  Not Applicable  Insight:  Not Applicable  Engagement in Group: Not Applicable  Additional Comments:  The patient did not attend group this evening.   Archie Balboa S 10/07/2022, 9:13 PM

## 2022-10-07 NOTE — ED Notes (Signed)
Patient walks to door way and slammed door shut, explained to patient the door can't be shut because we have to be able to see her. Patient gets up and punches sharp box

## 2022-10-07 NOTE — Progress Notes (Addendum)
[facility] Admission Note:  Per ED Report: Rebecca Velasquez is a 37 y.o. year-old female presents to the ED with chief complaint of suicidal thoughts and depression.  States that she doesn't want to live.  States that she wanted to overdose of pills.  Also reports to cutting herself.  BIB sister, who is concerned that patient is going to kill herself.  Recent change in antidepressant.   [facility] Admission Note: Pt involuntarily admitted to [facility] from [facility] on 10/07/22 at 1215. Pt admitted due to SI with a plan to cut herself or overdose on prescribed medications. Pt denies SI/HI/AVH during admission and verbally contracts for safety. Pt reports "I am here because I had a bad day yesterday and I began to scream in front of my boyfriend and sister and then took a dull razor blade and tried to cut myself. My boyfriend and sister forced me to go to the ED after that". Pt reports that her main stressors are; her job as a Air traffic controller at [employer], financial stress, and conflict between her brother and sister (both of them demand her time and she feels torn between them). Pt reports; "I feel betrayed right now by my sister and boyfriend for involuntarily committing me here". Pt tearful throughout admission but remained cooperative. Pt denies alcohol use. Pt reports smoking tobacco 1 pack cigarettes/ day as well as weekly marijuana use. Pt did not bring any belongings to the hospital. Pt has self harm cut scars on left forearm and on chest. Pt has multiple tattoos throughout body. Pt oriented to unit and provided with a water and lunch tray. Pt is safe on the unit at this time. Q 15 minute safety checks initiated for safety.

## 2022-10-07 NOTE — ED Triage Notes (Signed)
Patient arrived with sister who states she has made statements throughout the day about killing herself. States she was going to take a bunch of pills and attempted to cut herself.

## 2022-10-07 NOTE — ED Notes (Signed)
Pt dressed out into burgundy scrubs. Pt has 2 personal belonging's bag and a backpack, including her cell phone. All labeled and were taken home by her sister. Security wanded pt

## 2022-10-07 NOTE — Progress Notes (Addendum)
Pt was accepted to [facility] 10/07/2022, pending IVC paperwork faxed to [phone].  Pt meets inpatient criteria per Charmaine Downs, NP  Attending Physician will be Janine Limbo, MD  Report can be called to: Adult unit: [phone]  Bed is ready now  Care Team Notified: [facility] Lynnda Shields, RN, Charmaine Downs, NP, Sibyl Parr, RN, Resa Miner, RN, Sheliah Plane, RN, and Cornelia Copa, RN  Denna Haggard, [place]  10/07/2022 10:19 AM

## 2022-10-07 NOTE — ED Triage Notes (Signed)
Report given to [facility] at [facility] requested to send pt after 11am.

## 2022-10-07 NOTE — ED Notes (Signed)
Patient crying in room and hitting bed. MD and primary RN aware. Patient comes out of room and asked if someone can please call 911. MD explained that she is IVCd and the process patient walks back to room crying.

## 2022-10-07 NOTE — ED Notes (Signed)
Patient off unit to [facility] per provider. Patient alert and no s//s of distress.  Patient ambulatory off unit, escort and transported by [employer].

## 2022-10-08 MED ORDER — CLONAZEPAM 0.5 MG PO TABS
0.5000 mg | ORAL_TABLET | Freq: Every day | ORAL | Status: DC
  Administered 2022-10-08 – 2022-10-11 (×4): 0.5 mg via ORAL
  Filled 2022-10-08 (×4): qty 1

## 2022-10-08 MED ORDER — SERTRALINE HCL 50 MG PO TABS
50.0000 mg | ORAL_TABLET | Freq: Every day | ORAL | Status: DC
  Administered 2022-10-10 – 2022-10-12 (×3): 50 mg via ORAL
  Filled 2022-10-08 (×5): qty 1

## 2022-10-08 MED ORDER — ARIPIPRAZOLE 5 MG PO TABS
5.0000 mg | ORAL_TABLET | Freq: Every day | ORAL | Status: DC
  Administered 2022-10-09 – 2022-10-12 (×4): 5 mg via ORAL
  Filled 2022-10-08 (×8): qty 1

## 2022-10-08 MED ORDER — SERTRALINE HCL 25 MG PO TABS
25.0000 mg | ORAL_TABLET | Freq: Every day | ORAL | Status: AC
  Administered 2022-10-09: 25 mg via ORAL
  Filled 2022-10-08: qty 1

## 2022-10-08 NOTE — Group Note (Signed)
LCSW Group Therapy Note   Group Date: 10/08/2022 Start Time: 1100 End Time: 1200   Type of Therapy and Topic:  Group Therapy: Boundaries  Participation Level:  Did Not Attend  Description of Group: This group will address the use of boundaries in their personal lives. Patients will explore why boundaries are important, the difference between healthy and unhealthy boundaries, and negative and postive outcomes of different boundaries and will look at how boundaries can be crossed.  Patients will be encouraged to identify current boundaries in their own lives and identify what kind of boundary is being set. Facilitators will guide patients in utilizing problem-solving interventions to address and correct types boundaries being used and to address when no boundary is being used. Understanding and applying boundaries will be explored and addressed for obtaining and maintaining a balanced life. Patients will be encouraged to explore ways to assertively make their boundaries and needs known to significant others in their lives, using other group members and facilitator for role play, support, and feedback.  Therapeutic Goals:  1.  Patient will identify areas in their life where setting clear boundaries could be  used to improve their life.  2.  Patient will identify signs/triggers that a boundary is not being respected. 3.  Patient will identify two ways to set boundaries in order to achieve balance in  their lives: 4.  Patient will demonstrate ability to communicate their needs and set boundaries  through discussion and/or role plays   Therapeutic Modalities:   Cognitive Behavioral Therapy Solution-Focused Therapy  Windle Guard, LCSW 10/08/2022  1:26 PM

## 2022-10-08 NOTE — Progress Notes (Signed)
Patient seen crying on the phone

## 2022-10-08 NOTE — [facility] Suicide Risk Assessment (Signed)
Suicide Risk Assessment  Admission Assessment    [facility] Admission Suicide Risk Assessment   Nursing information obtained from:  Patient  Demographic factors:  Caucasian, Low socioeconomic status  Current Mental Status:  NA  Loss Factors:  Financial problems / change in socioeconomic status  Historical Factors:  NA  Risk Reduction Factors:  Sense of responsibility to family, Employed, Living with another person, especially a relative, Positive social support  Total Time spent with patient: 1 hour  Principal Problem: Major depressive disorder, recurrent severe without psychotic features ([place])  Diagnosis:  Principal Problem:   Major depressive disorder, recurrent severe without psychotic features ([place])  Subjective Data: See H&P  Continued Clinical Symptoms:  Alcohol Use Disorder Identification Test Final Score (AUDIT): 0 The "Alcohol Use Disorders Identification Test", Guidelines for Use in Primary Care, Second Edition.  World Pharmacologist [facility]). Score between 0-7:  no or low risk or alcohol related problems. Score between 8-15:  moderate risk of alcohol related problems. Score between 16-19:  high risk of alcohol related problems. Score 20 or above:  warrants further diagnostic evaluation for alcohol dependence and treatment.  CLINICAL FACTORS:   Severe Anxiety and/or Agitation Depression:   Impulsivity Alcohol/Substance Abuse/Dependencies More than one psychiatric diagnosis Previous Psychiatric Diagnoses and Treatments  Musculoskeletal: Strength & Muscle Tone: within normal limits Gait & Station: normal Patient leans: N/A  Psychiatric Specialty Exam:  Presentation  General Appearance: Casual; Fairly Groomed   Eye Contact:Good   Speech:Clear and Coherent; Normal Rate   Speech Volume:Normal   Handedness:Right   Mood and Affect  Mood:Depressed; Anxious   Affect:Congruent; Depressed; Tearful   Thought Process  Thought Processes:Coherent; Goal  Directed; Linear   Descriptions of Associations:Intact   Orientation:Full (Time, Place and Person)   Thought Content:Logical   History of Schizophrenia/Schizoaffective disorder:No  Duration of Psychotic Symptoms:No data recorded  Hallucinations:Hallucinations: None   Ideas of Reference:None   Suicidal Thoughts:Suicidal Thoughts: No   Homicidal Thoughts:Homicidal Thoughts: No   Sensorium  Memory:Immediate Good; Recent Good; Remote Good   Judgment:Fair   Insight:Fair   Executive Functions  Concentration:Fair   Attention Span:Fair   [place]   Language:Good  Psychomotor Activity  Psychomotor Activity:Psychomotor Activity: Normal   Assets  Assets:Communication Skills; Desire for Improvement; Financial Resources/Insurance; Housing; Physical Health; Resilience; Social Support  Sleep  Sleep:Number of Hours of Sleep: 6  Physical Exam: Blood pressure 105/70, pulse 64, temperature 97.9 F (36.6 C), temperature source Oral, resp. rate 16, height 5\' 1"  (1.549 m), weight 79.4 kg, SpO2 97 %. Body mass index is 33.07 kg/m.  COGNITIVE FEATURES THAT CONTRIBUTE TO RISK:  Closed-mindedness, Polarized thinking, and Thought constriction (tunnel vision)    SUICIDE RISK:   Severe:  Frequent, intense, and enduring suicidal ideation, specific plan, no subjective intent, but some objective markers of intent (i.e., choice of lethal method), the method is accessible, some limited preparatory behavior, evidence of impaired self-control, severe dysphoria/symptomatology, multiple risk factors present, and few if any protective factors, particularly a lack of social support.  PLAN OF CARE: See H&P.  I certify that inpatient services furnished can reasonably be expected to improve the patient's condition.   Lindell Spar, NP, pmhmp, fnp-bc 10/08/2022, 11:45 AM

## 2022-10-08 NOTE — Progress Notes (Signed)
Patient reports to the medication window, crying, states that the anxiety medication isnt working. States she stays with her sister because she does not like to be alone, patient is visibly shaking with pressured speech.

## 2022-10-08 NOTE — Progress Notes (Signed)
[place] Group Notes:  (Nursing/MHT/Case Management/Adjunct)  Date:  10/08/2022  Time:  2015 Type of Therapy:   wrap up group  Participation Level:  Active  Participation Quality:  Appropriate, Attentive, Sharing, and Supportive  Affect:  Appropriate  Cognitive:  Alert  Insight:  Improving  Engagement in Group:  Engaged  Modes of Intervention:  Clarification, Education, and Support  Summary of Progress/Problems: Positive thinking and positive change were discussed.   Shellia Cleverly 10/08/2022, 9:31 PM

## 2022-10-08 NOTE — BHH Group Notes (Signed)
Adult Psychoeducational Group Note  Date:  10/08/2022 Time:  5:03 PM  Group Topic/Focus:  Managing Feelings:   The focus of this group is to identify what feelings patients have difficulty handling and develop a plan to handle them in a healthier way upon discharge.  Participation Level:  Active  Participation Quality:  Appropriate  Affect:  Appropriate  Cognitive:  Alert and Appropriate  Insight: Appropriate, Good, and Improving  Engagement in Group:  Engaged  Modes of Intervention:  Discussion  Additional Comments:  Pt attended group and participated in group discussion.  Anie Juniel R Jakyron Fabro 10/08/2022, 5:03 PM

## 2022-10-08 NOTE — Group Note (Signed)
LCSW Group Therapy Note   Group Date: 10/08/2022 Start Time: 1100 End Time: 1200   Type of Therapy and Topic:  Group Therapy: Boundaries  Participation Level:  Did Not Attend  Description of Group: This group will address the use of boundaries in their personal lives. Patients will explore why boundaries are important, the difference between healthy and unhealthy boundaries, and negative and postive outcomes of different boundaries and will look at how boundaries can be crossed.  Patients will be encouraged to identify current boundaries in their own lives and identify what kind of boundary is being set. Facilitators will guide patients in utilizing problem-solving interventions to address and correct types boundaries being used and to address when no boundary is being used. Understanding and applying boundaries will be explored and addressed for obtaining and maintaining a balanced life. Patients will be encouraged to explore ways to assertively make their boundaries and needs known to significant others in their lives, using other group members and facilitator for role play, support, and feedback.  Therapeutic Goals:  1.  Patient will identify areas in their life where setting clear boundaries could be  used to improve their life.  2.  Patient will identify signs/triggers that a boundary is not being respected. 3.  Patient will identify two ways to set boundaries in order to achieve balance in  their lives: 4.  Patient will demonstrate ability to communicate their needs and set boundaries  through discussion and/or role plays   Therapeutic Modalities:   Cognitive Behavioral Therapy Solution-Focused Therapy  Rebecca Regal Nakita Santerre, LCSW 10/08/2022  1:32 PM

## 2022-10-08 NOTE — BHH Counselor (Signed)
Adult Comprehensive Assessment  Patient ID: Rebecca Velasquez, female   DOB: [birth year], 37 y.o.   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" Family Relationships: "Feel like I have to take care of my sister who has mental health issues as well , she has not been able to work for about 3 months  and causing stress on me  because I have to work extra hours to help care for herPublishing copy / Lack of resources (include bankruptcy): "not being paid enough and working more hours than others who make more than I do" Housing / Lack of housing: "No" Physical health (include injuries & life threatening diseases): "No" Social relationships: "No" Substance abuse: "No" Bereavement / Loss: "my brother , parents, and grandparents all passed 3 years apart"  Living/Environment/Situation:  Living Arrangements: Spouse/significant other Living conditions (as described by patient or guardian): " usually good" Who else lives in the home?: Patient and her boyfriend How long has patient lived in current situation?: under a year What is atmosphere in current home: Supportive, Comfortable  Family History:  Marital status: Long term relationship Long term relationship, how long?: Over 2 years What types of issues is patient dealing with in the relationship?: "none" Additional relationship information: " he is supportive" Are you sexually active?: Yes What is your sexual orientation?: heterosexual Has your sexual activity been affected by drugs, alcohol, medication, or  emotional stress?: "No" Does patient have children?: No  Childhood History:  By whom was/is the patient raised?: Both parents Description of patient's relationship with caregiver when they were a child: "good but they had their faults" Patient's description of current relationship with people who raised him/her: all deceased How were you disciplined when you got in trouble as a child/adolescent?: "I got yelled at" Does patient have siblings?: Yes Number of Siblings: 1 Description of patient's current relationship with siblings: Older sister I am close with Did patient suffer any verbal/emotional/physical/sexual abuse as a child?: No Did patient suffer from severe childhood neglect?: No Has patient ever been sexually abused/assaulted/raped as an adolescent or adult?: No Was the patient ever a victim of a crime or a disaster?: No Witnessed domestic violence?: No Has patient been affected by domestic violence as an adult?: No  Education:  Highest grade of school patient has completed: Some Secretary/administrator Currently a student?: No Learning disability?: No  Employment/Work Situation:   Employment Situation: Employed 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 Patient Been Employed?: 4 years Are You Satisfied With Your Job?: Yes Do You Work More Than One Job?: No Work Stressors: Patient reports falling behind at work- " it has become too much and I cannot keep up with all the groomings I am assigned to" Patient's Job has Been Impacted by Current Illness: No What is the Longest Time Patient has Held a Job?: 4 years Where was the Patient Employed at that Time?: [employer] Has Patient ever Been in the [employer]?: No  Financial Resources:   Financial resources: Income from employment, Private insurance Does patient have a representative payee or guardian?: No  Alcohol/Substance Abuse:  What has been your use of drugs/alcohol within the last 12 months?: N/A If attempted suicide,  did drugs/alcohol play a role in this?: No If yes, describe treatment: N/A Has alcohol/substance abuse ever caused legal problems?: No  Social Support System:   Patient's Community Support System: Good Describe Community Support System: "boyfriend and sister" Type of faith/religion: "No" How does patient's faith help to cope with current illness?: "No"  Leisure/Recreation:   Do You Have Hobbies?: Yes Leisure and Hobbies: Art, music, movies and reading  Strengths/Needs:   What is the patient's perception of their strengths?: "I work well with dogs" Patient states they can use these personal strengths during their treatment to contribute to their recovery: N/A Patient states these barriers may affect/interfere with their treatment: N/A Patient states these barriers may affect their return to the community: N/A Other important information patient would like considered in planning for their treatment: N/A  Discharge Plan:   Currently receiving community mental health services: Yes (From Whom) ([place], [place]) Patient states concerns and preferences for aftercare planning are: No Patient states they will know when they are safe and ready for discharge when: No Does patient have access to transportation?: Yes Does patient have financial barriers related to discharge medications?: No Patient description of barriers related to discharge medications: N/A Will patient be returning to same living situation after discharge?: Yes  Summary/Recommendations:   Summary and Recommendations (to be completed by the evaluator): Rebecca Velasquez is a 37 y/o female who was admitted to hospital for suicidal thoughts and depression. She states that she does not want to live and wanted to overdose on pills. Rebecca Velasquez has a psychiatric history of GAD and MDD, with recent suicidal thoughts. Rebecca Velasquez, current stressors are her job, finances, and family . Rebecca Velasquez is currently being followed by [facility] for her mental health needs. Rebecca Velasquez , has a safe DC plan back with her boyfriend and had access to transportation. Furthermore during assessment patient was very teary when talking about her family; states her sister was suicidal last week and her brother, parents, and grandparents all died three years apart. " I feel like I will lose her too because she is also battling mental health issues".While here, Rebecca Velasquez can benefit from crisis stabilization, medication management, therapeutic milieu, and referrals for services.   Rebecca Velasquez. 10/08/2022

## 2022-10-08 NOTE — H&P (Signed)
Psychiatric Admission Assessment Adult  Patient Identification: Rebecca Velasquez  MRN:  [id number]  Date of Evaluation:  10/08/2022  Chief Complaint: Worsening symptoms of depression triggering suicidal ideations with plan to over dose on medication.  Principal Diagnosis: Major depressive disorder, recurrent severe without psychotic features (HCC)  Diagnosis:  Principal Problem:   Major depressive disorder, recurrent severe without psychotic features (HCC)  History of Present Illness: This is the first psychiatric admission/evaluation in this bHH for this 37 year old Caucasian female with hx of ADH, anxiety disorder & major depressive disorder. Admitted from the [facility] ED with complain of worsening symptoms of depression & suicidal ideations with plan to overdose oh er medications. Patient apparently made a statement to her boyfriend that she did not want to live any more. Chart review reports indicated that she was also threatening to cut her wrist. She was taken to the [facility] long hospital for evaluation/ After medical evaluation/stabilization, Rebecca Velasquez was transferred to the [facility] for further psychiatric evaluation/treatments. During this evaluation, Rebecca Velasquez tearfully reports,   "My boyfriend & my sister dropped me off at the emergency room on Feb 07, 2023 (3 days ago). I was just having a real bad day on that 02/07/2023. And to help me feel better, I felt like reaping my skin open.. I tried to break  part of a razor blade to use one of the razors to cut my wrist. I was not trying to kill myself, I was just trying to deep breath. But my boyfriend saw me, called my sister & my sister called the cops. When the cops came & left, I was taken to the hospital by my sister & my boyfriend. A lot of things has been piling up prior to this event. I was working up to 55 hours a week lately. I was not taking good care of myself. All these has been going on for two months. On that 07-Feb-2023, I was not feeling  myself. I knew I could not go to or function at work that day. I called out from work. My boss was disappointed that I called out because they have to do all the work that day by themselves. There have been times when I had worked under the same condition. There have been many times that we have worked short as well & why was it different on that 02/07/23? I have been depressed for a long time. I do have a therapist & a psychiatrist in a clinic in [place], [place]. I just saw them few days prior to being taken to the ED. I started treatment for depression in 02/07/07 prior to the death of my father. I have had a lot of deaths back to back since the death of my father. My mother died in Feb 07, 2011 & in Feb 06, 2013, my brother died from unintentional drug overdose. He was a heroin addict. I have been tried on Cymbalta & other depression medicines that did not help me. So, my psychiatrist decided to try me on Caplyta because it has worked for my sister who has bipolar disorder. She also has tried a lot of medicines that did not help her until Caplyta. I have just had two doses of this Caplyta prior to coming to the hospital. I would want to get back on my Caplyta, Adderall, Klonopin & Trazodone. I do have a lot of racing thoughts. I do have hx of ADH that flares up from time to time. Prior to starting Klonopin, I was on Ativan that did  not help me much because my anxiety has always been intense. I'm not feeling SIHI. Again, I would like to be put back on Caplyta, Klonipin, Adderall & Trazodone.  Patient also denies any  AVH, delusional thoughts or paranoia. She does not appear to be responding to any internal stimuli.  Associated Signs/Symptoms:  Depression Symptoms:  depressed mood, insomnia, psychomotor agitation, feelings of worthlessness/guilt, hopelessness, anxiety,  (Hypo) Manic Symptoms:  Impulsivity, Labiality of Mood,  Anxiety Symptoms:  Excessive Worry,  Psychotic Symptoms:   Patient currently denies any AVH,  delusional thoughts or paranoia. She does not appear to be responding to any internal stimuli.  PTSD Symptoms: Denies. NA  Total Time spent with patient: 1 hour  Past Psychiatric History: Major depressive disorder, ADHD, anxiety disorder.  Is the patient at risk to self? No.  Has the patient been a risk to self in the past 6 months? Yes.    Has the patient been a risk to self within the distant past? Yes.    Is the patient a risk to others? No.  Has the patient been a risk to others in the past 6 months? No.  Has the patient been a risk to others within the distant past? No.   [place] Scale:  [place] Admission (Current) from 10/07/2022 in [place] [facility] Most recent reading at 10/07/2022  1:10 PM ED from 10/07/2022 in [place] DEPT Most recent reading at 10/07/2022  1:02 AM ED from 07/13/2022 in [facility] Most recent reading at 07/13/2022  8:01 PM  C-SSRS RISK CATEGORY No Risk High Risk No Risk      Prior Inpatient Therapy: No. If yes, describe: NA.   Prior Outpatient Therapy: Yes.   If yes, describe: "I see Dorna Mai.   Alcohol Screening: 1. How often do you have a drink containing alcohol?: Never 2. How many drinks containing alcohol do you have on a typical day when you are drinking?: 1 or 2 3. How often do you have six or more drinks on one occasion?: Never AUDIT-C Score: 0 4. How often during the last year have you found that you were not able to stop drinking once you had started?: Never 5. How often during the last year have you failed to do what was normally expected from you because of drinking?: Never 6. How often during the last year have you needed a first drink in the morning to get yourself going after a heavy drinking session?: Never 7. How often during the last year have you had a feeling of guilt of remorse after drinking?: Never 8. How often during the last year  have you been unable to remember what happened the night before because you had been drinking?: Never 9. Have you or someone else been injured as a result of your drinking?: No 10. Has a relative or friend or a doctor or another health worker been concerned about your drinking or suggested you cut down?: No Alcohol Use Disorder Identification Test Final Score (AUDIT): 0  Substance Abuse History in the last 12 months:  Yes.    Consequences of Substance Abuse: Discussed with patient during this admission evaluation. Medical Consequences:  Liver damage, Possible death by overdose Legal Consequences:  Arrests, jail time, Loss of driving privilege. Family Consequences:  Family discord, divorce and or separation.  Previous Psychotropic Medications:  Yes, Trintillix, Caplyta, Klonopin, trazodone.  Psychological Evaluations: No   Past Medical History:  Past  Medical History:  Diagnosis Date   Asthma    Celiac disease    Depression    Endometriosis     Past Surgical History:  Procedure Laterality Date   GASTRIC BYPASS OPEN  2016   IVC FILTER INSERTION  2016   Family History:  Family History  Problem Relation Age of Onset   Rheum arthritis Mother    Cancer Mother    Diabetes Father    Cancer Father    Family Psychiatric  History: Bipolar disorder: Sister.                                                  Drug (opioid)addiction: Brother.                                                  Drug overdose resulting in death: Brother.  Tobacco Screening: Smokes a pack of cigarettes daily. Social History   Tobacco Use  Smoking Status Every Day   Packs/day: 1.00   Types: Cigarettes  Smokeless Tobacco Never  Tobacco Comments   Pt interested in quitting smoking    BH Tobacco Counseling     Are you interested in Tobacco Cessation Medications?  No, patient refused Counseled patient on smoking cessation:  Yes Reason Tobacco Screening Not Completed: No value filed.       Social  History:  Social History   Substance and Sexual Activity  Alcohol Use Never     Social History   Substance and Sexual Activity  Drug Use Yes   Types: Marijuana    Additional Social History:  Allergies:  No Known Allergies  Lab Results:  Results for orders placed or performed during the hospital encounter of 10/07/22 (from the past 48 hour(s))  Comprehensive metabolic panel     Status: Abnormal   Collection Time: 10/07/22  1:18 AM  Result Value Ref Range   Sodium 136 135 - 145 mmol/L   Potassium 3.6 3.5 - 5.1 mmol/L   Chloride 106 98 - 111 mmol/L   CO2 22 22 - 32 mmol/L   Glucose, Bld 93 70 - 99 mg/dL    Comment: Glucose reference range applies only to samples taken after fasting for at least 8 hours.   BUN 10 6 - 20 mg/dL   Creatinine, Ser 1.61 0.44 - 1.00 mg/dL   Calcium 8.8 (L) 8.9 - 10.3 mg/dL   Total Protein 7.0 6.5 - 8.1 g/dL   Albumin 3.7 3.5 - 5.0 g/dL   AST 14 (L) 15 - 41 U/L   ALT 12 0 - 44 U/L   Alkaline Phosphatase 65 38 - 126 U/L   Total Bilirubin 0.3 0.3 - 1.2 mg/dL   GFR, Estimated >09 >60 mL/min    Comment: (NOTE) Calculated using the CKD-EPI Creatinine Equation (2021)    Anion gap 8 5 - 15    Comment: Performed at [facility], [address] [place], [place] [address]  Ethanol     Status: None   Collection Time: 10/07/22  1:18 AM  Result Value Ref Range   Alcohol, Ethyl (B) <10 <10 mg/dL    Comment: (NOTE) Lowest detectable limit for serum alcohol is 10 mg/dL.  For  medical purposes only. Performed at [facility], [place] [address], [place], [place] [address]   Salicylate level     Status: Abnormal   Collection Time: 10/07/22  1:18 AM  Result Value Ref Range   Salicylate Lvl <4.2 (L) 7.0 - 30.0 mg/dL    Comment: Performed at [facility], [place] [address], [place], [place] [address]  Acetaminophen level     Status: Abnormal   Collection Time: 10/07/22  1:18 AM  Result Value Ref Range    Acetaminophen (Tylenol), Serum <10 (L) 10 - 30 ug/mL    Comment: (NOTE) Therapeutic concentrations vary significantly. A range of 10-30 ug/mL  may be an effective concentration for many patients. However, some  are best treated at concentrations outside of this range. Acetaminophen concentrations >150 ug/mL at 4 hours after ingestion  and >50 ug/mL at 12 hours after ingestion are often associated with  toxic reactions.  Performed at [facility], [place] [address], [place], [place] [address]   cbc     Status: None   Collection Time: 10/07/22  1:18 AM  Result Value Ref Range   WBC 9.1 4.0 - 10.5 K/uL   RBC 4.00 3.87 - 5.11 MIL/uL   Hemoglobin 12.2 12.0 - 15.0 g/dL   HCT 36.9 36.0 - 46.0 %   MCV 92.3 80.0 - 100.0 fL   MCH 30.5 26.0 - 34.0 pg   MCHC 33.1 30.0 - 36.0 g/dL   RDW 12.5 11.5 - 15.5 %   Platelets 278 150 - 400 K/uL   nRBC 0.0 0.0 - 0.2 %    Comment: Performed at [facility], [place] [address], [place], [place] [address]  Rapid urine drug screen (hospital performed)     Status: Abnormal   Collection Time: 10/07/22  1:18 AM  Result Value Ref Range   Opiates NONE DETECTED NONE DETECTED   Cocaine NONE DETECTED NONE DETECTED   Benzodiazepines NONE DETECTED NONE DETECTED   Amphetamines NONE DETECTED NONE DETECTED   Tetrahydrocannabinol POSITIVE (A) NONE DETECTED   Barbiturates NONE DETECTED NONE DETECTED    Comment: (NOTE) DRUG SCREEN FOR MEDICAL PURPOSES ONLY.  IF CONFIRMATION IS NEEDED FOR ANY PURPOSE, NOTIFY LAB WITHIN 5 DAYS.  LOWEST DETECTABLE LIMITS FOR URINE DRUG SCREEN Drug Class                     Cutoff (ng/mL) Amphetamine and metabolites    1000 Barbiturate and metabolites    200 Benzodiazepine                 200 Opiates and metabolites        300 Cocaine and metabolites        300 THC                            50 Performed at [facility], [place] [address], [place], [place] [address]   Resp panel  by RT-PCR (RSV, Flu A&B, Covid) Anterior Nasal Swab     Status: None   Collection Time: 10/07/22  1:18 AM   Specimen: Anterior Nasal Swab  Result Value Ref Range   SARS Coronavirus 2 by RT PCR NEGATIVE NEGATIVE    Comment: (NOTE) SARS-CoV-2 target nucleic acids are NOT DETECTED.  The SARS-CoV-2 RNA is generally detectable in upper respiratory specimens during the acute phase of infection. The lowest concentration of SARS-CoV-2 viral copies this assay  can detect is 138 copies/mL. A negative result does not preclude SARS-Cov-2 infection and should not be used as the sole basis for treatment or other patient management decisions. A negative result may occur with  improper specimen collection/handling, submission of specimen other than nasopharyngeal swab, presence of viral mutation(s) within the areas targeted by this assay, and inadequate number of viral copies(<138 copies/mL). A negative result must be combined with clinical observations, patient history, and epidemiological information. The expected result is Negative.  Fact Sheet for Patients:  BloggerCourse.com  Fact Sheet for Healthcare Providers:  SeriousBroker.it  This test is no t yet approved or cleared by the [place] FDA and  has been authorized for detection and/or diagnosis of SARS-CoV-2 by FDA under an Emergency Use Authorization (EUA). This EUA will remain  in effect (meaning this test can be used) for the duration of the COVID-19 declaration under Section 564(b)(1) of the Act, 21 U.S.C.section 360bbb-3(b)(1), unless the authorization is terminated  or revoked sooner.       Influenza A by PCR NEGATIVE NEGATIVE   Influenza B by PCR NEGATIVE NEGATIVE    Comment: (NOTE) The Xpert Xpress SARS-CoV-2/FLU/RSV plus assay is intended as an aid in the diagnosis of influenza from Nasopharyngeal swab specimens and should not be used as a sole basis for treatment. Nasal  washings and aspirates are unacceptable for Xpert Xpress SARS-CoV-2/FLU/RSV testing.  Fact Sheet for Patients: BloggerCourse.com  Fact Sheet for Healthcare Providers: SeriousBroker.it  This test is not yet approved or cleared by the [place] FDA and has been authorized for detection and/or diagnosis of SARS-CoV-2 by FDA under an Emergency Use Authorization (EUA). This EUA will remain in effect (meaning this test can be used) for the duration of the COVID-19 declaration under Section 564(b)(1) of the Act, 21 U.S.C. section 360bbb-3(b)(1), unless the authorization is terminated or revoked.     Resp Syncytial Virus by PCR NEGATIVE NEGATIVE    Comment: (NOTE) Fact Sheet for Patients: BloggerCourse.com  Fact Sheet for Healthcare Providers: SeriousBroker.it  This test is not yet approved or cleared by the [place] FDA and has been authorized for detection and/or diagnosis of SARS-CoV-2 by FDA under an Emergency Use Authorization (EUA). This EUA will remain in effect (meaning this test can be used) for the duration of the COVID-19 declaration under Section 564(b)(1) of the Act, 21 U.S.C. section 360bbb-3(b)(1), unless the authorization is terminated or revoked.  Performed at [facility], [address] [place], [place] [address]   hCG, quantitative, pregnancy     Status: None   Collection Time: 10/07/22  1:18 AM  Result Value Ref Range   hCG, Beta Chain, Quant, S <1 <5 mIU/mL    Comment:          GEST. AGE      CONC.  (mIU/mL)   <=1 WEEK        5 - 50     2 WEEKS       50 - 500     3 WEEKS       100 - 10,000     4 WEEKS     1,000 - 30,000     5 WEEKS     3,500 - 115,000   6-8 WEEKS     12,000 - 270,000    12 WEEKS     15,000 - 220,000        FEMALE AND NON-PREGNANT FEMALE:     LESS THAN 5 mIU/mL Performed at  [facility], [address] [place], [place] [id number]    Blood Alcohol level:  Lab Results  Component Value Date   ETH <10 10/07/2022   Metabolic Disorder Labs:  Lab Results  Component Value Date   HGBA1C 5.6 12/03/2021   No results found for: "PROLACTIN" Lab Results  Component Value Date   CHOL 193 12/03/2021   TRIG 97.0 12/03/2021   HDL 45.90 12/03/2021   CHOLHDL 4 12/03/2021   VLDL 19.4 12/03/2021   LDLCALC 128 (H) 12/03/2021   Current Medications: Current Facility-Administered Medications  Medication Dose Route Frequency Provider Last Rate Last Admin   acetaminophen (TYLENOL) tablet 650 mg  650 mg Oral Q6H PRN Dahlia Byesnuoha, Josephine C, NP   650 mg at 10/08/22 [id number]   albuterol (VENTOLIN HFA) 108 (90 Base) MCG/ACT inhaler 2 puff  2 puff Inhalation Q6H PRN Aishwarya Shiplett, Nicole KindredAgnes I, NP       alum & mag hydroxide-simeth (MAALOX/MYLANTA) 200-200-20 MG/5ML suspension 30 mL  30 mL Oral Q4H PRN Earney Navynuoha, Josephine C, NP       [START ON 10/09/2022] ARIPiprazole (ABILIFY) tablet 5 mg  5 mg Oral Daily Massengill, Nathan, MD       clonazePAM Scarlette Calico(KLONOPIN) tablet 0.5 mg  0.5 mg Oral QHS Massengill, Harrold DonathNathan, MD       hydrOXYzine (ATARAX) tablet 25 mg  25 mg Oral Q6H PRN Armandina StammerNwoko, Gillermo Poch I, NP   25 mg at 10/08/22 0950   LORazepam (ATIVAN) tablet 0.5 mg  0.5 mg Oral Q6H PRN Armandina StammerNwoko, Shiana Rappleye I, NP   0.5 mg at 10/07/22 1514   magnesium hydroxide (MILK OF MAGNESIA) suspension 30 mL  30 mL Oral Daily PRN Dahlia Byesnuoha, Josephine C, NP       nicotine (NICODERM CQ - dosed in mg/24 hours) patch 21 mg  21 mg Transdermal Daily Massengill, Harrold DonathNathan, MD   21 mg at 10/08/22 0900   pantoprazole (PROTONIX) EC tablet 80 mg  80 mg Oral Daily Massengill, Harrold DonathNathan, MD   80 mg at 10/08/22 0900   [START ON 10/09/2022] sertraline (ZOLOFT) tablet 25 mg  25 mg Oral Daily Massengill, Harrold DonathNathan, MD       Followed by   Melene Muller[START ON 10/10/2022] sertraline (ZOLOFT) tablet 50 mg  50 mg Oral Daily Massengill, Nathan, MD       traZODone (DESYREL) tablet 50 mg  50 mg Oral QHS PRN  Onuoha, Chinwendu V, NP       PTA Medications: Medications Prior to Admission  Medication Sig Dispense Refill Last Dose   albuterol (PROVENTIL) (2.5 MG/3ML) 0.083% nebulizer solution Take 3 mLs (2.5 mg total) by nebulization every 6 (six) hours as needed for wheezing or shortness of breath. 150 mL 1    albuterol (VENTOLIN HFA) 108 (90 Base) MCG/ACT inhaler INHALE 1-2 PUFFS BY MOUTH EVERY 6 HOURS AS NEEDED FOR WHEEZE OR SHORTNESS OF BREATH (Patient taking differently: Inhale 2 puffs into the lungs every 6 (six) hours as needed for wheezing or shortness of breath.) 8.5 each 1    amoxicillin-clavulanate (AUGMENTIN) 875-125 MG tablet Take 1 tablet by mouth 2 (two) times daily. (Patient not taking: Reported on 10/07/2022) 20 tablet 0    amphetamine-dextroamphetamine (ADDERALL) 20 MG tablet Take 20 mg by mouth 2 (two) times daily.      benzonatate (TESSALON) 200 MG capsule Take 1 capsule (200 mg total) by mouth 2 (two) times daily as needed for cough. (Patient not taking: Reported on 10/07/2022) 20 capsule 0    clonazePAM (KLONOPIN) 0.5  MG tablet Take 0.5 mg by mouth 2 (two) times daily.      esomeprazole (NEXIUM) 40 MG packet Take 40 mg by mouth daily before breakfast. (Patient not taking: Reported on 10/07/2022) 90 each 3    fluconazole (DIFLUCAN) 150 MG tablet Take 1 tablet (150 mg total) by mouth every three (3) days as needed. (Patient not taking: Reported on 10/07/2022) 2 tablet 0    fluticasone (FLONASE) 50 MCG/ACT nasal spray Place 2 sprays into both nostrils daily. (Patient not taking: Reported on 10/07/2022) 16 g 0    gabapentin (NEURONTIN) 100 MG capsule TAKE 1 CAPSULE (100 MG TOTAL) BY MOUTH THREE TIMES DAILY. (Patient not taking: Reported on 10/07/2022) 90 capsule 3    gabapentin (NEURONTIN) 300 MG capsule Take 300 mg by mouth 3 (three) times daily.      Lumateperone Tosylate (CAPLYTA) 21 MG CAPS Take 1 capsule by mouth daily.      predniSONE (DELTASONE) 50 MG tablet Take 1 tablet daily for 5 days.  (Patient not taking: Reported on 10/07/2022) 5 tablet 0    tamsulosin (FLOMAX) 0.4 MG CAPS capsule TAKE 1 CAPSULE BY MOUTH EVERY DAY (Patient not taking: Reported on 10/07/2022) 30 capsule 0    traZODone (DESYREL) 100 MG tablet TAKE 1 TABLET BY MOUTH EVERYDAY AT BEDTIME 90 tablet 1    vortioxetine HBr (TRINTELLIX) 5 MG TABS tablet Take 1 tablet (5 mg total) by mouth daily. (Patient not taking: Reported on 10/07/2022) 14 tablet 0    Musculoskeletal: Strength & Muscle Tone: within normal limits Gait & Station: normal Patient leans: N/A  Psychiatric Specialty Exam:  Presentation  General Appearance: Casual; Fairly Groomed   Eye Contact:Good   Speech:Clear and Coherent; Normal Rate   Speech Volume:Normal   Handedness:Right   Mood and Affect  Mood:Depressed; Anxious   Affect:Congruent; Depressed; Tearful   Thought Process  Thought Processes:Coherent; Goal Directed; Linear   Duration of Psychotic Symptoms: Greater than two weeks.  Past Diagnosis of Schizophrenia or Psychoactive disorder: No  Descriptions of Associations:Intact   Orientation:Full (Time, Place and Person)   Thought Content:Logical   Hallucinations:Hallucinations: None   Ideas of Reference:None   Suicidal Thoughts:Suicidal Thoughts: No   Homicidal Thoughts:Homicidal Thoughts: No   Sensorium  Memory:Immediate Good; Recent Good; Remote Good   Judgment:Fair   Insight:Fair   Executive Functions  Concentration:Fair   Attention Span:Fair   Recall:Good   Fund of Knowledge:Fair   Language:Good   Psychomotor Activity  Psychomotor Activity:Psychomotor Activity: Normal   Assets  Assets:Communication Skills; Desire for Improvement; Financial Resources/Insurance; Housing; Physical Health; Resilience; Social Support   Sleep  Sleep:Number of Hours of Sleep: 6  Physical Exam: Physical Exam Vitals and nursing note reviewed.  HENT:     Head: Normocephalic.     Nose: Nose  normal.     Mouth/Throat:     Pharynx: Oropharynx is clear.  Eyes:     Pupils: Pupils are equal, round, and reactive to light.  Cardiovascular:     Rate and Rhythm: Normal rate.     Pulses: Normal pulses.  Pulmonary:     Effort: Pulmonary effort is normal.  Genitourinary:    Comments: Deferred Musculoskeletal:        General: Normal range of motion.  Skin:    General: Skin is warm and dry.  Neurological:     General: No focal deficit present.     Mental Status: She is alert and oriented to person, place, and time.    Review of  Systems  Constitutional:  Negative for chills, diaphoresis and fever.  HENT:  Negative for congestion and sore throat.   Eyes:  Negative for blurred vision.  Respiratory:  Negative for cough, shortness of breath and wheezing.   Cardiovascular:  Negative for chest pain and palpitations.  Gastrointestinal:  Negative for abdominal pain, constipation, diarrhea, heartburn, nausea and vomiting.  Genitourinary:  Negative for dysuria.  Musculoskeletal:  Negative for joint pain and myalgias.  Skin:  Negative for itching and rash.  Neurological:  Negative for dizziness, tingling, tremors, sensory change, speech change, focal weakness, seizures, loss of consciousness, weakness and headaches.  Endo/Heme/Allergies:        NKDA  Psychiatric/Behavioral:  Positive for depression and substance abuse (UDS (+) for THC). Negative for hallucinations, memory loss and suicidal ideas. The patient is nervous/anxious and has insomnia.    Blood pressure 105/70, pulse 64, temperature 97.9 F (36.6 C), temperature source Oral, resp. rate 16, height 5\' 1"  (1.549 m), weight 79.4 kg, SpO2 97 %. Body mass index is 33.07 kg/m.  Treatment Plan Summary: Daily contact with patient to assess and evaluate symptoms and progress in treatment and Medication management.   Principal/active diagnoses.  Major depressive disorder, recurrent severe without psychotic features  (HCC)  Plan: -Continue Abilify 5 mg po daily augment Sertraline for depression. -Continue Sertraline 25 mg po daily x 1 (10-09-22). -Continue Sertraline 50 mg po daily for for depression (start 10-10-22). -Continue Trazodone 50 mg po Q hs prn for insomnia. -Continue Vistaril 25 mg po tid prn for anxiety.  -Continue Klonopin-ODT 0.5 mg po Q hs for anxiety. -Continue Lorazepam 0.5 mg po qid prn for anxiety/withdrawal symptoms. -Continue Nicotine patch 21 mg trans-dermally Q 24 hrs for nicotine withdrawal.   Other medical issues.  -Continue albuterol inhaler 2 puffs Q 6 hrs prn for SOB.  -Continue Protonix 40 mg po Q am for GERD.  Other PRNS -Continue Tylenol 650 mg every 6 hours PRN for mild pain -Continue Maalox 30 ml Q 4 hrs PRN for indigestion -Continue MOM 30 ml po Q 6 hrs for constipation  Safety and Monitoring: Voluntary admission to inpatient psychiatric unit for safety, stabilization and treatment Daily contact with patient to assess and evaluate symptoms and progress in treatment Patient's case to be discussed in multi-disciplinary team meeting Observation Level : q15 minute checks Vital signs: q12 hours Precautions: Safety  Discharge Planning: Social work and case management to assist with discharge planning and identification of hospital follow-up needs prior to discharge Estimated LOS: 5-7 days Discharge Concerns: Need to establish a safety plan; Medication compliance and effectiveness Discharge Goals: Return home with outpatient referrals for mental health follow-up including medication management/psychotherapy  Observation Level/Precautions:  15 minute checks  Laboratory:   Per ED. Current lab results reviewed. Will obtain hgba1c.  Psychotherapy: Enrolled in the group counseling sessions.  Medications: See [facility]  Consultations: As needed.   Discharge Concerns: Safety, mood stability.   Estimated LOS: 3-5 days.  Other: NA     Physician Treatment Plan for Primary  Diagnosis: Major depressive disorder, recurrent severe without psychotic features (HCC)  Long Term Goal(s): Improvement in symptoms so as ready for discharge  Short Term Goals: Ability to identify changes in lifestyle to reduce recurrence of condition will improve, Ability to verbalize feelings will improve, Ability to disclose and discuss suicidal ideas, and Ability to demonstrate self-control will improve  Physician Treatment Plan for Secondary Diagnosis: Principal Problem:   Major depressive disorder, recurrent severe without psychotic features (HCC)  Long Term Goal(s): Improvement in symptoms so as ready for discharge  Short Term Goals: Ability to identify and develop effective coping behaviors will improve, Ability to maintain clinical measurements within normal limits will improve, Compliance with prescribed medications will improve, and Ability to identify triggers associated with substance abuse/mental health issues will improve  I certify that inpatient services furnished can reasonably be expected to improve the patient's condition.    Armandina Stammer, NP, pmhnp, fnp-bc. 1/11/20243:44 PM

## 2022-10-08 NOTE — Plan of Care (Signed)
Nurse discussed coping skills with patient.  

## 2022-10-08 NOTE — Progress Notes (Signed)
D:  Patient denied SI and HI, contracts for safety.  Denied A/V hallucinations.  Denied pain. A:  Medications administered per MD orders.  Emotional support and encouragement given patient. R:  Safety maintained with 15 minute checks.  

## 2022-10-08 NOTE — Progress Notes (Signed)
   10/07/22 2219  Psych Admission Type (Psych Patients Only)  Admission Status Involuntary  Psychosocial Assessment  Patient Complaints Anxiety;Depression  Eye Contact Brief  Facial Expression Flat  Affect Flat  Speech Soft;Logical/coherent  Interaction Isolative  Motor Activity Slow  Appearance/Hygiene Disheveled  Behavior Characteristics Cooperative  Mood Anxious;Depressed  Thought Process  Coherency WDL  Content WDL  Delusions None reported or observed  Perception WDL  Hallucination None reported or observed  Judgment Poor  Confusion None  Danger to Self  Current suicidal ideation? Denies  Agreement Not to Harm Self Yes  Description of Agreement Verbally contracts for safety.  Danger to Others  Danger to Others None reported or observed   Patient alert and oriented. Presenting with a flat affect and anxious, depressed mood. Patient denies SI, HI, AVH, and pain. Patient endorses anxiety and depression 5/10.   PRN hydroxyzine administered to patient, per provider orders. Support and encouragement provided. Routine safety checks conducted every 15 minutes. Patient verbally contracts for safety and remains safe on the unit.

## 2022-10-09 ENCOUNTER — Encounter (HOSPITAL_COMMUNITY): Payer: Self-pay

## 2022-10-09 MED ORDER — TRAZODONE HCL 50 MG PO TABS
50.0000 mg | ORAL_TABLET | Freq: Every day | ORAL | Status: DC
  Administered 2022-10-09 – 2022-10-11 (×3): 50 mg via ORAL
  Filled 2022-10-09 (×6): qty 1

## 2022-10-09 MED ORDER — GABAPENTIN 100 MG PO CAPS
100.0000 mg | ORAL_CAPSULE | Freq: Three times a day (TID) | ORAL | Status: DC
  Administered 2022-10-09 – 2022-10-10 (×3): 100 mg via ORAL
  Filled 2022-10-09 (×12): qty 1

## 2022-10-09 NOTE — Group Note (Signed)
Recreation Therapy Group Note   Group Topic:Stress Management  Group Date: 10/09/2022 Start Time: 0935 End Time: 0950 Facilitators: Fraida Veldman-McCall, LRT,CTRS Location: [address] Dayroom   Goal Area(s) Addresses:  Patient will actively participate in stress management techniques presented during session.  Patient will successfully identify benefit of practicing stress management post d/c.   Group Description: Guided Imagery. LRT provided education, instruction, and demonstration on practice of visualization via guided imagery. Patient was asked to participate in the technique introduced during session. LRT debriefed including topics of mindfulness, stress management and specific scenarios each patient could use these techniques. Patients were given suggestions of ways to access scripts post d/c and encouraged to explore Youtube and other apps available on smartphones, tablets, and computers.   Affect/Mood: N/A   Participation Level: Did not attend    Clinical Observations/Individualized Feedback:     Plan: Continue to engage patient in RT group sessions 2-3x/week.   Ernestina Joe-McCall, LRT,CTRS 10/09/2022 12:00 PM

## 2022-10-09 NOTE — Progress Notes (Signed)
   10/09/22 2200  Psych Admission Type (Psych Patients Only)  Admission Status Involuntary  Psychosocial Assessment  Patient Complaints Insomnia;Anxiety  Eye Contact Fair  Facial Expression Anxious  Affect Anxious  Speech Logical/coherent  Interaction Assertive  Motor Activity Other (Comment) (wnl)  Appearance/Hygiene Unremarkable  Behavior Characteristics Cooperative;Appropriate to situation;Anxious  Mood Anxious  Thought Process  Coherency WDL  Content WDL  Delusions None reported or observed  Perception WDL  Hallucination None reported or observed  Judgment WDL  Confusion None  Danger to Self  Current suicidal ideation? Denies  Agreement Not to Harm Self Yes  Description of Agreement Veebal  Danger to Others  Danger to Others None reported or observed

## 2022-10-09 NOTE — Progress Notes (Addendum)
D: Pt denied SI/HI/AVH this morning. Pt rated her depression a 1/10, anxiety a 1/10, and feelings of hopelessness a 0/10. Pt reports 5/10 back pain related to sleeping on beds here. Pt reports that her goal for today is to attend groups. Pt seen interacting on the unit and participating in groups throughout the day. Pt has been pleasant, calm, and cooperative throughout the shift. Pt signed voluntary form at 1047 per MD order. Pt complained of anxiety/ pain/ restless legs around 1150, PRN hydroxyzine, Ativan, and Tylenol given.   A: RN provided support and encouragement to patient. Pt given scheduled medications as prescribed. PRN Ativan and Hydroxyzine given for anxiety. PRN Tylenol given for pain. Q15 min checks verified for safety.    R: Patient verbally contracts for safety. Patient compliant with medications and treatment plan. Patient is interacting well on the unit. Pt is safe on the unit.   10/09/22 1100  Psych Admission Type (Psych Patients Only)  Admission Status Involuntary  Psychosocial Assessment  Patient Complaints Anxiety;Depression  Eye Contact Fair  Facial Expression Anxious;Sad  Affect Anxious;Depressed  Speech Logical/coherent  Interaction Assertive  Motor Activity Other (Comment) (WDL)  Appearance/Hygiene Unremarkable  Behavior Characteristics Cooperative;Anxious  Mood Depressed  Thought Process  Coherency WDL  Content WDL  Delusions None reported or observed  Perception WDL  Hallucination None reported or observed  Judgment Impaired  Confusion None  Danger to Self  Current suicidal ideation? Denies  Agreement Not to Harm Self Yes  Description of Agreement Verbal  Danger to Others  Danger to Others None reported or observed

## 2022-10-09 NOTE — [facility] Suicide Risk Assessment (Signed)
[place] INPATIENT:  Family/Significant Other Suicide Prevention Education  Suicide Prevention Education:  Education Completed; Rebecca Velasquez [phone],  (name of family member/significant other) has been identified by the patient as the family member/significant other with whom the patient will be residing, and identified as the person(s) who will aid the patient in the event of a mental health crisis (suicidal ideations/suicide attempt).  With written consent from the patient, the family member/significant other has been provided the following suicide prevention education, prior to the and/or following the discharge of the patient.  The suicide prevention education provided includes the following: Suicide risk factors Suicide prevention and interventions National Suicide Hotline telephone number [facility] assessment telephone number [facility] Emergency Assistance [place] and/or Residential Mobile Crisis Unit telephone number  Request made of family/significant other to: Remove weapons (e.g., guns, rifles, knives), all items previously/currently identified as safety concern.   Remove drugs/medications (over-the-counter, prescriptions, illicit drugs), all items previously/currently identified as a safety concern.  The family member/significant other verbalizes understanding of the suicide prevention education information provided.  The family member/significant other agrees to remove the items of safety concern listed above.  CSW spoke with patient sister Rebecca Velasquez to complete safety planning. Rebecca Velasquez states that patient has been exhausted and depressed with relocating to a different [place] location, " it was not what she expected" . 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 Rebecca Velasquez states that her and patient just loss their brother to an overdose and mom died in their arms within the period of 5 years. Rebecca Velasquez states  that her and patient seek therapy together and the day before hospitalization patient went to see her therapist. Into the next day,  patient got into a fight with her sister and then boyfriend; was upset that she tried to take 10 pills but the boyfriend told her to spit them out, was walking in the rain, not answering her phone, and saying she was going to harm herself;  they decided to take her to the hospital after calling therapist during this episode. However, Rebecca Velasquez said that when patient was a teenager she use to cut herself but everything else was new. Furthermore, Rebecca Velasquez confirmed that patient would DC to her house for a few days and she is going to make sure that she goes with her to all her follow up appointments. There are no guns or weapons in home just medication and sister said she will lock away all medications.   Rebecca Velasquez 10/09/2022, 3:16 PM

## 2022-10-09 NOTE — Progress Notes (Signed)
[facility] MD Progress Note  10/09/2022 3:29 PM Rebecca Velasquez  MRN:  [id number]  Reason for admission: 37 year old Caucasian female with hx of ADH, anxiety disorder & major depressive disorder. Admitted from the [facility] ED with complain of worsening symptoms of depression & suicidal ideations with plan to overdose oh er medications. Patient apparently made a statement to her boyfriend that she did not want to live any more.   Daily notes: Rebecca Velasquez is seen in her room this morning. Chart reviewed. The chart findings discussed with the treatment team. She presents alert, oriented & aware of situation. She presents with an improving affect, good eye contact & verbally responsive. She says she is starting to feel okay, and yet adds, "I'm starting to feel anxious now that some patients are getting discharged today. I have gotten to know these patients a little, getting used to them & having genuine discussions/conversations with them, now they are leaving. But I'm happy for them the same time that they have met their goals here. Although I'm starting to feel okay mood wise, I did not sleep well last night. My roommate was part of my problem. She stayed up all night writing stuff, walked around & will try to talk to me as well. Trazodone usually helps me sleep soundly at home. Could you please schedule it for me instead of me asking for it?. I'm taking my medicines. No side effects to report. My goal today if to focus on today, not worry about yesterday or tomorrow". Patient's RN reported to the providers via secure chart that Rebecca Velasquez was complaining of restless leg this afternoon. Her gabapentin 100 mg has been re-instated to help combat this problems. She currently denies any SIHI, AVH, delusional thoughts or paranoia. She does not appear to be responding to any internal stimuli. Will continue current plan of care as already in progress. Reviewed vital signs, stable. See treatment plan below.   Principal  Problem: Major depressive disorder, recurrent severe without psychotic features ([place])  Diagnosis: Principal Problem:   Major depressive disorder, recurrent severe without psychotic features ([place])  Total Time spent with patient:  35 minutes  Past Psychiatric History: See H&P  Past Medical History:  Past Medical History:  Diagnosis Date   Asthma    Celiac disease    Depression    Endometriosis     Past Surgical History:  Procedure Laterality Date   GASTRIC BYPASS OPEN  2016   IVC FILTER INSERTION  2016   Family History:  Family History  Problem Relation Age of Onset   Rheum arthritis Mother    Cancer Mother    Diabetes Father    Cancer Father    Family Psychiatric  History: See H&P  Social History:  Social History   Substance and Sexual Activity  Alcohol Use Never     Social History   Substance and Sexual Activity  Drug Use Yes   Types: Marijuana    Social History   Socioeconomic History   Marital status: Single    Spouse name: Not on file   Number of children: Not on file   Years of education: Not on file   Highest education level: Not on file  Occupational History   Not on file  Tobacco Use   Smoking status: Every Day    Packs/day: 1.00    Types: Cigarettes   Smokeless tobacco: Never   Tobacco comments:    Pt interested in quitting smoking  Vaping Use  Vaping Use: Never used  Substance and Sexual Activity   Alcohol use: Never   Drug use: Yes    Types: Marijuana   Sexual activity: Yes    Birth control/protection: Condom  Other Topics Concern   Not on file  Social History Narrative   Not on file   Social Determinants of Health   Financial Resource Strain: Not on file  Food Insecurity: No Food Insecurity (10/07/2022)   Hunger Vital Sign    Worried About Running Out of Food in the Last Year: Never true    Ran Out of Food in the Last Year: Never true  Transportation Needs: No Transportation Needs (10/07/2022)   PRAPARE - Armed forces logistics/support/administrative officer (Medical): No    Lack of Transportation (Non-Medical): No  Physical Activity: Not on file  Stress: Not on file  Social Connections: Not on file   Additional Social History:   Sleep: Poor  Appetite:  Good  Current Medications: Current Facility-Administered Medications  Medication Dose Route Frequency Provider Last Rate Last Admin   acetaminophen (TYLENOL) tablet 650 mg  650 mg Oral Q6H PRN Onuoha, Josephine C, NP   650 mg at 10/09/22 1144   albuterol (VENTOLIN HFA) 108 (90 Base) MCG/ACT inhaler 2 puff  2 puff Inhalation Q6H PRN Nicholaus Steinke I, NP       alum & mag hydroxide-simeth (MAALOX/MYLANTA) 200-200-20 MG/5ML suspension 30 mL  30 mL Oral Q4H PRN Cleatrice Burke, Josephine C, NP       ARIPiprazole (ABILIFY) tablet 5 mg  5 mg Oral Daily Massengill, Nathan, MD   5 mg at 10/09/22 0837   clonazePAM (KLONOPIN) tablet 0.5 mg  0.5 mg Oral QHS Massengill, Nathan, MD   0.5 mg at 10/08/22 2114   gabapentin (NEURONTIN) capsule 100 mg  100 mg Oral Q8H Massengill, Nathan, MD       hydrOXYzine (ATARAX) tablet 25 mg  25 mg Oral Q6H PRN Lindell Spar I, NP   25 mg at 10/09/22 1144   LORazepam (ATIVAN) tablet 0.5 mg  0.5 mg Oral Q6H PRN Lindell Spar I, NP   0.5 mg at 10/09/22 1144   magnesium hydroxide (MILK OF MAGNESIA) suspension 30 mL  30 mL Oral Daily PRN Charmaine Downs C, NP   30 mL at 10/09/22 4098   nicotine (NICODERM CQ - dosed in mg/24 hours) patch 21 mg  21 mg Transdermal Daily Massengill, Ovid Curd, MD   21 mg at 10/09/22 0839   pantoprazole (PROTONIX) EC tablet 80 mg  80 mg Oral Daily Massengill, Ovid Curd, MD   80 mg at 10/09/22 0837   [START ON 10/10/2022] sertraline (ZOLOFT) tablet 50 mg  50 mg Oral Daily Massengill, Nathan, MD       traZODone (DESYREL) tablet 50 mg  50 mg Oral QHS PRN Onuoha, Chinwendu V, NP       Lab Results: No results found for this or any previous visit (from the past 48 hour(s)).  Blood Alcohol level:  Lab Results  Component Value Date   ETH <10  11/91/4782   Metabolic Disorder Labs: Lab Results  Component Value Date   HGBA1C 5.6 12/03/2021   No results found for: "PROLACTIN" Lab Results  Component Value Date   CHOL 193 12/03/2021   TRIG 97.0 12/03/2021   HDL 45.90 12/03/2021   CHOLHDL 4 12/03/2021   VLDL 19.4 12/03/2021   LDLCALC 128 (H) 12/03/2021   Physical Findings: AIMS:  , ,  ,  ,  CIWA:    COWS:     Musculoskeletal: Strength & Muscle Tone: within normal limits Gait & Station: normal Patient leans: N/A  Psychiatric Specialty Exam:  Presentation  General Appearance:  Casual; Fairly Groomed  Eye Contact: Good  Speech: Clear and Coherent; Normal Rate  Speech Volume: Normal  Handedness: Right   Mood and Affect  Mood: Depressed; Anxious  Affect: Congruent; Depressed; Tearful  Thought Process  Thought Processes: Coherent; Goal Directed; Linear  Descriptions of Associations:Intact  Orientation:Full (Time, Place and Person)  Thought Content:Logical  History of Schizophrenia/Schizoaffective disorder:No  Duration of Psychotic Symptoms:NA Hallucinations:Hallucinations: None  Ideas of Reference:None  Suicidal Thoughts:Suicidal Thoughts: No  Homicidal Thoughts:Homicidal Thoughts: No  Sensorium  Memory: Immediate Good; Recent Good; Remote Good  Judgment: Fair  Insight: Fair  Art therapist  Concentration: Fair  Attention Span: Fair  Recall: Good  Fund of Knowledge: Fair  Language: Good  Psychomotor Activity  Psychomotor Activity: Psychomotor Activity: Normal  Assets  Assets: Communication Skills; Desire for Improvement; Financial Resources/Insurance; Housing; Physical Health; Resilience; Social Support  Sleep  Sleep: Sleep: Poor Number of Hours of Sleep: 3  Physical Exam: Physical Exam Vitals and nursing note reviewed.  HENT:     Head: Normocephalic.     Nose: Nose normal.     Mouth/Throat:     Pharynx: Oropharynx is clear.  Eyes:      Pupils: Pupils are equal, round, and reactive to light.  Cardiovascular:     Rate and Rhythm: Normal rate.     Pulses: Normal pulses.  Pulmonary:     Effort: Pulmonary effort is normal.  Genitourinary:    Comments: Deferred Musculoskeletal:        General: Normal range of motion.     Cervical back: Normal range of motion.     Comments: C/o restless leg. Restarted on gabapentin.  Skin:    General: Skin is warm and dry.  Neurological:     General: No focal deficit present.     Mental Status: She is oriented to person, place, and time.    Review of Systems  Constitutional:  Negative for chills, diaphoresis and fever.  HENT:  Negative for congestion and sore throat.   Eyes:  Negative for blurred vision.  Respiratory:  Negative for cough, shortness of breath and wheezing.   Cardiovascular:  Negative for chest pain and palpitations.  Gastrointestinal:  Negative for abdominal pain, constipation, diarrhea, heartburn, nausea and vomiting.  Genitourinary:  Negative for dysuria.  Musculoskeletal:  Negative for joint pain and myalgias.  Skin:  Negative for itching and rash.  Neurological:  Negative for dizziness, tingling, tremors, sensory change, speech change, focal weakness, seizures, loss of consciousness, weakness and headaches.  Endo/Heme/Allergies:        NKDA  Psychiatric/Behavioral:  Positive for depression and substance abuse (Hx. THC use disorder). Negative for hallucinations, memory loss and suicidal ideas. The patient has insomnia. The patient is not nervous/anxious.    Blood pressure 119/85, pulse 68, temperature 97.9 F (36.6 C), temperature source Oral, resp. rate 18, height 5\' 1"  (1.549 m), weight 79.4 kg, SpO2 100 %. Body mass index is 33.07 kg/m.  Treatment Plan Summary: Daily contact with patient to assess and evaluate symptoms and progress in treatment and Medication management.   Continue inpatient hospitalization.  Will continue today 10/09/2022 plan as below  except where it is noted.   Principal/active diagnoses.  Major depressive disorder, recurrent severe without psychotic features (HCC)  Plan: -Continue Abilify 5 mg po daily  augment Sertraline for depression. -Completed Sertraline 25 mg po daily x 1 (10-09-22). -Continue Sertraline 50 mg po daily for for depression (start 10-10-22). -Changed Trazodone 50 mg po Q hs to routine for insomnia. -Continue Vistaril 25 mg po tid prn for anxiety.  -Continue Klonopin-ODT 0.5 mg po Q hs for anxiety. -Continue Lorazepam 0.5 mg po qid prn for anxiety/withdrawal symptoms. -Continue Nicotine patch 21 mg trans-dermally Q 24 hrs for nicotine withdrawal.  -Re-instated on gabapentin 100 mg po tid for agitation.   Other medical issues.  -Continue albuterol inhaler 2 puffs Q 6 hrs prn for SOB.  -Continue Protonix 40 mg po Q am for GERD.   Other PRNS -Continue Tylenol 650 mg every 6 hours PRN for mild pain -Continue Maalox 30 ml Q 4 hrs PRN for indigestion -Continue MOM 30 ml po Q 6 hrs for constipation   Safety and Monitoring: Voluntary admission to inpatient psychiatric unit for safety, stabilization and treatment Daily contact with patient to assess and evaluate symptoms and progress in treatment Patient's case to be discussed in multi-disciplinary team meeting Observation Level : q15 minute checks Vital signs: q12 hours Precautions: Safety   Discharge Planning: Social work and case management to assist with discharge planning and identification of hospital follow-up needs prior to discharge Estimated LOS: 5-7 days Discharge Concerns: Need to establish a safety plan; Medication compliance and effectiveness Discharge Goals: Return home with outpatient referrals for mental health follow-up including medication management/psychotherapy  Armandina Stammer, NP, pmhnp, fnp-bc 10/09/2022, 3:29 PM

## 2022-10-09 NOTE — BH IP Treatment Plan (Signed)
Interdisciplinary Treatment and Diagnostic Plan Update  10/09/2022 Time of Session: 9562 Rebecca Velasquez MRN: [id number]  Principal Diagnosis: Major depressive disorder, recurrent severe without psychotic features ([place])  Secondary Diagnoses: Principal Problem:   Major depressive disorder, recurrent severe without psychotic features ([place])   Current Medications:  Current Facility-Administered Medications  Medication Dose Route Frequency Provider Last Rate Last Admin   acetaminophen (TYLENOL) tablet 650 mg  650 mg Oral Q6H PRN Charmaine Downs C, NP   650 mg at 10/08/22 0635   albuterol (VENTOLIN HFA) 108 (90 Base) MCG/ACT inhaler 2 puff  2 puff Inhalation Q6H PRN Nwoko, Herbert Pun I, NP       alum & mag hydroxide-simeth (MAALOX/MYLANTA) 200-200-20 MG/5ML suspension 30 mL  30 mL Oral Q4H PRN Cleatrice Burke, Josephine C, NP       ARIPiprazole (ABILIFY) tablet 5 mg  5 mg Oral Daily Massengill, Nathan, MD   5 mg at 10/09/22 0837   clonazePAM (KLONOPIN) tablet 0.5 mg  0.5 mg Oral QHS Massengill, Ovid Curd, MD   0.5 mg at 10/08/22 2114   hydrOXYzine (ATARAX) tablet 25 mg  25 mg Oral Q6H PRN Lindell Spar I, NP   25 mg at 10/08/22 2111   LORazepam (ATIVAN) tablet 0.5 mg  0.5 mg Oral Q6H PRN Lindell Spar I, NP   0.5 mg at 10/08/22 2217   magnesium hydroxide (MILK OF MAGNESIA) suspension 30 mL  30 mL Oral Daily PRN Charmaine Downs C, NP   30 mL at 10/09/22 6962   nicotine (NICODERM CQ - dosed in mg/24 hours) patch 21 mg  21 mg Transdermal Daily Massengill, Ovid Curd, MD   21 mg at 10/09/22 0839   pantoprazole (PROTONIX) EC tablet 80 mg  80 mg Oral Daily Massengill, Ovid Curd, MD   80 mg at 10/09/22 0837   [START ON 10/10/2022] sertraline (ZOLOFT) tablet 50 mg  50 mg Oral Daily Massengill, Ovid Curd, MD       traZODone (DESYREL) tablet 50 mg  50 mg Oral QHS PRN Onuoha, Chinwendu V, NP       PTA Medications: Medications Prior to Admission  Medication Sig Dispense Refill Last Dose   albuterol (PROVENTIL) (2.5 MG/3ML) 0.083%  nebulizer solution Take 3 mLs (2.5 mg total) by nebulization every 6 (six) hours as needed for wheezing or shortness of breath. 150 mL 1    albuterol (VENTOLIN HFA) 108 (90 Base) MCG/ACT inhaler INHALE 1-2 PUFFS BY MOUTH EVERY 6 HOURS AS NEEDED FOR WHEEZE OR SHORTNESS OF BREATH (Patient taking differently: Inhale 2 puffs into the lungs every 6 (six) hours as needed for wheezing or shortness of breath.) 8.5 each 1    amoxicillin-clavulanate (AUGMENTIN) 875-125 MG tablet Take 1 tablet by mouth 2 (two) times daily. (Patient not taking: Reported on 10/07/2022) 20 tablet 0    amphetamine-dextroamphetamine (ADDERALL) 20 MG tablet Take 20 mg by mouth 2 (two) times daily.      benzonatate (TESSALON) 200 MG capsule Take 1 capsule (200 mg total) by mouth 2 (two) times daily as needed for cough. (Patient not taking: Reported on 10/07/2022) 20 capsule 0    clonazePAM (KLONOPIN) 0.5 MG tablet Take 0.5 mg by mouth 2 (two) times daily.      esomeprazole (NEXIUM) 40 MG packet Take 40 mg by mouth daily before breakfast. (Patient not taking: Reported on 10/07/2022) 90 each 3    fluconazole (DIFLUCAN) 150 MG tablet Take 1 tablet (150 mg total) by mouth every three (3) days as needed. (Patient not taking: Reported on 10/07/2022)  2 tablet 0    fluticasone (FLONASE) 50 MCG/ACT nasal spray Place 2 sprays into both nostrils daily. (Patient not taking: Reported on 10/07/2022) 16 g 0    gabapentin (NEURONTIN) 100 MG capsule TAKE 1 CAPSULE (100 MG TOTAL) BY MOUTH THREE TIMES DAILY. (Patient not taking: Reported on 10/07/2022) 90 capsule 3    gabapentin (NEURONTIN) 300 MG capsule Take 300 mg by mouth 3 (three) times daily.      Lumateperone Tosylate (CAPLYTA) 21 MG CAPS Take 1 capsule by mouth daily.      predniSONE (DELTASONE) 50 MG tablet Take 1 tablet daily for 5 days. (Patient not taking: Reported on 10/07/2022) 5 tablet 0    tamsulosin (FLOMAX) 0.4 MG CAPS capsule TAKE 1 CAPSULE BY MOUTH EVERY DAY (Patient not taking: Reported on  10/07/2022) 30 capsule 0    traZODone (DESYREL) 100 MG tablet TAKE 1 TABLET BY MOUTH EVERYDAY AT BEDTIME 90 tablet 1    vortioxetine HBr (TRINTELLIX) 5 MG TABS tablet Take 1 tablet (5 mg total) by mouth daily. (Patient not taking: Reported on 10/07/2022) 14 tablet 0     Patient Stressors: Financial difficulties   Marital or family conflict   Occupational concerns    Patient Strengths: Average or above average intelligence  General fund of knowledge  Supportive family/friends   Treatment Modalities: Medication Management, Group therapy, Case management,  1 to 1 session with clinician, Psychoeducation, Recreational therapy.   Physician Treatment Plan for Primary Diagnosis: Major depressive disorder, recurrent severe without psychotic features (HCC) Long Term Goal(s): Improvement in symptoms so as ready for discharge   Short Term Goals: Ability to identify and develop effective coping behaviors will improve Ability to maintain clinical measurements within normal limits will improve Compliance with prescribed medications will improve Ability to identify triggers associated with substance abuse/mental health issues will improve Ability to identify changes in lifestyle to reduce recurrence of condition will improve Ability to verbalize feelings will improve Ability to disclose and discuss suicidal ideas Ability to demonstrate self-control will improve  Medication Management: Evaluate patient's response, side effects, and tolerance of medication regimen.  Therapeutic Interventions: 1 to 1 sessions, Unit Group sessions and Medication administration.  Evaluation of Outcomes: Progressing  Physician Treatment Plan for Secondary Diagnosis: Principal Problem:   Major depressive disorder, recurrent severe without psychotic features (HCC)  Long Term Goal(s): Improvement in symptoms so as ready for discharge   Short Term Goals: Ability to identify and develop effective coping behaviors will  improve Ability to maintain clinical measurements within normal limits will improve Compliance with prescribed medications will improve Ability to identify triggers associated with substance abuse/mental health issues will improve Ability to identify changes in lifestyle to reduce recurrence of condition will improve Ability to verbalize feelings will improve Ability to disclose and discuss suicidal ideas Ability to demonstrate self-control will improve     Medication Management: Evaluate patient's response, side effects, and tolerance of medication regimen.  Therapeutic Interventions: 1 to 1 sessions, Unit Group sessions and Medication administration.  Evaluation of Outcomes: Progressing   RN Treatment Plan for Primary Diagnosis: Major depressive disorder, recurrent severe without psychotic features (HCC) Long Term Goal(s): Knowledge of disease and therapeutic regimen to maintain health will improve  Short Term Goals: Ability to remain free from injury will improve, Ability to verbalize frustration and anger appropriately will improve, Ability to demonstrate self-control, Ability to participate in decision making will improve, Ability to verbalize feelings will improve, Ability to disclose and discuss suicidal ideas, Ability to  identify and develop effective coping behaviors will improve, and Compliance with prescribed medications will improve  Medication Management: RN will administer medications as ordered by provider, will assess and evaluate patient's response and provide education to patient for prescribed medication. RN will report any adverse and/or side effects to prescribing provider.  Therapeutic Interventions: 1 on 1 counseling sessions, Psychoeducation, Medication administration, Evaluate responses to treatment, Monitor vital signs and CBGs as ordered, Perform/monitor CIWA, COWS, AIMS and Fall Risk screenings as ordered, Perform wound care treatments as ordered.  Evaluation of  Outcomes: Progressing   LCSW Treatment Plan for Primary Diagnosis: Major depressive disorder, recurrent severe without psychotic features ([place]) Long Term Goal(s): Safe transition to appropriate next level of care at discharge, Engage patient in therapeutic group addressing interpersonal concerns.  Short Term Goals: Engage patient in aftercare planning with referrals and resources, Increase social support, Increase ability to appropriately verbalize feelings, Increase emotional regulation, Facilitate acceptance of mental health diagnosis and concerns, Facilitate patient progression through stages of change regarding substance use diagnoses and concerns, Identify triggers associated with mental health/substance abuse issues, and Increase skills for wellness and recovery  Therapeutic Interventions: Assess for all discharge needs, 1 to 1 time with Social worker, Explore available resources and support systems, Assess for adequacy in community support network, Educate family and significant other(s) on suicide prevention, Complete Psychosocial Assessment, Interpersonal group therapy.  Evaluation of Outcomes: Progressing   Progress in Treatment: Attending groups: Yes. Participating in groups: Yes. Taking medication as prescribed: Yes. Toleration medication: Yes. Family/Significant other contact made: No, will contact:  -Romonda Parker [phone] Patient understands diagnosis: Yes. Discussing patient identified problems/goals with staff: Yes. Medical problems stabilized or resolved: Yes. Denies suicidal/homicidal ideation: Yes. Issues/concerns per patient self-inventory: Yes. Other:   New problem(s) identified: No, Describe:  None Reported  New Short Term/Long Term Goal(s):  Patient Goals: Coping Skills   Discharge Plan or Barriers: None Reported  Reason for Continuation of Hospitalization: Anxiety Depression Medication stabilization Suicidal ideation  Estimated Length of Stay: 3-7  Days  Last [place] Suicide Severity Risk Score: [place] Admission (Current) from 10/07/2022 in [facility] Most recent reading at 10/07/2022  1:10 PM ED from 10/07/2022 in [facility] [facility] Most recent reading at 10/07/2022  1:02 AM ED from 07/13/2022 in [facility] [place] Most recent reading at 07/13/2022  8:01 PM  C-SSRS RISK CATEGORY No Risk High Risk No Risk       Last PHQ 2/9 Scores:    08/03/2022   10:05 AM 06/03/2022    7:29 AM 04/29/2022   11:00 AM  Depression screen PHQ 2/9  Decreased Interest 0 0 2  Down, Depressed, Hopeless 0 0 2  PHQ - 2 Score 0 0 4  Altered sleeping  0 2  Tired, decreased energy  0 3  Change in appetite  0 3  Feeling bad or failure about yourself   0 3  Trouble concentrating  0 3  Moving slowly or fidgety/restless  0 2  Suicidal thoughts  0 0  PHQ-9 Score  0 20  Difficult doing work/chores  Not difficult at all Very difficult    medication stabilization, elimination of SI thoughts, development of comprehensive mental wellness plan.    Scribe for Treatment Team: Windle Guard, LCSW 10/09/2022 11:21 AM

## 2022-10-09 NOTE — Progress Notes (Signed)
Patient presents to the medication window and reports "its been awhile since I went to poop, do you have something that can help me use the bathroom?"

## 2022-10-09 NOTE — Progress Notes (Signed)
   10/08/22 2300  Psych Admission Type (Psych Patients Only)  Admission Status Involuntary  Psychosocial Assessment  Patient Complaints Anxiety;Depression  Eye Contact Brief  Facial Expression Anxious  Affect Anxious;Depressed  Speech Logical/coherent  Interaction Isolative  Motor Activity Slow  Appearance/Hygiene Unremarkable  Behavior Characteristics Anxious;Cooperative  Mood Depressed  Thought Process  Coherency WDL  Content WDL  Delusions None reported or observed  Perception WDL  Hallucination None reported or observed  Judgment Poor  Confusion None  Danger to Self  Current suicidal ideation? Denies  Agreement Not to Harm Self Yes  Description of Agreement Verbal  Danger to Others  Danger to Others None reported or observed

## 2022-10-09 NOTE — [facility] Group Notes (Signed)
[place] Group Notes:  (Nursing/MHT/Case Management/Adjunct)  Date:  10/09/2022  Time:  9:47 AM  Group Topic/Focus:  Goals Group:   The focus of this group is to help patients establish daily goals to achieve during treatment and discuss how the patient can incorporate goal setting into their daily lives to aide in recovery.  Participation Level:  Active  Participation Quality:  Appropriate  Affect:  Appropriate  Cognitive:  Appropriate  Insight:  Appropriate  Engagement in Group:  Engaged  Modes of Intervention:  Discussion   Summary of Progress/Problems: Patient goal of the day is to stay positive, not get overwhelmed and work on getting back home to friends and family.   Alric Seton 10/09/2022, 9:47 AM

## 2022-10-10 MED ORDER — GABAPENTIN 300 MG PO CAPS
300.0000 mg | ORAL_CAPSULE | Freq: Three times a day (TID) | ORAL | Status: DC
  Administered 2022-10-10 – 2022-10-12 (×6): 300 mg via ORAL
  Filled 2022-10-10 (×15): qty 1

## 2022-10-10 NOTE — Progress Notes (Signed)
   10/10/22 0531  15 Minute Checks  Location Bedroom  Visual Appearance Calm  Behavior Sleeping  Sleep (Behavioral Health Patients Only)  Calculate sleep? (Click Yes once per 24 hr at 0600 safety check) Yes  Documented sleep last 24 hours 5.25

## 2022-10-10 NOTE — Progress Notes (Signed)
Adult Psychoeducational Group Note  Date:  10/10/2022 Time:  8:30 PM  Group Topic/Focus:  Wrap-Up Group:   The focus of this group is to help patients review their daily goal of treatment and discuss progress on daily workbooks.  Participation Level:  Active  Participation Quality:  Appropriate  Affect:  Appropriate and Blunted  Cognitive:  Appropriate  Insight: Appropriate  Engagement in Group:  Engaged  Modes of Intervention:  Discussion  Additional Comments:  Pt stated she had a good day.  Pt stated her goal for the day was to be positive. Pt met goal.  Elise Benne 10/10/2022, 8:30 PM

## 2022-10-10 NOTE — Progress Notes (Signed)
[facility] MD Progress Note  10/10/2022 12:33 PM Janete Quilling  MRN:  [id number]  Reason for admission: 37 year old Caucasian female with hx of ADH, anxiety disorder & major depressive disorder. Admitted from the [facility] ED with complain of worsening symptoms of depression & suicidal ideations with plan to overdose oh er medications. Patient apparently made a statement to her boyfriend that she did not want to live any more.   Daily notes: Neila is seen in her room this morning. Chart reviewed. The chart findings discussed with the treatment team. She presents alert, oriented & aware of situation. She presents with an improving affect, good eye contact & verbally responsive. She reports, "My mood is improving. I slept better last night. My sister visited me last evening. It was a good visit. She expressed to me that she can tell that I have improved & look better. I'm starting to feel like myself again. I have called & talked to my outpatient psychiatrist to let him know that I'm in the hospital. He said once I get out of the hospital, that he will see me right away for follow-up. The group sessions here has been great. I'm learning a lot of coping skills that will help me after discharge. After discharge, will go to my sister's house to stay for a while. I'm taking my medicines, no side effects to reports. I just need my gabapentin dose to be increased a little". Kellina currently denies any SIHI, AVH, delusional thoughts or paranoia. She does not appear to be responding to any internal stimuli. Reviewed current lab results & vital signs, all are stable. Will continue current plan of care as already in progress.   Principal Problem: Major depressive disorder, recurrent severe without psychotic features (HCC)  Diagnosis: Principal Problem:   Major depressive disorder, recurrent severe without psychotic features (HCC)  Total Time spent with patient:  35 minutes  Past Psychiatric History: See  H&P  Past Medical History:  Past Medical History:  Diagnosis Date   Asthma    Celiac disease    Depression    Endometriosis     Past Surgical History:  Procedure Laterality Date   GASTRIC BYPASS OPEN  2016   IVC FILTER INSERTION  2016   Family History:  Family History  Problem Relation Age of Onset   Rheum arthritis Mother    Cancer Mother    Diabetes Father    Cancer Father    Family Psychiatric  History: See H&P  Social History:  Social History   Substance and Sexual Activity  Alcohol Use Never     Social History   Substance and Sexual Activity  Drug Use Yes   Types: Marijuana    Social History   Socioeconomic History   Marital status: Single    Spouse name: Not on file   Number of children: Not on file   Years of education: Not on file   Highest education level: Not on file  Occupational History   Not on file  Tobacco Use   Smoking status: Every Day    Packs/day: 1.00    Types: Cigarettes   Smokeless tobacco: Never   Tobacco comments:    Pt interested in quitting smoking  Vaping Use   Vaping Use: Never used  Substance and Sexual Activity   Alcohol use: Never   Drug use: Yes    Types: Marijuana   Sexual activity: Yes    Birth control/protection: Condom  Other Topics Concern  Not on file  Social History Narrative   Not on file   Social Determinants of Health   Financial Resource Strain: Not on file  Food Insecurity: No Food Insecurity (10/07/2022)   Hunger Vital Sign    Worried About Running Out of Food in the Last Year: Never true    Ran Out of Food in the Last Year: Never true  Transportation Needs: No Transportation Needs (10/07/2022)   PRAPARE - Administrator, Civil Service (Medical): No    Lack of Transportation (Non-Medical): No  Physical Activity: Not on file  Stress: Not on file  Social Connections: Not on file   Additional Social History:   Sleep: Poor  Appetite:  Good  Current Medications: Current  Facility-Administered Medications  Medication Dose Route Frequency Provider Last Rate Last Admin   acetaminophen (TYLENOL) tablet 650 mg  650 mg Oral Q6H PRN Welford Roche, Josephine C, NP   650 mg at 10/10/22 1158   albuterol (VENTOLIN HFA) 108 (90 Base) MCG/ACT inhaler 2 puff  2 puff Inhalation Q6H PRN Valina Maes, Nicole Kindred I, NP       alum & mag hydroxide-simeth (MAALOX/MYLANTA) 200-200-20 MG/5ML suspension 30 mL  30 mL Oral Q4H PRN Welford Roche, Josephine C, NP       ARIPiprazole (ABILIFY) tablet 5 mg  5 mg Oral Daily Massengill, Nathan, MD   5 mg at 10/10/22 0755   clonazePAM (KLONOPIN) tablet 0.5 mg  0.5 mg Oral QHS Massengill, Nathan, MD   0.5 mg at 10/09/22 2132   gabapentin (NEURONTIN) capsule 100 mg  100 mg Oral Q8H Massengill, Nathan, MD   100 mg at 10/10/22 4627   hydrOXYzine (ATARAX) tablet 25 mg  25 mg Oral Q6H PRN Armandina Stammer I, NP   25 mg at 10/09/22 2309   LORazepam (ATIVAN) tablet 0.5 mg  0.5 mg Oral Q6H PRN Armandina Stammer I, NP   0.5 mg at 10/09/22 2309   magnesium hydroxide (MILK OF MAGNESIA) suspension 30 mL  30 mL Oral Daily PRN Dahlia Byes C, NP   30 mL at 10/09/22 0350   nicotine (NICODERM CQ - dosed in mg/24 hours) patch 21 mg  21 mg Transdermal Daily Massengill, Nathan, MD   21 mg at 10/10/22 0756   pantoprazole (PROTONIX) EC tablet 80 mg  80 mg Oral Daily Massengill, Harrold Donath, MD   80 mg at 10/10/22 0755   sertraline (ZOLOFT) tablet 50 mg  50 mg Oral Daily Massengill, Harrold Donath, MD   50 mg at 10/10/22 0755   traZODone (DESYREL) tablet 50 mg  50 mg Oral QHS Armandina Stammer I, NP   50 mg at 10/09/22 2130   Lab Results: No results found for this or any previous visit (from the past 48 hour(s)).  Blood Alcohol level:  Lab Results  Component Value Date   ETH <10 10/07/2022   Metabolic Disorder Labs: Lab Results  Component Value Date   HGBA1C 5.6 12/03/2021   No results found for: "PROLACTIN" Lab Results  Component Value Date   CHOL 193 12/03/2021   TRIG 97.0 12/03/2021   HDL 45.90  12/03/2021   CHOLHDL 4 12/03/2021   VLDL 19.4 12/03/2021   LDLCALC 128 (H) 12/03/2021   Physical Findings: AIMS:  , ,  ,  ,    CIWA:    COWS:     Musculoskeletal: Strength & Muscle Tone: within normal limits Gait & Station: normal Patient leans: N/A  Psychiatric Specialty Exam:  Presentation  General Appearance:  Appropriate for  Environment; Casual; Fairly Groomed  Eye Contact: Good  Speech: Clear and Coherent; Normal Rate  Speech Volume: Normal  Handedness: Right   Mood and Affect  Mood: -- (Improving)  Affect: Congruent  Thought Process  Thought Processes: Coherent; Goal Directed; Linear  Descriptions of Associations:Intact  Orientation:Full (Time, Place and Person)  Thought Content:Logical  History of Schizophrenia/Schizoaffective disorder:No  Duration of Psychotic Symptoms:NA Hallucinations:Hallucinations: None   Ideas of Reference:None  Suicidal Thoughts:Suicidal Thoughts: No   Homicidal Thoughts:Homicidal Thoughts: No   Sensorium  Memory: Immediate Good; Recent Good; Remote Good  Judgment: Good  Insight: Good  Executive Functions  Concentration: Good  Attention Span: Good  Recall: Good  Fund of Knowledge: Good  Language: Good  Psychomotor Activity  Psychomotor Activity: Psychomotor Activity: Normal  Assets  Assets: Communication Skills; Desire for Improvement; Financial Resources/Insurance; Housing; Physical Health; Resilience; Social Support  Sleep  Sleep: Sleep: Good Number of Hours of Sleep: 7  Physical Exam: Physical Exam Vitals and nursing note reviewed.  HENT:     Head: Normocephalic.     Nose: Nose normal.     Mouth/Throat:     Pharynx: Oropharynx is clear.  Eyes:     Pupils: Pupils are equal, round, and reactive to light.  Cardiovascular:     Rate and Rhythm: Normal rate.     Pulses: Normal pulses.  Pulmonary:     Effort: Pulmonary effort is normal.  Genitourinary:    Comments:  Deferred Musculoskeletal:        General: Normal range of motion.     Cervical back: Normal range of motion.     Comments: C/o restless leg. Restarted on gabapentin.  Skin:    General: Skin is warm and dry.  Neurological:     General: No focal deficit present.     Mental Status: She is oriented to person, place, and time.    Review of Systems  Constitutional:  Negative for chills, diaphoresis and fever.  HENT:  Negative for congestion and sore throat.   Eyes:  Negative for blurred vision.  Respiratory:  Negative for cough, shortness of breath and wheezing.   Cardiovascular:  Negative for chest pain and palpitations.  Gastrointestinal:  Negative for abdominal pain, constipation, diarrhea, heartburn, nausea and vomiting.  Genitourinary:  Negative for dysuria.  Musculoskeletal:  Negative for joint pain and myalgias.  Skin:  Negative for itching and rash.  Neurological:  Negative for dizziness, tingling, tremors, sensory change, speech change, focal weakness, seizures, loss of consciousness, weakness and headaches.  Endo/Heme/Allergies:        NKDA  Psychiatric/Behavioral:  Positive for depression and substance abuse (Hx. THC use disorder). Negative for hallucinations, memory loss and suicidal ideas. The patient has insomnia. The patient is not nervous/anxious.    Blood pressure 125/87, pulse 80, temperature 98.9 F (37.2 C), temperature source Oral, resp. rate 14, height 5\' 1"  (1.549 m), weight 79.4 kg, SpO2 100 %. Body mass index is 33.07 kg/m.  Treatment Plan Summary: Daily contact with patient to assess and evaluate symptoms and progress in treatment and Medication management.   Continue inpatient hospitalization.  Will continue today 10/10/2022 plan as below except where it is noted.   Principal/active diagnoses.  Major depressive disorder, recurrent severe without psychotic features ([place])  Plan: -Continue Abilify 5 mg po daily augment Sertraline for depression. -Completed  Sertraline 25 mg po daily x 1 (10-09-22). -Continue Sertraline 50 mg po daily for for depression (start 10-10-22). -Continue Trazodone 50 mg po Q hs for  insomnia. -Continue Vistaril 25 mg po tid prn for anxiety.  -Continue Klonopin-ODT 0.5 mg po Q hs for anxiety. -Continue Lorazepam 0.5 mg po qid prn for anxiety/withdrawal symptoms. -Continue Nicotine patch 21 mg trans-dermally Q 24 hrs for nicotine withdrawal.  -Increased gabapentin from 100 mg to 300 po tid for agitation.   Other medical issues.  -Continue albuterol inhaler 2 puffs Q 6 hrs prn for SOB.  -Continue Protonix 40 mg po Q am for GERD.   Other PRNS -Continue Tylenol 650 mg every 6 hours PRN for mild pain -Continue Maalox 30 ml Q 4 hrs PRN for indigestion -Continue MOM 30 ml po Q 6 hrs for constipation   Safety and Monitoring: Voluntary admission to inpatient psychiatric unit for safety, stabilization and treatment Daily contact with patient to assess and evaluate symptoms and progress in treatment Patient's case to be discussed in multi-disciplinary team meeting Observation Level : q15 minute checks Vital signs: q12 hours Precautions: Safety   Discharge Planning: Social work and case management to assist with discharge planning and identification of hospital follow-up needs prior to discharge Estimated LOS: 5-7 days Discharge Concerns: Need to establish a safety plan; Medication compliance and effectiveness Discharge Goals: Return home with outpatient referrals for mental health follow-up including medication management/psychotherapy  Lindell Spar, NP, pmhnp, fnp-bc 10/10/2022, 12:33 PMPatient ID: [name], female   DOB: 01-19-1986, 37 y.o.   MRN: [id number]

## 2022-10-10 NOTE — Progress Notes (Addendum)
D: Pt denied SI/HI/AVH this morning. Pt denied feelings of anxiety or depression. Pt has been pleasant, calm, and cooperative throughout the shift. Pt seen interacting on the unit throughout the day and participating in groups.   A: RN provided support and encouragement to patient. Pt given scheduled medications as prescribed. Q15 min checks verified for safety.    R: Patient verbally contracts for safety. Patient compliant with medications and treatment plan. Patient is interacting well on the unit. Pt is safe on the unit.   10/10/22 0900  Psych Admission Type (Psych Patients Only)  Admission Status Voluntary  Psychosocial Assessment  Patient Complaints None  Eye Contact Fair  Facial Expression Anxious  Affect Anxious  Speech Logical/coherent  Interaction Assertive  Motor Activity Other (Comment) (WDL)  Behavior Characteristics Cooperative;Appropriate to situation  Mood Anxious;Pleasant  Thought Process  Coherency WDL  Content WDL  Delusions None reported or observed  Perception WDL  Hallucination None reported or observed  Judgment WDL  Confusion None  Danger to Self  Current suicidal ideation? Denies  Agreement Not to Harm Self Yes  Description of Agreement Verbal  Danger to Others  Danger to Others None reported or observed

## 2022-10-10 NOTE — [facility] Group Notes (Signed)
.  Psychoeducational Group Note    Date:  10/10/2022 Time:1300-1400    Purpose of Group: . The group focus' on teaching patients on how to identify their needs and their Life Skills:  A group where two lists are made. What people need and what are things that we do that are unhealthy. The lists are developed by the patients and it is explained that we often do the actions that are not healthy to get our list of needs met.  Goal:: to develop the coping skills needed to get their needs met  Participation Level:  Did not attend  Additional Comments:    [name]

## 2022-10-10 NOTE — Progress Notes (Signed)
   10/10/22 2112  Psych Admission Type (Psych Patients Only)  Admission Status Voluntary  Psychosocial Assessment  Patient Complaints Insomnia  Eye Contact Fair  Facial Expression Anxious  Affect Anxious;Appropriate to circumstance  Speech Logical/coherent  Interaction Assertive  Motor Activity Other (Comment) (WDL)  Appearance/Hygiene Unremarkable  Behavior Characteristics Cooperative;Appropriate to situation;Anxious  Mood Anxious;Pleasant  Thought Process  Coherency WDL  Content Blaming self  Delusions None reported or observed  Perception WDL  Hallucination None reported or observed  Judgment WDL  Confusion None  Danger to Self  Current suicidal ideation? Denies  Agreement Not to Harm Self Yes  Description of Agreement verbally contracts for safety  Danger to Others  Danger to Others None reported or observed

## 2022-10-11 DIAGNOSIS — F332 Major depressive disorder, recurrent severe without psychotic features: Principal | ICD-10-CM

## 2022-10-11 NOTE — Group Note (Signed)
LCSW Group Therapy Note   Group Date: 10/11/2022 Start Time: 1000 End Time: 1100  Type of Therapy and Topic:  Group Therapy: Are You Under Stress?!?!  Participation Level:  Active  Description of Group: This process group involved patients examining stress and how it impacts their lives. Distress and eustress definitions were explained and patients identified both good and bad stressors in their lives. Accompanying worksheet was used to help patients identify the physical and emotional symptoms of stress and techniques/copings skills they utilize to help reduce these symptoms.    Therapeutic Goals:  1.  Patients will talk about their experiences with distress and eustress. 2. Patients will identify stressors in their lives and the physical and emotional  symptoms they experience while under stress. 3. Patients to identify actions/coping skills that they utilize to help ameliorate  symptoms of stress.  Summary of Patient Progress:    Patient was present and active in group. Patient followed along with provided worksheet. Patient had positive insight of self-care. Patient gave good examples on what she needed to work on pertaining to her self-care. Patient was respectful of her peers and remain in group the entire time.   Therapeutic Modalities:  Cognitive Behavioral Therapy Solution-Focused Therapy   Charlett Lango 10/11/2022  12:58 PM

## 2022-10-11 NOTE — Progress Notes (Signed)
[facility] MD Progress Note  10/11/2022 8:37 AM Rebecca Velasquez  MRN:  [id number]  Reason for admission: 37 year old Caucasian female with hx of ADH, anxiety disorder & major depressive disorder. Admitted from the [facility] ED with complain of worsening symptoms of depression & suicidal ideations with plan to overdose oh er medications. Patient apparently made a statement to her boyfriend that she did not want to live any more.   Daily notes: Rebecca Velasquez is seen in her room this morning. Chart reviewed. The chart findings discussed with the treatment team. She presents alert, oriented & aware of situation. She presents with a much improved affect, good eye contact & verbally responsive. She reports, "I'm doing great. Everything is going well. My mood is good. I woke-up this morning feeling excited. I have no complaints. The group sessions has been amazing. I have learned so much. I will be going to my sister's house after discharge. I have no complaints today". Rebecca Velasquez denies any SIHI, AVH, delusional thoughts or paranoia. She does not appear to be responding to any internal stimuli. She continues to do well on her medications. Denies any side effects. If  continues to do well by tomorrow morning, should be discharged. Says will be following-up with her outpatient provider after discharge.   Principal Problem: Major depressive disorder, recurrent severe without psychotic features (HCC)  Diagnosis: Principal Problem:   Major depressive disorder, recurrent severe without psychotic features (HCC)  Total Time spent with patient:  35 minutes  Past Psychiatric History: See H&P  Past Medical History:  Past Medical History:  Diagnosis Date   Asthma    Celiac disease    Depression    Endometriosis     Past Surgical History:  Procedure Laterality Date   GASTRIC BYPASS OPEN  2016   IVC FILTER INSERTION  2016   Family History:  Family History  Problem Relation Age of Onset   Rheum  arthritis Mother    Cancer Mother    Diabetes Father    Cancer Father    Family Psychiatric  History: See H&P  Social History:  Social History   Substance and Sexual Activity  Alcohol Use Never     Social History   Substance and Sexual Activity  Drug Use Yes   Types: Marijuana    Social History   Socioeconomic History   Marital status: Single    Spouse name: Not on file   Number of children: Not on file   Years of education: Not on file   Highest education level: Not on file  Occupational History   Not on file  Tobacco Use   Smoking status: Every Day    Packs/day: 1.00    Types: Cigarettes   Smokeless tobacco: Never   Tobacco comments:    Pt interested in quitting smoking  Vaping Use   Vaping Use: Never used  Substance and Sexual Activity   Alcohol use: Never   Drug use: Yes    Types: Marijuana   Sexual activity: Yes    Birth control/protection: Condom  Other Topics Concern   Not on file  Social History Narrative   Not on file   Social Determinants of Health   Financial Resource Strain: Not on file  Food Insecurity: No Food Insecurity (10/07/2022)   Hunger Vital Sign    Worried About Running Out of Food in the Last Year: Never true    Ran Out of Food in the Last Year: Never true  Transportation Needs: No  Transportation Needs (10/07/2022)   PRAPARE - Hydrologist (Medical): No    Lack of Transportation (Non-Medical): No  Physical Activity: Not on file  Stress: Not on file  Social Connections: Not on file   Additional Social History:   Sleep: Poor  Appetite:  Good  Current Medications: Current Facility-Administered Medications  Medication Dose Route Frequency Provider Last Rate Last Admin   acetaminophen (TYLENOL) tablet 650 mg  650 mg Oral Q6H PRN Charmaine Downs C, NP   650 mg at 10/10/22 2116   albuterol (VENTOLIN HFA) 108 (90 Base) MCG/ACT inhaler 2 puff  2 puff Inhalation Q6H PRN Albie Arizpe, Herbert Pun I, NP       alum &  mag hydroxide-simeth (MAALOX/MYLANTA) 200-200-20 MG/5ML suspension 30 mL  30 mL Oral Q4H PRN Cleatrice Burke, Josephine C, NP       ARIPiprazole (ABILIFY) tablet 5 mg  5 mg Oral Daily Massengill, Nathan, MD   5 mg at 10/11/22 0803   clonazePAM (KLONOPIN) tablet 0.5 mg  0.5 mg Oral QHS Massengill, Nathan, MD   0.5 mg at 10/10/22 2113   gabapentin (NEURONTIN) capsule 300 mg  300 mg Oral Q8H Myrian Botello I, NP   300 mg at 10/11/22 9371   hydrOXYzine (ATARAX) tablet 25 mg  25 mg Oral Q6H PRN Lindell Spar I, NP   25 mg at 10/11/22 0809   LORazepam (ATIVAN) tablet 0.5 mg  0.5 mg Oral Q6H PRN Lindell Spar I, NP   0.5 mg at 10/09/22 2309   magnesium hydroxide (MILK OF MAGNESIA) suspension 30 mL  30 mL Oral Daily PRN Charmaine Downs C, NP   30 mL at 10/09/22 6967   nicotine (NICODERM CQ - dosed in mg/24 hours) patch 21 mg  21 mg Transdermal Daily Massengill, Nathan, MD   21 mg at 10/11/22 0801   pantoprazole (PROTONIX) EC tablet 80 mg  80 mg Oral Daily Massengill, Ovid Curd, MD   80 mg at 10/11/22 0801   sertraline (ZOLOFT) tablet 50 mg  50 mg Oral Daily Massengill, Ovid Curd, MD   50 mg at 10/11/22 0801   traZODone (DESYREL) tablet 50 mg  50 mg Oral QHS Lindell Spar I, NP   50 mg at 10/10/22 2112   Lab Results: No results found for this or any previous visit (from the past 48 hour(s)).  Blood Alcohol level:  Lab Results  Component Value Date   ETH <10 89/38/1017   Metabolic Disorder Labs: Lab Results  Component Value Date   HGBA1C 5.6 12/03/2021   No results found for: "PROLACTIN" Lab Results  Component Value Date   CHOL 193 12/03/2021   TRIG 97.0 12/03/2021   HDL 45.90 12/03/2021   CHOLHDL 4 12/03/2021   VLDL 19.4 12/03/2021   LDLCALC 128 (H) 12/03/2021   Physical Findings: AIMS:  , ,  ,  ,    CIWA:    COWS:     Musculoskeletal: Strength & Muscle Tone: within normal limits Gait & Station: normal Patient leans: N/A  Psychiatric Specialty Exam:  Presentation  General Appearance:   Appropriate for Environment; Casual; Fairly Groomed  Eye Contact: Good  Speech: Clear and Coherent; Normal Rate  Speech Volume: Normal  Handedness: Right   Mood and Affect  Mood: -- (Improving)  Affect: Congruent  Thought Process  Thought Processes: Coherent; Goal Directed; Linear  Descriptions of Associations:Intact  Orientation:Full (Time, Place and Person)  Thought Content:Logical  History of Schizophrenia/Schizoaffective disorder:No  Duration of Psychotic Symptoms:NA Hallucinations:Hallucinations: None  Ideas of Reference:None  Suicidal Thoughts:Suicidal Thoughts: No   Homicidal Thoughts:Homicidal Thoughts: No   Sensorium  Memory: Immediate Good; Recent Good; Remote Good  Judgment: Good  Insight: Good  Executive Functions  Concentration: Good  Attention Span: Good  Recall: Good  Fund of Knowledge: Good  Language: Good  Psychomotor Activity  Psychomotor Activity: Psychomotor Activity: Normal  Assets  Assets: Communication Skills; Desire for Improvement; Financial Resources/Insurance; Housing; Physical Health; Resilience; Social Support  Sleep  Sleep: Sleep: Good Number of Hours of Sleep: 7  Physical Exam: Physical Exam Vitals and nursing note reviewed.  HENT:     Head: Normocephalic.     Nose: Nose normal.     Mouth/Throat:     Pharynx: Oropharynx is clear.  Eyes:     Pupils: Pupils are equal, round, and reactive to light.  Cardiovascular:     Rate and Rhythm: Normal rate.     Pulses: Normal pulses.  Pulmonary:     Effort: Pulmonary effort is normal.  Genitourinary:    Comments: Deferred Musculoskeletal:        General: Normal range of motion.     Cervical back: Normal range of motion.     Comments: C/o restless leg. Restarted on gabapentin.  Skin:    General: Skin is warm and dry.  Neurological:     General: No focal deficit present.     Mental Status: She is oriented to person, place, and time.     Review of Systems  Constitutional:  Negative for chills, diaphoresis and fever.  HENT:  Negative for congestion and sore throat.   Eyes:  Negative for blurred vision.  Respiratory:  Negative for cough, shortness of breath and wheezing.   Cardiovascular:  Negative for chest pain and palpitations.  Gastrointestinal:  Negative for abdominal pain, constipation, diarrhea, heartburn, nausea and vomiting.  Genitourinary:  Negative for dysuria.  Musculoskeletal:  Negative for joint pain and myalgias.  Skin:  Negative for itching and rash.  Neurological:  Negative for dizziness, tingling, tremors, sensory change, speech change, focal weakness, seizures, loss of consciousness, weakness and headaches.  Endo/Heme/Allergies:        NKDA  Psychiatric/Behavioral:  Positive for depression and substance abuse (Hx. THC use disorder). Negative for hallucinations, memory loss and suicidal ideas. The patient has insomnia. The patient is not nervous/anxious.    Blood pressure (!) 145/97, pulse 76, temperature 97.9 F (36.6 C), temperature source Oral, resp. rate 16, height 5\' 1"  (1.549 m), weight 79.4 kg, SpO2 100 %. Body mass index is 33.07 kg/m.  Treatment Plan Summary: Daily contact with patient to assess and evaluate symptoms and progress in treatment and Medication management.   Continue inpatient hospitalization.  Will continue today 10/11/2022 plan as below except where it is noted.   Principal/active diagnoses.  Major depressive disorder, recurrent severe without psychotic features (HCC)  Plan: -Continue Abilify 5 mg po daily augment Sertraline for depression. -Completed Sertraline 25 mg po daily x 1 (10-09-22). -Continue Sertraline 50 mg po daily for for depression (start 10-10-22). -Continue Trazodone 50 mg po Q hs for insomnia. -Continue Vistaril 25 mg po tid prn for anxiety.  -Continue Klonopin-ODT 0.5 mg po Q hs for anxiety. -Continue Lorazepam 0.5 mg po qid prn for anxiety/withdrawal  symptoms. -Continue Nicotine patch 21 mg trans-dermally Q 24 hrs for nicotine withdrawal.  -Continue gabapentin 300 po tid for agitation.   Other medical issues.  -Continue albuterol inhaler 2 puffs Q 6 hrs prn for SOB.  -Continue Protonix  40 mg po Q am for GERD.   Other PRNS -Continue Tylenol 650 mg every 6 hours PRN for mild pain -Continue Maalox 30 ml Q 4 hrs PRN for indigestion -Continue MOM 30 ml po Q 6 hrs for constipation   Safety and Monitoring: Voluntary admission to inpatient psychiatric unit for safety, stabilization and treatment Daily contact with patient to assess and evaluate symptoms and progress in treatment Patient's case to be discussed in multi-disciplinary team meeting Observation Level : q15 minute checks Vital signs: q12 hours Precautions: Safety   Discharge Planning: Social work and case management to assist with discharge planning and identification of hospital follow-up needs prior to discharge Estimated LOS: 5-7 days Discharge Concerns: Need to establish a safety plan; Medication compliance and effectiveness Discharge Goals: Return home with outpatient referrals for mental health follow-up including medication management/psychotherapy  Lindell Spar, NP, pmhnp, fnp-bc 10/11/2022, 8:37 AMPatient ID: [name], female   DOB: 06/16/1986, 37 y.o.   MRN: [id number] Patient ID: Alvera Tourigny, female   DOB: 18-Sep-1986, 37 y.o.   MRN: [id number]

## 2022-10-11 NOTE — Plan of Care (Signed)
Nurse discussed coping skills with patient.  

## 2022-10-11 NOTE — Plan of Care (Signed)
  Problem: Education: Goal: Emotional status will improve Outcome: Progressing Goal: Mental status will improve Outcome: Progressing   Problem: Activity: Goal: Interest or engagement in activities will improve Outcome: Progressing   Problem: Health Behavior/Discharge Planning: Goal: Compliance with treatment plan for underlying cause of condition will improve Outcome: Progressing   Problem: Education: Goal: Knowledge of the prescribed therapeutic regimen will improve Outcome: Progressing

## 2022-10-11 NOTE — BHH Group Notes (Signed)
[facility] Group Notes:  (Nursing/MHT/Case Management/Adjunct)  Date:  10/11/2022  Time:  9:15 PM  Type of Therapy:  Group Therapy  Participation Level:  Active  Participation Quality:  Attentive  Affect:  Excited  Cognitive:  Appropriate  Insight:  Good  Engagement in Group:  Engaged  Modes of Intervention:  Discussion  Summary of Progress/Problems:ENJOYING SUPPORT FROM OTHERS AROUND HER. GLAD TO BE GOING HOME.  Floyde Parkins 10/11/2022, 9:15 PM

## 2022-10-11 NOTE — BHH Group Notes (Signed)
Adult Psychoeducational Group  Date:  10/11/2022 Time: 1300-1400  Group Topic/Focus: Continuation of the group from Saturday. Looking at the lists that were created and talking about what needs to be done with the homework of 30 positives about themselves.                                     Talking about taking their power back and helping themselves to develop a positive self esteem.      Participation Quality:  Appropriate  Affect:  Appropriate  Cognitive:  Oriented  Insight: Improving  Engagement in Group:  Engaged  Modes of Intervention:  Activity, Discussion, Education, and Support  Additional Comments:  Rates her energy at a 10/10. Participated fully in the group.  [name]

## 2022-10-11 NOTE — BHH Group Notes (Signed)
Adult Psychoeducational Group Note Date:  10/11/2022 Time:  0900-1000 Group Topic/Focus: PROGRESSIVE RELAXATION. A group where deep breathing is taught and tensing and relaxation muscle groups is used. Imagery is used as well.  Pts are asked to imagine 3 pillars that hold them up when they are not able to hold themselves up and to share that with the group.   Participation Level:  Active  Participation Quality:  Appropriate  Affect:  Appropriate  Cognitive:  Approprate  Insight: Improving  Engagement in Group:  Engaged  Modes of Intervention:  deep breathing, Imagery. Discussion  Additional Comments:  Pt rates her energy at a 9.5/10. States her family, her music and movies hold her up.   : Rebecca Velasquez

## 2022-10-11 NOTE — Progress Notes (Signed)
D- Patient alert and oriented.  Denies SI, HI, AVH. Patient rates pain 6/10 located in her legs. Patient states, "I feel good" and is looking forward to discharge. Patient rates anxiety 2/10 and depression 0/10.  A- Scheduled medications administered to patient along with PRN tylenol, per MAR. Support and encouragement provided.  Routine safety checks conducted every 15 minutes.  Patient informed to notify staff with problems or concerns.  R- rNo adverse drug reactions noted. Patient contracts for safety at this time. Patient compliant with medications and treatment plan. Patient receptive, calm, and cooperative. Patient interacts well with others on the unit.  Patient remains safe at this time.

## 2022-10-11 NOTE — Progress Notes (Signed)
D:  Patient's self inventory sheet, patient sleeps good, sleep medication helpful.  Good appetite, normal energy level, good concentration.  Denied depression, hopeless and anxiety.  Denied withdrawals.  Denied SI.  Denied physical problems.  Denied physical pain.  Goal is be more free spirited, have a good day.  Spend time with other patients.  More communication with MDs.  Does have discharge plans. A:  Medications administered per MD orders.  Emotional support and encouragement given patient. R:  Denied SI and HI, contracts for safety.  Denied A/V hallucinations.  Safety maintained with 15 minute checks.

## 2022-10-12 MED ORDER — NICOTINE 21 MG/24HR TD PT24: 21.0000 mg | MEDICATED_PATCH | Freq: Every day | TRANSDERMAL | 0 refills | Status: DC

## 2022-10-12 MED ORDER — TRAZODONE HCL 50 MG PO TABS: 50.0000 mg | ORAL_TABLET | Freq: Every day | ORAL | 0 refills | Status: DC

## 2022-10-12 MED ORDER — GABAPENTIN 300 MG PO CAPS: 300.0000 mg | ORAL_CAPSULE | Freq: Three times a day (TID) | ORAL | 0 refills | Status: AC

## 2022-10-12 MED ORDER — HYDROXYZINE HCL 25 MG PO TABS: 25.0000 mg | ORAL_TABLET | Freq: Four times a day (QID) | ORAL | 0 refills | Status: DC | PRN

## 2022-10-12 MED ORDER — ARIPIPRAZOLE 5 MG PO TABS: 5.0000 mg | ORAL_TABLET | Freq: Every day | ORAL | 0 refills | Status: DC

## 2022-10-12 MED ORDER — SERTRALINE HCL 50 MG PO TABS: 50.0000 mg | ORAL_TABLET | Freq: Every day | ORAL | 0 refills | Status: DC

## 2022-10-12 NOTE — Group Note (Signed)
Recreation Therapy Group Note   Group Topic:Team Building  Group Date: 10/12/2022 Start Time: 0936 End Time: 0951 Facilitators: Roxie Gueye-McCall, LRT,CTRS Location: [address] Dayroom   Goal Area(s) Addresses:  Patient will effectively work with peer towards shared goal.  Patient will identify skills used to make activity successful.  Patient will identify how skills used during activity can be applied to reach post d/c goals.    Group Description: [employer]. In teams of 5-6, patients were given 11 craft pipe cleaners. Using the materials provided, patients were instructed to compete again the opposing team(s) to build the tallest free-standing structure from floor level. The activity was timed; difficulty increased by Probation officer as Pharmacist, hospital continued.  Systematically resources were removed with additional directions for example, placing one arm behind their back, working in silence, and shape stipulations. LRT facilitated post-activity discussion reviewing team processes and necessary communication skills involved in completion. Patients were encouraged to reflect how the skills utilized, or not utilized, in this activity can be incorporated to positively impact support systems post discharge.   Affect/Mood: Appropriate   Participation Level: Engaged   Participation Quality: Independent   Behavior: Appropriate   Speech/Thought Process: Focused   Insight: Good   Judgement: Good   Modes of Intervention: STEM Activity   Patient Response to Interventions:  Attentive   Education Outcome:  Acknowledges education and In group clarification offered    Clinical Observations/Individualized Feedback: Pt attended and participated in group session.     Plan: Continue to engage patient in RT group sessions 2-3x/week.   Landrum Carbonell-McCall, LRT,CTRS 10/12/2022 12:13 PM

## 2022-10-12 NOTE — Progress Notes (Signed)
D:  Patient denied SI and HI, contracts for safety.  Denied A/V hallucinations.  Denied pain. A:  Medications administered per MD orders.  Emotional support and encouragement given patient. R:  Safety maintained with 15 minute checks.  

## 2022-10-12 NOTE — Group Note (Signed)
Date:  10/12/2022 Time:  10:43 AM  Group Topic/Focus:  Orientation:   The focus of this group is to educate the patient on the purpose and policies of crisis stabilization and provide a format to answer questions about their admission.  The group details unit policies and expectations of patients while admitted.    Participation Level:  Active  Participation Quality:  Appropriate  Affect:  Appropriate  Cognitive:  Appropriate  Insight: Appropriate  Engagement in Group:  Engaged  Modes of Intervention:  Discussion  Additional Comments:     Jerrye Beavers 10/12/2022, 10:43 AM

## 2022-10-12 NOTE — Progress Notes (Signed)
  [facility] Adult Case Management Discharge Plan :  Will you be returning to the same living situation after discharge:  Yes,  Patient will be coming to stay with her sister  At discharge, do you have transportation home?: Yes,  Patient sister will be picking her up at 11AM Do you have the ability to pay for your medications: Yes,  Patient had united healthcare insurance   Release of information consent forms completed and in the chart;  Patient's signature needed at discharge.  Patient to Follow up at:  Follow-up Information     [facility] Go on 10/15/2022.   Why: You have an appointment on 10/15/22 at 3:30 pm for medication management services.  You also have an appointment on 10/19/22 at 3:00 pm for therapy services.  The appointments will be held in person. Contact information: [place], [place] [address]   P: [phone]                Next level of care provider has access to [place] and Suicide Prevention discussed: Yes,  [place]irkus [phone]     Has patient been referred to the Quitline?: Yes, faxed on 10/12/2022  Patient has been referred for addiction treatment: N/A  [name], [place] 10/12/2022, 9:31 AM

## 2022-10-12 NOTE — Discharge Instructions (Signed)
-  Follow-up with your outpatient psychiatric provider -instructions on appointment date, time, and address (location) are provided to you in discharge paperwork.  -Take your psychiatric medications as prescribed at discharge - instructions are provided to you in the discharge paperwork  -Follow-up with outpatient primary care doctor and other specialists -for management of preventative medicine and any chronic medical disease.  -Recommend abstinence from alcohol, tobacco, and other illicit drug use at discharge.   -If your psychiatric symptoms recur, worsen, or if you have side effects to your psychiatric medications, call your outpatient psychiatric provider, 911, [phone] or go to the nearest emergency department.  -If suicidal thoughts occur, call your outpatient psychiatric provider, 911, [phone] or go to the nearest emergency department.  Naloxone (Narcan) can help reverse an overdose when given to the victim quickly.  [place] offers free naloxone kits and instructions/training on its use.  Add naloxone to your first aid kit and you can help save a life.   Pick up your free kit at the following locations:   [place]:  [facility], [address] [place] [address] ([phone]) [facility] [address] [place] [id number] ([phone]) [facility] [address] [place] [address]  [place]: [facility] [address] [place] [address] ([phone]) [facility] [address] [place] [address] ([phone])  

## 2022-10-12 NOTE — Progress Notes (Incomplete)
Discharge Note:  Patient discharged to her sister's home with family member.  Suicide prevention information given and discussed with patient who stated she understood and had no questions.  Patient stated she received all her belongings, etc.  Patient stated she appreciated all assistance received from [facility] staff.  All required discharge information given.  Patient denied SI and HI.  Denied A/V hallucinations.

## 2022-10-12 NOTE — Discharge Summary (Addendum)
Physician Discharge Summary Note  Patient:  Rebecca Velasquez is an 37 y.o., female MRN:  [id number] DOB:  1986/01/11 Patient phone:  [phone] (home)  Patient address:   [address] [place] [address],  Total Time spent with patient: 1 hour  Date of Admission:  10/07/2022 Date of Discharge:   10/12/2022  Reason for Admission:  This is the first psychiatric admission/evaluation in this bHH for this 36 year old Caucasian female with hx of ADH, anxiety disorder & major depressive disorder. Admitted from the [facility] ED with complain of worsening symptoms of depression & suicidal ideations with plan to overdose on her medications. Patient apparently made a statement to her boyfriend that she did not want to live any more. Chart review reports indicated that she was also threatening to cut her wrist. She was taken to the [facility] long hospital for evaluation/ After medical evaluation/stabilization, Rebecca Velasquez was transferred to the [facility] for further psychiatric evaluation/treatments   Principal Problem: Major depressive disorder, recurrent severe without psychotic features [facility]) Discharge Diagnoses: Principal Problem:   Major depressive disorder, recurrent severe without psychotic features (HCC) Active Problems:   Generalized anxiety disorder   Past Psychiatric History:  Major depressive disorder, ADHD, anxiety disorder.   Past Medical History:  Past Medical History:  Diagnosis Date   Asthma    Celiac disease    Depression    Endometriosis     Past Surgical History:  Procedure Laterality Date   GASTRIC BYPASS OPEN  2016   IVC FILTER INSERTION  2016   Family History:  Family History  Problem Relation Age of Onset   Rheum arthritis Mother    Cancer Mother    Diabetes Father    Cancer Father    Family Psychiatric  History:  Bipolar disorder: Sister.                                                  Drug (opioid)addiction: Brother.                                                   Drug overdose resulting in death: Brother. Social History:  Social History   Substance and Sexual Activity  Alcohol Use Never     Social History   Substance and Sexual Activity  Drug Use Yes   Types: Marijuana    Social History   Socioeconomic History   Marital status: Single    Spouse name: Not on file   Number of children: Not on file   Years of education: Not on file   Highest education level: Not on file  Occupational History   Not on file  Tobacco Use   Smoking status: Every Day    Packs/day: 1.00    Types: Cigarettes   Smokeless tobacco: Never   Tobacco comments:    Pt interested in quitting smoking  Vaping Use   Vaping Use: Never used  Substance and Sexual Activity   Alcohol use: Never   Drug use: Yes    Types: Marijuana   Sexual activity: Yes    Birth control/protection: Condom  Other Topics Concern   Not on file  Social History Narrative   Not on  file   Social Determinants of Health   Financial Resource Strain: Not on file  Food Insecurity: No Food Insecurity (10/07/2022)   Hunger Vital Sign    Worried About Running Out of Food in the Last Year: Never true    Ran Out of Food in the Last Year: Never true  Transportation Needs: No Transportation Needs (10/07/2022)   PRAPARE - Hydrologist (Medical): No    Lack of Transportation (Non-Medical): No  Physical Activity: Not on file  Stress: Not on file  Social Connections: Not on file    Hospital Course:  Hospital Course:   Patient was admitted to the Adult Unit at [facility] under the service of Dr. Caswell Corwin.  Routine labs reviewed, no new labs. CBC: Normal, CMP: Calcium 8.8 low, ALT 14 low, otherwise normal.  Other drugs: Acetaminophen less than 10 low, salicylates less than 7 low.  UDS: Positive for tetrahydrocannabinol.  Pregnancy test: Negative.  Patient to follow-up with out patient PCP with all abnormal labs. Maintained Q 15  minutes observation for safety. During this admission patient did not require any change in her observation level and no PRN or time out was required. Length of stay was 6 days.  During this hospitalization the patient received psychosocial  assessment. Patient participated in all forms of therapy including; group, milieu, and family therapy. Psychotherapy: Social and Airline pilot, anti-bullying, learning based strategies, cognitive behavioral, and family object relations, individuation separation intervention, psychotherapies were considered.  During this admission patient met with her psychiatrist daily and received full nursing service. An individualized treatment plan according to the patient's age, level of functioning, diagnostic considerations and acute behavior was initiated.  To continue to reduce current symptoms to base line and improve the patient's overall level of functioning continued Zoloft tablet 50 mg p.o. daily for depression, Abilify tablet 5 mg p.o. daily as adjunct therapy for depression, trazodone tablet 50 mg p.o. daily at bedtime for sleep, Protonix EC tablet 80 mg p.o. daily for GERD, nicotine dose in milligrams per 24 hours patch 21 mg transdermally for smoking cessation, lorazepam 0.5 mg p.o. every 6 hours as needed for anxiety or withdrawal, hydroxyzine tablet 25 mg p.o. every 6 hours as needed for anxiety, Neurontin capsule 300 mg p.o. every 8 hours for anxiety, clonazepam tablets 0.5 mg p.o. daily at bedtime for anxiety, albuterol 108 90 base micrograms/ACT inhaler 2 puffs every 6 hours as needed for wheezing and shortness of breath. Patient is tolerating medications well with no side effects at discharge. Patient was able to verbalize reasons for living and a safety plan was developed. She appears to have a positive outlook and is agreeable to continued therapy and medication management. Safety plan was discussed with patient and sister who is in agreement.  Patient was provided with the national suicide hotline # 1-800-273-TALK as well as [facility]  number. Medications on admission were Abilify 5 mg p.o. daily, sertraline 25 mg p.o. daily x 1, sertraline 50 mg p.o. daily for depression, trazodone 50 mg p.o. nightly as needed, Vistaril 25 mg p.o. 3 times daily as needed, Klonopin 0.5 mg p.o. nightly, lorazepam 0.5 mg tab p.o. 4 times daily as needed for anxiety, nicotine patch 21 mg transdermally every 24 hours, albuterol inhaler 2 puffs every 6 hours as needed, Protonix 40 mg p.o. every morning.  Patient has responded well to these medications and is sleeping better.  During the course of hospitalization  medication changes consisted of increase increase sertraline from 25 mg p.o. daily to 50 mg p.o. daily for depression, Vistaril 25 mg p.o. 3 times daily was changed to Vistaril 5 mg p.o. every 6 hours as needed anxiety management.   At discharge, patient is medically stable and basic physical exam is within normal limits with no abnormal findings.   Patient appeared to benefit from the structure and consistency of the inpatient unit, continued current medication therapy, and integrated therapies. During the hospitalization the patient gradually improved and expressed no suicidal or homicidal thoughts, plan or intent to act on a plan. Her depressive symptoms and anxiety have appeared to subside and she has a positive outlook on her life going forward. She is able to express her feelings, and recognize when she is feeling overwhelmed. She has been calm and cooperative with no behavioral issues during this hospitalization.  A discharge conference was held with during which findings, recommendations, safety plans and aftercare plan were discussed with the patient. Please refer to the therapist note for further information about issues discussed on family session. At discharge patient denied psychotic symptoms, suicidal and homicidal ideation,  intent or plan, and there was no evidence of manic or depressive symptoms. Patient is discharged home in stable condition.  Physical Findings: AIMS:  , ,  ,  ,    CIWA:    COWS:     Aims score zero on my exam. No eps on my exam.   Musculoskeletal: Strength & Muscle Tone: within normal limits Gait & Station: normal Patient leans: N/A  Psychiatric Specialty Exam:  Presentation  General Appearance:  Casual; Fairly Groomed; Appropriate for Environment  Eye Contact: Good  Speech: Normal Rate  Speech Volume: Normal  Handedness: Right  Mood and Affect  Mood: Euthymic  Affect: Appropriate; Congruent; Full Range  Thought Process  Thought Processes: Linear  Descriptions of Associations:Intact  Orientation:Full (Time, Place and Person)  Thought Content:Logical  History of Schizophrenia/Schizoaffective disorder:No  Duration of Psychotic Symptoms:No data recorded Hallucinations:Hallucinations: None  Ideas of Reference:None  Suicidal Thoughts:Suicidal Thoughts: No  Homicidal Thoughts:Homicidal Thoughts: No   Sensorium  Memory: Immediate Good; Recent Good; Remote Good  Judgment: Fair  Insight: Fair  Art therapist  Concentration: Good  Attention Span: Fair  Recall: Good  Fund of Knowledge: Good  Language: Good  Psychomotor Activity  Psychomotor Activity:Psychomotor Activity: Normal   Assets  Assets: Communication Skills; Desire for Improvement; Financial Resources/Insurance; Housing; Physical Health; Resilience; Social Support  Sleep  Sleep:Sleep: Fair   Physical Exam: Physical Exam Vitals and nursing note reviewed.  HENT:     Head: Normocephalic.     Right Ear: External ear normal.     Left Ear: External ear normal.     Nose: Nose normal.     Mouth/Throat:     Mouth: Mucous membranes are moist.     Pharynx: Oropharynx is clear.  Eyes:     Conjunctiva/sclera: Conjunctivae normal.     Pupils: Pupils are equal,  round, and reactive to light.  Cardiovascular:     Rate and Rhythm: Normal rate.     Pulses: Normal pulses.  Pulmonary:     Effort: Pulmonary effort is normal.  Abdominal:     Palpations: Abdomen is soft.  Genitourinary:    Comments: Deferred Musculoskeletal:     Cervical back: Normal range of motion.  Skin:    General: Skin is warm.  Neurological:     General: No focal deficit present.  Mental Status: She is alert and oriented to person, place, and time.  Psychiatric:        Mood and Affect: Mood normal.        Behavior: Behavior normal.    Review of Systems  Constitutional: Negative.   HENT: Negative.    Eyes: Negative.   Respiratory: Negative.    Cardiovascular: Negative.   Gastrointestinal: Negative.   Genitourinary: Negative.   Musculoskeletal: Negative.   Skin: Negative.   Neurological: Negative.   Endo/Heme/Allergies: Negative.   Psychiatric/Behavioral:  Positive for depression (Stable with medication), substance abuse (Denies) and suicidal ideas (Patient denies). The patient is nervous/anxious (Stable with medications and improved).    Blood pressure 131/89, pulse 76, temperature 98.6 F (37 C), temperature source Oral, resp. rate 18, height 5\' 1"  (1.549 m), weight 79.4 kg, SpO2 100 %. Body mass index is 33.07 kg/m.   Social History   Tobacco Use  Smoking Status Every Day   Packs/day: 1.00   Types: Cigarettes  Smokeless Tobacco Never  Tobacco Comments   Pt interested in quitting smoking   Tobacco Cessation:  A prescription for an FDA-approved tobacco cessation medication provided at discharge   Blood Alcohol level:  Lab Results  Component Value Date   ETH <10 10/07/2022    Metabolic Disorder Labs:  Lab Results  Component Value Date   HGBA1C 5.6 12/03/2021   No results found for: "PROLACTIN" Lab Results  Component Value Date   CHOL 193 12/03/2021   TRIG 97.0 12/03/2021   HDL 45.90 12/03/2021   CHOLHDL 4 12/03/2021   VLDL 19.4  12/03/2021   LDLCALC 128 (H) 12/03/2021    See Psychiatric Specialty Exam and Suicide Risk Assessment completed by Attending Physician prior to discharge.  Discharge destination:  Home  Is patient on multiple antipsychotic therapies at discharge:  No   Has Patient had three or more failed trials of antipsychotic monotherapy by history:  No  Recommended Plan for Multiple Antipsychotic Therapies: NA  Discharge Instructions     Diet - low sodium heart healthy   Complete by: As directed    Increase activity slowly   Complete by: As directed       Allergies as of 10/12/2022   No Known Allergies      Medication List     STOP taking these medications    amoxicillin-clavulanate 875-125 MG tablet Commonly known as: AUGMENTIN   amphetamine-dextroamphetamine 20 MG tablet Commonly known as: ADDERALL   benzonatate 200 MG capsule Commonly known as: TESSALON   Caplyta 21 MG Caps Generic drug: Lumateperone Tosylate   clonazePAM 0.5 MG tablet Commonly known as: KLONOPIN   fluconazole 150 MG tablet Commonly known as: DIFLUCAN   fluticasone 50 MCG/ACT nasal spray Commonly known as: FLONASE   predniSONE 50 MG tablet Commonly known as: DELTASONE   tamsulosin 0.4 MG Caps capsule Commonly known as: FLOMAX   vortioxetine HBr 5 MG Tabs tablet Commonly known as: TRINTELLIX       TAKE these medications      Indication  albuterol 108 (90 Base) MCG/ACT inhaler Commonly known as: VENTOLIN HFA INHALE 1-2 PUFFS BY MOUTH EVERY 6 HOURS AS NEEDED FOR WHEEZE OR SHORTNESS OF BREATH What changed:  See the new instructions. Another medication with the same name was removed. Continue taking this medication, and follow the directions you see here.  Indication: Asthma   ARIPiprazole 5 MG tablet Commonly known as: ABILIFY Take 1 tablet (5 mg total) by mouth daily.  Indication: Major  Depressive Disorder   esomeprazole 40 MG packet Commonly known as: NEXIUM Take 40 mg by mouth  daily before breakfast.  Indication: Heartburn   gabapentin 300 MG capsule Commonly known as: NEURONTIN Take 1 capsule (300 mg total) by mouth every 8 (eight) hours. What changed:  medication strength See the new instructions. Another medication with the same name was removed. Continue taking this medication, and follow the directions you see here.  Indication: Generalized Anxiety Disorder   hydrOXYzine 25 MG tablet Commonly known as: ATARAX Take 1 tablet (25 mg total) by mouth every 6 (six) hours as needed for anxiety.  Indication: Feeling Anxious   nicotine 21 mg/24hr patch Commonly known as: NICODERM CQ - dosed in mg/24 hours Place 1 patch (21 mg total) onto the skin daily.  Indication: Nicotine Addiction   sertraline 50 MG tablet Commonly known as: ZOLOFT Take 1 tablet (50 mg total) by mouth daily.  Indication: Major Depressive Disorder   traZODone 50 MG tablet Commonly known as: DESYREL Take 1 tablet (50 mg total) by mouth at bedtime. What changed:  medication strength how much to take how to take this when to take this additional instructions  Indication: [place]. Go on 10/15/2022.   Why: You have an appointment on 10/15/22 at 3:30 pm for medication management services.  You also have an appointment on 10/19/22 at 3:00 pm for therapy services.  The appointments will be held in person. Contact information: [place], [place] [address]   P: [phone]               Follow-up recommendations:   Discharge Recommendations:  The patient is being discharged with her family. Patient is to take her discharge medications as ordered.  See follow up above. We recommend that she participate in individual therapy to target uncontrollable agitation and substance abuse.  We recommend that she participate in family therapy to target the conflict with his family, to improve communication  skills and conflict resolution skills.  Patient/Family is to initiate/implement a contingency based behavioral model to address patient's behavior.  We recommend that she get AIMS scale, height, weight, blood pressure, fasting lipid panel, fasting blood sugar in three months from discharge as he's on atypical antipsychotics.   Patient will benefit from monitoring of recurrent suicidal ideation since patient is on antidepressant medication.  The patient should abstain from all illicit substances and alcohol.   If the patient's symptoms worsen or do not continue to improve or if the patient becomes actively suicidal or homicidal then it is recommended that the patient return to the closest hospital emergency room or call 911 for further evaluation and treatment. National Suicide Prevention Lifeline 1800-SUICIDE or 726-176-4141. Please follow up with your primary medical doctor for all other medical needs.  The patient has been educated on the possible side effects to medications and she/her guardian is to contact a medical professional and inform outpatient provider of any new side effects of medication. She is to take regular diet and activity as tolerated.  Will benefit from moderate daily exercise. Patient/Family was educated about removing/locking any firearms, medications or dangerous products from the home.   Activity:  As tolerated Diet:  Regular diet no added salt   Total Time Spent in Direct Patient Care:  I personally spent 35 minutes on the unit in direct patient care. The direct patient care time included  face-to-face time with the patient, reviewing the patient's chart, communicating with other professionals, and coordinating care. Greater than 50% of this time was spent in counseling or coordinating care with the patient regarding goals of hospitalization, psycho-education, and discharge planning needs.  On my assessment the patient denied SI, HI, AVH, paranoia, ideas of reference,  or first rank symptoms on day of discharge. Patient denied drug cravings or active signs of withdrawal. Patient denied medication side-effects. Patient was not deemed to be a danger to self or others on day of discharge and was in agreement with discharge plans.   I have independently evaluated the patient during a face-to-face assessment on the day of discharge. I reviewed the patient's chart, and I participated in key portions of the service. I discussed the case with the APP, and I agree with the assessment and plan of care as documented in the APP's note, as addended by me or notated below:  I directly edited the note, as above.   Phineas Inches, MD Psychiatrist

## 2022-10-12 NOTE — BHH Suicide Risk Assessment (Addendum)
Suicide Risk Assessment  Discharge Assessment    [facility] Discharge Suicide Risk Assessment  Principal Problem: Major depressive disorder, recurrent severe without psychotic features [facility]) Discharge Diagnoses: Principal Problem:   Major depressive disorder, recurrent severe without psychotic features ([place]) Active Problems:   Generalized anxiety disorder  Total Time spent with patient: 1 hour   This is the first psychiatric admission/evaluation in this bHH for this 37 year old Caucasian female with hx of ADH, anxiety disorder & major depressive disorder. Admitted from the [facility] ED with complain of worsening symptoms of depression & suicidal ideations with plan to overdose on her medications. Patient apparently made a statement to her boyfriend that she did not want to live any more.     Psychiatric Specialty Exam  Presentation  General Appearance:  Casual; Fairly Groomed; Appropriate for Environment  Eye Contact: Good  Speech: Normal Rate  Speech Volume: Normal  Handedness: Right  Mood and Affect  Mood: Euthymic  Duration of Depression Symptoms: Greater than two weeks  Affect: Appropriate; Congruent; Full Range  Thought Process  Thought Processes: Linear  Descriptions of Associations:Intact  Orientation:Full (Time, Place and Person)  Thought Content:Logical  History of Schizophrenia/Schizoaffective disorder:No  Duration of Psychotic Symptoms:No data recorded Hallucinations:Hallucinations: None  Ideas of Reference:None  Suicidal Thoughts:Suicidal Thoughts: No  Homicidal Thoughts:Homicidal Thoughts: No   Sensorium  Memory: Immediate Good; Recent Good; Remote Good  Judgment: Fair  Insight: Fair  Community education officer  Concentration: Good  Attention Span: Fair  Recall: Good  Fund of Knowledge: Good  Language: Good  Psychomotor Activity  Psychomotor Activity:Psychomotor Activity: Normal   Assets  Assets: Communication  Skills; Desire for Improvement; Financial Resources/Insurance; Housing; Physical Health; Resilience; Social Support  Sleep  Sleep:Sleep: Fair   Physical Exam: Physical Exam Vitals and nursing note reviewed.  Constitutional:      Appearance: Normal appearance.  HENT:     Head: Normocephalic.     Right Ear: External ear normal.     Left Ear: External ear normal.     Nose: Nose normal.     Mouth/Throat:     Mouth: Mucous membranes are moist.     Pharynx: Oropharynx is clear.  Eyes:     Conjunctiva/sclera: Conjunctivae normal.     Pupils: Pupils are equal, round, and reactive to light.  Cardiovascular:     Rate and Rhythm: Normal rate.     Pulses: Normal pulses.  Pulmonary:     Effort: Pulmonary effort is normal.  Abdominal:     Palpations: Abdomen is soft.  Genitourinary:    Comments: Deferred Musculoskeletal:        General: Normal range of motion.     Cervical back: Normal range of motion.  Skin:    General: Skin is warm.  Neurological:     General: No focal deficit present.     Mental Status: She is alert and oriented to person, place, and time.  Psychiatric:        Mood and Affect: Mood normal.        Behavior: Behavior normal.    Review of Systems  Constitutional: Negative.   HENT: Negative.    Eyes: Negative.   Respiratory: Negative.    Cardiovascular: Negative.   Gastrointestinal: Negative.   Genitourinary: Negative.   Musculoskeletal: Negative.   Skin: Negative.   Neurological: Negative.   Endo/Heme/Allergies: Negative.   Psychiatric/Behavioral:  Positive for depression (Stable with medications), substance abuse (Patient denies) and suicidal ideas (Patient denies). The patient is nervous/anxious (  Stable with medication and improved).    Blood pressure 131/89, pulse 76, temperature 98.6 F (37 C), temperature source Oral, resp. rate 18, height 5\' 1"  (1.549 m), weight 79.4 kg, SpO2 100 %. Body mass index is 33.07 kg/m.  Mental Status Per Nursing  Assessment::   On Admission:  NA  Demographic Factors:  Adolescent or young adult, Caucasian, and Low socioeconomic status  Loss Factors: NA  Historical Factors: Prior suicide attempts, Family history of mental illness or substance abuse, and Impulsivity  Risk Reduction Factors:   Sense of responsibility to family, Employed, Living with another person, especially a relative, Positive social support, Positive therapeutic relationship, and Positive coping skills or problem solving skills  Continued Clinical Symptoms:  Severe Anxiety and/or Agitation Panic Attacks Depression:   Impulsivity Recent sense of peace/wellbeing Obsessive-Compulsive Disorder More than one psychiatric diagnosis Previous Psychiatric Diagnoses and Treatments  Cognitive Features That Contribute To Risk:  Polarized thinking    Suicide Risk:  Mild:  There are no identifiable suicide plans, no associated intent, mild dysphoria and related symptoms, good self-control (both objective and subjective assessment), few other risk factors, and identifiable protective factors, including available and accessible social support.    Follow-up Information     [facility]. Go on 10/15/2022.   Why: You have an appointment on 10/15/22 at 3:30 pm for medication management services.  You also have an appointment on 10/19/22 at 3:00 pm for therapy services.  The appointments will be held in person. Contact information: [address], [place] [address]   P: [phone]                Plan Of Care/Follow-up recommendations:  Discharge Recommendations:  The patient is being discharged with her family. Patient is to take her discharge medications as ordered.  See follow up above. We recommend that she participate in individual therapy to target uncontrollable agitation and substance abuse.  We recommend that she participate in family therapy to target the conflict with his family, to improve  communication skills and conflict resolution skills.  Patient/Family is to initiate/implement a contingency based behavioral model to address patient's behavior. We recommend that she get AIMS scale, height, weight, blood pressure, fasting lipid panel, fasting blood sugar in three months from discharge as he's on atypical antipsychotics.  Patient will benefit from monitoring of recurrent suicidal ideation since patient is on antidepressant medication. The patient should abstain from all illicit substances and alcohol.  If the patient's symptoms worsen or do not continue to improve or if the patient becomes actively suicidal or homicidal then it is recommended that the patient return to the closest hospital emergency room or call 911 for further evaluation and treatment. National Suicide Prevention Lifeline 1800-SUICIDE or [phone])161-1509. Please follow up with your primary medical doctor for all other medical needs.  The patient has been educated on the possible side effects to medications and she/her guardian is to contact a medical professional and inform outpatient provider of any new side effects of medication. She is to take regular diet and activity as tolerated.  Will benefit from moderate daily exercise. Patient/Family was educated about removing/locking any firearms, medications or dangerous products from the home.  Activity:  As tolerated Diet:  Regular diet no added salt  I directly edited the SRA by the NP, as above.   Janine Limbo, MD

## 2022-10-12 NOTE — Progress Notes (Signed)
   10/12/22 0555  15 Minute Checks  Location Bedroom  Visual Appearance Calm  Behavior Composed  Sleep (Behavioral Health Patients Only)  Calculate sleep? (Click Yes once per 24 hr at 0600 safety check) Yes  Documented sleep last 24 hours 6.5

## 2022-10-12 NOTE — Plan of Care (Signed)
Nurse discussed coping skills with patient.  

## 2022-10-12 NOTE — Group Note (Unsigned)
Date:  10/12/2022 Time:  9:48 AM  Group Topic/Focus:  Orientation:   The focus of this group is to educate the patient on the purpose and policies of crisis stabilization and provide a format to answer questions about their admission.  The group details unit policies and expectations of patients while admitted.     Participation Level:  {BHH PARTICIPATION CZYSA:63016}  Participation Quality:  {BHH PARTICIPATION QUALITY:22265}  Affect:  {BHH AFFECT:22266}  Cognitive:  {BHH COGNITIVE:22267}  Insight: {BHH Insight2:20797}  Engagement in Group:  {BHH ENGAGEMENT IN WFUXN:23557}  Modes of Intervention:  {BHH MODES OF INTERVENTION:22269}  Additional Comments:  ***  Jerrye Beavers 10/12/2022, 9:48 AM

## 2022-10-29 ENCOUNTER — Ambulatory Visit: Payer: Self-pay | Admitting: Family Medicine

## 2022-11-16 ENCOUNTER — Encounter (HOSPITAL_COMMUNITY): Payer: Self-pay

## 2022-11-16 ENCOUNTER — Ambulatory Visit (HOSPITAL_COMMUNITY)
Admission: EM | Admit: 2022-11-16 | Discharge: 2022-11-16 | Disposition: A | Discharge status: Home / Self Care | Payer: 59 | Attending: Physician Assistant | Admitting: Physician Assistant

## 2022-11-16 DIAGNOSIS — J0100 Acute maxillary sinusitis, unspecified: Secondary | ICD-10-CM | POA: Diagnosis not present

## 2022-11-16 MED ORDER — AMOXICILLIN-POT CLAVULANATE 875-125 MG PO TABS: 1.0000 | ORAL_TABLET | Freq: Two times a day (BID) | ORAL | 0 refills | Status: DC

## 2022-11-16 MED ORDER — PREDNISONE 20 MG PO TABS: 40.0000 mg | ORAL_TABLET | Freq: Every day | ORAL | 0 refills | Status: AC

## 2022-11-16 NOTE — ED Triage Notes (Signed)
Patient states she has been having right sinus pain and pressure x 2 days.   Patient states she has taken Mucinex, Dayquil, nasal sprays, afrin, Tylenol and Advil alternating.  Patient states she took Advil at 0730 today.

## 2022-11-16 NOTE — Discharge Instructions (Signed)
Start Augmentin twice daily.  Take prednisone 40 mg in the morning for 4 days.  Do not take NSAIDs with this medication including aspirin, ibuprofen/Advil, naproxen/Aleve.  You can use over-the-counter medication including Tylenol, Mucinex, Flonase.  I also recommend nasal saline/sinus rinses.  If your symptoms or not improving within a week return for reevaluation.  If anything worsens you should be seen immediately.

## 2022-11-16 NOTE — ED Provider Notes (Signed)
[facility]    CSN: [place]:[id number] Arrival date & time: 11/16/22  0945      History   Chief Complaint Chief Complaint  Patient presents with   Nasal Congestion    HPI Rebecca Velasquez is a 37 y.o. female.   Patient presents today with a several week history of sinus pressure that has worsened significantly in the past 48 hours.  She reports congestion, right maxillary sinus pain, headache, mild cough.  Denies any chest pain, shortness of breath, nausea, vomiting, diarrhea.  She has tried multiple over-the-counter medications including Mucinex, DayQuil, Flonase, Afrin, Tylenol, ibuprofen with minimal improvement of symptoms.  She has had COVID several years ago.  She has not had COVID-19 vaccination.  She does have a history of asthma but has not required increased use of her albuterol inhaler.  Denies any recent antibiotics or steroids.  Denies any known sick contacts.  She does smoke.  She is confident that she is not pregnant.  Denies previous sinus surgery.    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      Home Medications    Prior to Admission medications   Medication Sig Start Date End Date Taking? Authorizing Provider  amoxicillin-clavulanate  (AUGMENTIN) 875-125 MG tablet Take 1 tablet by mouth every 12 (twelve) hours. 11/16/22  Yes Katricia Prehn K, PA-C  predniSONE (DELTASONE) 20 MG tablet Take 2 tablets (40 mg total) by mouth daily for 4 days. 11/16/22 11/20/22 Yes Lutisha Knoche K, PA-C  albuterol (VENTOLIN HFA) 108 (90 Base) MCG/ACT inhaler INHALE 1-2 PUFFS BY MOUTH EVERY 6 HOURS AS NEEDED FOR WHEEZE OR SHORTNESS OF BREATH Patient taking differently: Inhale 2 puffs into the lungs every 6 (six) hours as needed for wheezing or shortness of breath. 03/02/22   Vivi Barrack, MD  ARIPiprazole (ABILIFY) 5 MG tablet Take 1 tablet (5 mg total) by mouth daily. 10/12/22 11/11/22  Massengill, Ovid Curd, MD  esomeprazole (NEXIUM) 40 MG packet Take 40 mg by mouth daily before breakfast. Patient not taking: Reported on 10/07/2022 06/04/22   Vivi Barrack, MD  gabapentin (NEURONTIN) 300 MG capsule Take 1 capsule (300 mg total) by mouth every 8 (eight) hours. 10/12/22 11/11/22  Massengill, Ovid Curd, MD  hydrOXYzine (ATARAX) 25 MG tablet Take 1 tablet (25 mg total) by mouth every 6 (six) hours as needed for anxiety. 10/12/22   Massengill, Ovid Curd, MD  sertraline (ZOLOFT) 50 MG tablet Take 1 tablet (50 mg total) by mouth daily. 10/12/22 11/11/22  Massengill, Ovid Curd, MD  traZODone (DESYREL) 50 MG tablet Take 1 tablet (50 mg total) by mouth at bedtime. 10/12/22 11/11/22  Massengill, Ovid Curd, MD  QUEtiapine (SEROQUEL) 200 MG tablet Take 200 mg by mouth at bedtime.  05/05/19  [provider]    Family History Family History  Problem Relation Age of Onset   Rheum arthritis Mother    Cancer Mother    Diabetes Father    Cancer Father     Social History Social History   Tobacco Use   Smoking status: Every Day    Packs/day: 1.00    Types: Cigarettes   Smokeless tobacco: Never   Tobacco comments:    Pt interested in quitting smoking  Vaping Use   Vaping Use: Never used  Substance Use Topics   Alcohol use: Never   Drug use: Yes    Types: Marijuana      Allergies   Patient has no known allergies.   Review of Systems Review of Systems  Constitutional:  Positive for activity change. Negative for appetite change, fatigue and fever.  HENT:  Positive for congestion, sinus pressure and sinus pain. Negative for sneezing and sore throat.   Respiratory:  Positive for cough. Negative for shortness of breath.   Cardiovascular:  Negative for chest pain.  Gastrointestinal:  Negative for abdominal pain, diarrhea, nausea and vomiting.  Neurological:  Positive for headaches. Negative for dizziness and light-headedness.     Physical Exam Triage Vital Signs ED Triage Vitals  Enc Vitals Group     BP 11/16/22 1028 136/83     Pulse Rate 11/16/22 1028 76     Resp 11/16/22 1028 16     Temp 11/16/22 1028 99.4 F (37.4 C)     Temp Source 11/16/22 1028 Oral     SpO2 11/16/22 1028 95 %     Weight --      Height --      Head Circumference --      Peak Flow --      Pain Score 11/16/22 1030 8     Pain Loc --      Pain Edu? --      Excl. in [place]? --    No data found.  Updated Vital Signs BP 136/83 (BP Location: Right Arm)   Pulse 76   Temp 99.4 F (37.4 C) (Oral)   Resp 16   LMP 10/17/2022 (Approximate)   SpO2 95%   Visual Acuity Right Eye Distance:   Left Eye Distance:   Bilateral Distance:    Right Eye Near:   Left Eye Near:    Bilateral Near:     Physical Exam Vitals reviewed.  Constitutional:      General: She is awake. She is not in acute distress.    Appearance: Normal appearance. She is well-developed. She is not ill-appearing.     Comments: Very pleasant female appears stated age in no acute distress sitting comfortably in exam room  HENT:     Head: Normocephalic and atraumatic.     Right Ear: Tympanic membrane, ear canal and external ear normal. Tympanic membrane is not erythematous or bulging.     Left Ear: Tympanic membrane, ear canal and external ear normal. Tympanic membrane is not erythematous or bulging.      Nose:     Right Sinus: Maxillary sinus tenderness and frontal sinus tenderness present.     Left Sinus: No maxillary sinus tenderness or frontal sinus tenderness.     Mouth/Throat:     Pharynx: Uvula midline. No oropharyngeal exudate or posterior oropharyngeal erythema.  Cardiovascular:     Rate and Rhythm: Normal rate and regular rhythm.     Heart sounds: Normal heart sounds, S1 normal and S2 normal.  No murmur heard. Pulmonary:     Effort: Pulmonary effort is normal.     Breath sounds: Normal breath sounds. No wheezing, rhonchi or rales.     Comments: Clear to auscultation bilaterally Psychiatric:        Behavior: Behavior is cooperative.      UC Treatments / Results  Labs (all labs ordered are listed, but only abnormal results are displayed) Labs Reviewed - No data to display  EKG   Radiology No results found.  Procedures Procedures (including critical care time)  Medications Ordered in UC Medications - No data to display  Initial Impression / Assessment and Plan / UC Course  I have reviewed the triage vital signs and the nursing notes.  Pertinent labs & imaging results that were available during my care of the patient were reviewed by me and considered in my medical decision making (see chart for details).     Patient is well-appearing, afebrile, nontoxic, nontachycardic.  No indication for viral testing as she has been symptomatic for several weeks and this would not change management.  Given prolonged and recent upper worsening of symptoms will cover for secondary bacterial infection.  She was started on Augmentin twice daily for 7 days.  Will also start prednisone burst to help with symptoms.  She was started on 40 mg in the morning for 4 days with instruction not to take NSAIDs with this medication due to risk of GI bleeding.  She can use over-the-counter medication including Tylenol, Mucinex, Flonase for symptom relief.  Recommended that she use nasal saline and  sinus rinses.  She is to rest and drink plenty of fluid.  If her symptoms or not improving within a week she is to return for reevaluation.  If she has any worsening symptoms including shortness of breath, fever, worsening cough she needs to be seen immediately.  Strict return precautions given.  Work excuse note provided.  Final Clinical Impressions(s) / UC Diagnoses   Final diagnoses:  Acute non-recurrent maxillary sinusitis     Discharge Instructions      Start Augmentin twice daily.  Take prednisone 40 mg in the morning for 4 days.  Do not take NSAIDs with this medication including aspirin, ibuprofen/Advil, naproxen/Aleve.  You can use over-the-counter medication including Tylenol, Mucinex, Flonase.  I also recommend nasal saline/sinus rinses.  If your symptoms or not improving within a week return for reevaluation.  If anything worsens you should be seen immediately.     ED Prescriptions     Medication Sig Dispense Auth. Provider   predniSONE (DELTASONE) 20 MG tablet Take 2 tablets (40 mg total) by mouth daily for 4 days. 8 tablet Nik Gorrell K, PA-C   amoxicillin-clavulanate (AUGMENTIN) 875-125 MG tablet Take 1 tablet by mouth every 12 (twelve) hours. 14 tablet Melonie Germani, Derry Skill, PA-C      PDMP not reviewed this encounter.   Terrilee Croak, PA-C 11/16/22 1049

## 2022-11-23 ENCOUNTER — Ambulatory Visit: Payer: 59 | Admitting: Family Medicine

## 2022-11-23 ENCOUNTER — Encounter: Payer: Self-pay | Admitting: Family Medicine

## 2022-11-23 VITALS — BP 122/79 | HR 89 | Temp 97.3°F | Ht 61.0 in | Wt 175.0 lb

## 2022-11-23 DIAGNOSIS — L732 Hidradenitis suppurativa: Secondary | ICD-10-CM

## 2022-11-23 DIAGNOSIS — Z124 Encounter for screening for malignant neoplasm of cervix: Secondary | ICD-10-CM | POA: Diagnosis not present

## 2022-11-23 DIAGNOSIS — M5441 Lumbago with sciatica, right side: Secondary | ICD-10-CM

## 2022-11-23 DIAGNOSIS — J329 Chronic sinusitis, unspecified: Secondary | ICD-10-CM

## 2022-11-23 MED ORDER — PREDNISONE 10 MG PO TABS: ORAL_TABLET | ORAL | 0 refills | Status: DC

## 2022-11-23 MED ORDER — CYCLOBENZAPRINE HCL 10 MG PO TABS: 10.0000 mg | ORAL_TABLET | Freq: Three times a day (TID) | ORAL | 0 refills | Status: DC | PRN

## 2022-11-23 MED ORDER — AZITHROMYCIN 250 MG PO TABS: ORAL_TABLET | ORAL | 0 refills | Status: DC

## 2022-11-23 NOTE — Patient Instructions (Signed)
It was very nice to see you today!  We will start prednisone for your back.  This often will be your sinuses.  You can use Flexeril as needed to help with muscle spasms.  I will refer you to see a sports medicine doctor.   Please start the azithromycin for your sinuses if your sinuses or not improving in the next few days.  We will refer you to the dermatologist as well as the gynecologist.  Please come back soon for your annual physical.  Take care, Dr [name]  PLEASE NOTE:  If you had any lab tests, please let us know if you have not heard back within a few days. You may see your results on mychart before we have a chance to review them but we will give you a call once they are reviewed by [place].   If we ordered any referrals today, please let us know if you have not heard from their office within the next week.   If you had any urgent prescriptions sent in today, please check with the pharmacy within an hour of our visit to make sure the prescription was transmitted appropriately.   Please try these tips to maintain a healthy lifestyle:  Eat at least 3 REAL meals and 1-2 snacks per day.  Aim for no more than 5 hours between eating.  If you eat breakfast, please do so within one hour of getting up.   Each meal should contain half fruits/vegetables, one quarter protein, and one quarter carbs (no bigger than a computer mouse)  Cut down on sweet beverages. This includes juice, soda, and sweet tea.   Drink at least 1 glass of water with each meal and aim for at least 8 glasses per day  Exercise at least 150 minutes every week.

## 2022-11-23 NOTE — Progress Notes (Signed)
   Rebecca Velasquez is a 37 y.o. female who presents today for an office visit.  Assessment/Plan:  New/Acute Problems: Low Back Pain No red flags.  Consistent with previous flares of sciatica.  She does have some muscular tightness on exam today as well.  We will start prednisone taper.  This is typically worked well for her.  Will also start Flexeril.  Due to her current issues that she would like to see a specialist for this as well.  Will place referral for her to see sports medicine.  We discussed reasons return to care.  Follow up as needed.   Sinusitis  No red flags.  She has had improvement with amoxicillin.  She should have some improvement above prednisone as well.  Will send a pocket prescription for azithromycin with instruction to not start Symptoms do not continue to improve over the next several days.  Chronic Problems Addressed Today: Hidradenitis Overall symptoms are stable but still not controlled. She would like to see dermatology for this.  Will place referral today.  She is due for Pap.  Will refer to GYN per patient request.    Subjective:  HPI:  See A/p for status of chronic conditions.  Patient is here today with concern for sinus infection.  She went to urgent care about a week ago for similar issue.  She was started on Augmentin twice daily for 7 days as well as a prednisone burst. This helped modestly. She has felt like her symptoms have come back. More facial pain and pressure. Feels like she has more swelling inside of her mouth.   She has also been having some issues with right lower back pain. She is concerned about sciatica. This started about 1-2 weeks ago. Feels like prior flares of sciatica.  He has history of intermittent low back issues mostly due to work.  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 She has also fell recently after slipping while bringing a dog out at work.       Objective:  Physical Exam: BP 122/79   Pulse 89    Temp (!) 97.3 F (36.3 C) (Temporal)   Ht 5' 1"$  (1.549 m)   Wt 175 lb (79.4 kg)   LMP 11/19/2022 (Approximate)   SpO2 97%   BMI 33.07 kg/m   Wt Readings from Last 3 Encounters:  11/23/22 175 lb (79.4 kg)  08/03/22 189 lb 9.6 oz (86 kg)  06/03/22 206 lb 3.2 oz (93.5 kg)    Gen: No acute distress, resting comfortably HEENT: TMs clear effusion.  OP clear. CV: Regular rate and rhythm with no murmurs appreciated Pulm: Normal work of breathing, clear to auscultation bilaterally with no crackles, wheezes, or rhonchi MSK:  - Back: No deformities.  Tenderness palpation along right lower lumbar paraspinal muscles..  Straight leg raise negative.  Neurovascular intact distally. Neuro: Grossly normal, moves all extremities Psych: Normal affect and thought content      Cledith Abdou M. [name], MD 11/23/2022 10:44 AM

## 2022-11-23 NOTE — Assessment & Plan Note (Addendum)
Overall symptoms are stable but still not controlled. She would like to see dermatology for this.  Will place referral today.

## 2022-12-01 ENCOUNTER — Encounter: Payer: Self-pay | Admitting: Family Medicine

## 2023-02-15 ENCOUNTER — Encounter: Payer: 59 | Admitting: Advanced Practice Midwife

## 2023-02-15 NOTE — Progress Notes (Deleted)
   Subjective:     Rebecca Velasquez is a 37 y.o. female with medical hx significant for morbid obesity, s/p gastric bypass; depression/anxiety; endometriosis; smoking here at [facility] for a routine exam.  Current complaints: ***.  Personal health questionnaire reviewed: {yes/no:9010}.  Do you have a primary care provider? *** Do you feel safe at home? ***  Flowsheet Row Office Visit from 11/23/2022 in [facility]  PHQ-2 Total Score 0       Health Maintenance Due  Topic Date Due   PAP SMEAR-Modifier  Never done   COVID-19 Vaccine (3 - 2023-24 season) 05/29/2022     Risk factors for chronic health problems: Smoking: Alchohol/how much: Pt BMI: There is no height or weight on file to calculate BMI.   Gynecologic History No LMP recorded. Contraception: {method:5051} Last Pap: ***. Results were: {norm/abn:16337} Last mammogram: ***. Results were: {norm/abn:16337}  Obstetric History OB History  No obstetric history on file.     {Common ambulatory SmartLinks:19316}  Review of Systems {ros; complete:30496}    Objective:  There were no vitals taken for this visit.  VS reviewed, nursing note reviewed,  Constitutional: well developed, well nourished, no distress HEENT: normocephalic, thyroid without enlargement or mass HEART: RRR, no murmurs rubs/gallops RESP: clear and equal to auscultation bilaterally in all lobes  Breast Exam:  ***Deferred with low risks and shared decision making, discussed recommendation to start mammogram between 40-50 yo/ exam performed: right breast normal without mass, skin or nipple changes or axillary nodes, left breast normal without mass, skin or nipple changes or axillary nodes Abdomen: soft Neuro: alert and oriented x 3 Skin: warm, dry Psych: affect normal Pelvic exam: ***Deferred/ Performed: Cervix pink, visually closed, without lesion, scant white creamy discharge, vaginal walls and external genitalia normal Bimanual  exam: Cervix 0/long/high, firm, anterior, neg CMT, uterus nontender, nonenlarged, adnexa without tenderness, enlargement, or mass        Assessment/Plan:   There are no diagnoses linked to this encounter.     No follow-ups on file.   Sharen Counter, CNM 8:48 AM

## 2023-03-22 NOTE — Progress Notes (Deleted)
    Aleen Sells D[facility] [address] [place] [address] Phone: [phone]   Assessment and Plan:     There are no diagnoses linked to this encounter.  ***   Pertinent previous records reviewed include ***   Follow Up: ***     Subjective:   I, Courtnee Myer, am serving as a Neurosurgeon for Doctor Richardean Sale  Chief Complaint: multiple skeletal issues   HPI:   03/29/2023 Patient is a 37 year old female complaining of multiple skeletal issues. Patient states   Relevant Historical Information: ***  Additional pertinent review of systems negative.   Current Outpatient Medications:    albuterol (VENTOLIN HFA) 108 (90 Base) MCG/ACT inhaler, INHALE 1-2 PUFFS BY MOUTH EVERY 6 HOURS AS NEEDED FOR WHEEZE OR SHORTNESS OF BREATH (Patient taking differently: Inhale 2 puffs into the lungs every 6 (six) hours as needed for wheezing or shortness of breath.), Disp: 8.5 each, Rfl: 1   ARIPiprazole (ABILIFY) 5 MG tablet, Take 1 tablet (5 mg total) by mouth daily., Disp: 30 tablet, Rfl: 0   azithromycin (ZITHROMAX) 250 MG tablet, Take 2 tabs day 1, then 1 tab daily, Disp: 6 each, Rfl: 0   cyclobenzaprine (FLEXERIL) 10 MG tablet, Take 1 tablet (10 mg total) by mouth 3 (three) times daily as needed for muscle spasms., Disp: 30 tablet, Rfl: 0   esomeprazole (NEXIUM) 40 MG packet, Take 40 mg by mouth daily before breakfast., Disp: 90 each, Rfl: 3   gabapentin (NEURONTIN) 300 MG capsule, Take 1 capsule (300 mg total) by mouth every 8 (eight) hours., Disp: 90 capsule, Rfl: 0   hydrOXYzine (ATARAX) 25 MG tablet, Take 1 tablet (25 mg total) by mouth every 6 (six) hours as needed for anxiety., Disp: 30 tablet, Rfl: 0   predniSONE (DELTASONE) 10 MG tablet, 6-5-4-3-2-1-OFF, Disp: 21 tablet, Rfl: 0   sertraline (ZOLOFT) 50 MG tablet, Take 1 tablet (50 mg total) by mouth daily., Disp: 30 tablet, Rfl: 0   traZODone (DESYREL) 50 MG tablet, Take 1 tablet (50 mg  total) by mouth at bedtime., Disp: 30 tablet, Rfl: 0   Objective:     There were no vitals filed for this visit.    There is no height or weight on file to calculate BMI.    Physical Exam:    ***   Electronically signed by:  Aleen Sells [facility] 7:27 AM 03/22/23

## 2023-03-29 ENCOUNTER — Ambulatory Visit: Payer: 59 | Admitting: Sports Medicine

## 2023-04-28 ENCOUNTER — Encounter (INDEPENDENT_AMBULATORY_CARE_PROVIDER_SITE_OTHER): Payer: Self-pay

## 2023-05-11 ENCOUNTER — Encounter: Payer: 59 | Admitting: Family Medicine

## 2023-05-13 ENCOUNTER — Encounter (HOSPITAL_COMMUNITY): Payer: Self-pay

## 2023-05-13 ENCOUNTER — Ambulatory Visit (HOSPITAL_COMMUNITY)
Admission: EM | Admit: 2023-05-13 | Discharge: 2023-05-13 | Disposition: A | Discharge status: Home / Self Care | Payer: 59 | Attending: Family Medicine | Admitting: Family Medicine

## 2023-05-13 DIAGNOSIS — J0100 Acute maxillary sinusitis, unspecified: Secondary | ICD-10-CM | POA: Diagnosis not present

## 2023-05-13 DIAGNOSIS — K0889 Other specified disorders of teeth and supporting structures: Secondary | ICD-10-CM

## 2023-05-13 MED ORDER — DEXAMETHASONE SODIUM PHOSPHATE 10 MG/ML IJ SOLN
10.0000 mg | Freq: Once | INTRAMUSCULAR | Status: AC
  Administered 2023-05-13: 10 mg via INTRAMUSCULAR

## 2023-05-13 MED ORDER — AMOXICILLIN-POT CLAVULANATE 875-125 MG PO TABS: 1.0000 | ORAL_TABLET | Freq: Two times a day (BID) | ORAL | 0 refills | Status: DC

## 2023-05-13 MED ORDER — TRAMADOL HCL 50 MG PO TABS: 50.0000 mg | ORAL_TABLET | Freq: Four times a day (QID) | ORAL | 0 refills | Status: DC | PRN

## 2023-05-13 MED ORDER — DEXAMETHASONE SODIUM PHOSPHATE 10 MG/ML IJ SOLN
INTRAMUSCULAR | Status: AC
  Filled 2023-05-13: qty 1

## 2023-05-13 NOTE — Discharge Instructions (Addendum)
Be aware, you have been prescribed pain medications that may cause drowsiness. While taking this medication, do not take any other medications containing acetaminophen (Tylenol). Do not combine with alcohol or recreational drugs. Please do not drive, operate heavy machinery, or take part in activities that require making important decisions while on this medication as your judgement may be clouded.  Meds ordered this encounter  Medications   dexamethasone (DECADRON) injection 10 mg   amoxicillin-clavulanate (AUGMENTIN) 875-125 MG tablet    Sig: Take 1 tablet by mouth every 12 (twelve) hours.    Dispense:  20 tablet    Refill:  0   traMADol (ULTRAM) 50 MG tablet    Sig: Take 1 tablet (50 mg total) by mouth every 6 (six) hours as needed.    Dispense:  15 tablet    Refill:  0

## 2023-05-13 NOTE — ED Triage Notes (Signed)
Pt states facial pain over her sinuses for the past 10 days.  States she has been taking advil with no relief.

## 2023-05-13 NOTE — ED Provider Notes (Signed)
[facility]   [id number] 05/13/23 Arrival Time: 1920  ASSESSMENT & PLAN:  1. Acute non-recurrent maxillary sinusitis   2. Pain, dental     Meds ordered this encounter  Medications   dexamethasone (DECADRON) injection 10 mg   amoxicillin-clavulanate (AUGMENTIN) 875-125 MG tablet    Sig: Take 1 tablet by mouth every 12 (twelve) hours.    Dispense:  20 tablet    Refill:  0   traMADol (ULTRAM) 50 MG tablet    Sig: Take 1 tablet (50 mg total) by mouth every 6 (six) hours as needed.    Dispense:  15 tablet    Refill:  0    Discussed typical duration of symptoms. OTC symptom care as needed. Ensure adequate fluid intake and rest.  [place] Controlled Substances Registry consulted for this patient. I feel the risk/benefit ratio today is favorable for proceeding with this prescription for a controlled substance. Medication sedation precautions given.    Follow-up Information     Ardith Dark, MD.   Specialty: Family Medicine Why: If worsening or failing to improve as anticipated. Contact information: [address] [place] [address] [phone]                 Reviewed expectations re: course of current medical issues. Questions answered. Outlined signs and symptoms indicating need for more acute intervention. Patient verbalized understanding. After Visit Summary given.   SUBJECTIVE: History from: patient.  Rebecca Velasquez is a 37 y.o. female who presents with complaint of nasal congestion, post-nasal drainage, and sinus pain. Onset gradual,  2 w + . Respiratory symptoms: none. Fever: absent. Overall normal PO intake without n/v. OTC treatment: ibuprofen without relief. Also reports upper dental pain. Seasonal allergies: no. History of frequent sinus infections: no. No specific aggravating or alleviating factors reported. Social History   Tobacco Use  Smoking Status Every Day   Current packs/day: 1.00   Types: Cigarettes  Smokeless Tobacco Never   Tobacco Comments   Pt interested in quitting smoking    OBJECTIVE:  Vitals:   05/13/23 1934  BP: 121/81  Pulse: (!) 59  Resp: 16  Temp: 98.3 F (36.8 C)  TempSrc: Oral  SpO2: 97%     General appearance: alert; no distress HEENT: nasal congestion; clear runny nose; throat irritation secondary to post-nasal drainage; bilateral maxillary tenderness to palpation; turbinates boggy; very poor dentition Neck: supple without LAD; trachea midline Lungs: unlabored respirations, symmetrical air entry; cough: absent; no respiratory distress Skin: warm and dry Psychological: alert and cooperative; normal mood and affect  No Known Allergies  Past Medical History:  Diagnosis Date   Asthma    Celiac disease    Depression    Endometriosis    Family History  Problem Relation Age of Onset   Rheum arthritis Mother    Cancer Mother    Diabetes Father    Cancer Father    Social History   Socioeconomic History   Marital status: Single    Spouse name: Not on file   Number of children: Not on file   Years of education: Not on file   Highest education level: Not on file  Occupational History   Not on file  Tobacco Use   Smoking status: Every Day    Current packs/day: 1.00    Types: Cigarettes   Smokeless tobacco: Never   Tobacco comments:    Pt interested in quitting smoking  Vaping Use   Vaping status: Never Used  Substance and Sexual  Activity   Alcohol use: Never   Drug use: Yes    Types: Marijuana   Sexual activity: Yes    Birth control/protection: Condom  Other Topics Concern   Not on file  Social History Narrative   Not on file   Social Determinants of Health   Financial Resource Strain: Not on file  Food Insecurity: No Food Insecurity (10/07/2022)   Hunger Vital Sign    Worried About Running Out of Food in the Last Year: Never true    Ran Out of Food in the Last Year: Never true  Transportation Needs: No Transportation Needs (10/07/2022)   PRAPARE -  Administrator, Civil Service (Medical): No    Lack of Transportation (Non-Medical): No  Physical Activity: Not on file  Stress: Not on file  Social Connections: Not on file  Intimate Partner Violence: Not At Risk (10/07/2022)   Humiliation, Afraid, Rape, and Kick questionnaire    Fear of Current or Ex-Partner: No    Emotionally Abused: No    Physically Abused: No    Sexually Abused: No             Mardella Layman, MD 05/13/23 [phone]

## 2023-06-05 ENCOUNTER — Other Ambulatory Visit: Payer: Self-pay

## 2023-06-05 ENCOUNTER — Ambulatory Visit (HOSPITAL_COMMUNITY)
Admission: EM | Admit: 2023-06-05 | Discharge: 2023-06-05 | Disposition: A | Discharge status: Home / Self Care | Payer: 59 | Attending: Emergency Medicine | Admitting: Emergency Medicine

## 2023-06-05 ENCOUNTER — Emergency Department (HOSPITAL_COMMUNITY): Payer: 59

## 2023-06-05 ENCOUNTER — Encounter (HOSPITAL_COMMUNITY): Payer: Self-pay | Admitting: Emergency Medicine

## 2023-06-05 ENCOUNTER — Emergency Department (HOSPITAL_COMMUNITY)
Admission: EM | Admit: 2023-06-05 | Discharge: 2023-06-05 | Disposition: A | Discharge status: Home / Self Care | Payer: 59 | Attending: Emergency Medicine | Admitting: Emergency Medicine

## 2023-06-05 ENCOUNTER — Encounter (HOSPITAL_COMMUNITY): Payer: Self-pay | Admitting: *Deleted

## 2023-06-05 DIAGNOSIS — R0781 Pleurodynia: Secondary | ICD-10-CM | POA: Diagnosis not present

## 2023-06-05 DIAGNOSIS — Y92410 Unspecified street and highway as the place of occurrence of the external cause: Secondary | ICD-10-CM | POA: Insufficient documentation

## 2023-06-05 DIAGNOSIS — F17200 Nicotine dependence, unspecified, uncomplicated: Secondary | ICD-10-CM | POA: Insufficient documentation

## 2023-06-05 DIAGNOSIS — M542 Cervicalgia: Secondary | ICD-10-CM | POA: Insufficient documentation

## 2023-06-05 DIAGNOSIS — K047 Periapical abscess without sinus: Secondary | ICD-10-CM | POA: Diagnosis not present

## 2023-06-05 DIAGNOSIS — R220 Localized swelling, mass and lump, head: Secondary | ICD-10-CM | POA: Diagnosis not present

## 2023-06-05 LAB — I-STAT CHEM 8, ED
BUN: 8 mg/dL (ref 6–20)
Calcium, Ion: 1.11 mmol/L — ABNORMAL LOW (ref 1.15–1.40)
Chloride: 104 mmol/L (ref 98–111)
Creatinine, Ser: 0.6 mg/dL (ref 0.44–1.00)
Glucose, Bld: 96 mg/dL (ref 70–99)
HCT: 33 % — ABNORMAL LOW (ref 36.0–46.0)
Hemoglobin: 11.2 g/dL — ABNORMAL LOW (ref 12.0–15.0)
Potassium: 4.2 mmol/L (ref 3.5–5.1)
Sodium: 137 mmol/L (ref 135–145)
TCO2: 22 mmol/L (ref 22–32)

## 2023-06-05 LAB — CBC WITH DIFFERENTIAL/PLATELET
Abs Immature Granulocytes: 0.02 10*3/uL (ref 0.00–0.07)
Basophils Absolute: 0 10*3/uL (ref 0.0–0.1)
Basophils Relative: 0 %
Eosinophils Absolute: 0.2 10*3/uL (ref 0.0–0.5)
Eosinophils Relative: 2 %
HCT: 34.2 % — ABNORMAL LOW (ref 36.0–46.0)
Hemoglobin: 11 g/dL — ABNORMAL LOW (ref 12.0–15.0)
Immature Granulocytes: 0 %
Lymphocytes Relative: 32 %
Lymphs Abs: 3.3 10*3/uL (ref 0.7–4.0)
MCH: 29.4 pg (ref 26.0–34.0)
MCHC: 32.2 g/dL (ref 30.0–36.0)
MCV: 91.4 fL (ref 80.0–100.0)
Monocytes Absolute: 0.7 10*3/uL (ref 0.1–1.0)
Monocytes Relative: 7 %
Neutro Abs: 6.2 10*3/uL (ref 1.7–7.7)
Neutrophils Relative %: 59 %
Platelets: 247 10*3/uL (ref 150–400)
RBC: 3.74 MIL/uL — ABNORMAL LOW (ref 3.87–5.11)
RDW: 13.9 % (ref 11.5–15.5)
WBC: 10.4 10*3/uL (ref 4.0–10.5)
nRBC: 0 % (ref 0.0–0.2)

## 2023-06-05 LAB — COMPREHENSIVE METABOLIC PANEL
ALT: 12 U/L (ref 0–44)
AST: 14 U/L — ABNORMAL LOW (ref 15–41)
Albumin: 3.5 g/dL (ref 3.5–5.0)
Alkaline Phosphatase: 63 U/L (ref 38–126)
Anion gap: 9 (ref 5–15)
BUN: 8 mg/dL (ref 6–20)
CO2: 23 mmol/L (ref 22–32)
Calcium: 9 mg/dL (ref 8.9–10.3)
Chloride: 105 mmol/L (ref 98–111)
Creatinine, Ser: 0.67 mg/dL (ref 0.44–1.00)
GFR, Estimated: >60 mL/min (ref >60)
Glucose, Bld: 93 mg/dL (ref 70–99)
Potassium: 4 mmol/L (ref 3.5–5.1)
Sodium: 137 mmol/L (ref 135–145)
Total Bilirubin: 0.4 mg/dL (ref 0.3–1.2)
Total Protein: 6.6 g/dL (ref 6.5–8.1)

## 2023-06-05 LAB — HCG, SERUM, QUALITATIVE: Preg, Serum: NEGATIVE

## 2023-06-05 MED ORDER — HYDROCODONE-ACETAMINOPHEN 5-325 MG PO TABS
1.0000 | ORAL_TABLET | Freq: Once | ORAL | Status: AC
  Administered 2023-06-05: 1 via ORAL
  Filled 2023-06-05: qty 1

## 2023-06-05 MED ORDER — KETOROLAC TROMETHAMINE 15 MG/ML IJ SOLN
15.0000 mg | Freq: Once | INTRAMUSCULAR | Status: AC
  Administered 2023-06-05: 15 mg via INTRAVENOUS
  Filled 2023-06-05: qty 1

## 2023-06-05 MED ORDER — MORPHINE SULFATE (PF) 4 MG/ML IV SOLN: 4.0000 mg | Freq: Once | INTRAVENOUS | Status: DC

## 2023-06-05 MED ORDER — MORPHINE SULFATE (PF) 4 MG/ML IV SOLN
4.0000 mg | Freq: Once | INTRAVENOUS | Status: AC
  Administered 2023-06-05: 4 mg via INTRAVENOUS
  Filled 2023-06-05: qty 1

## 2023-06-05 MED ORDER — ONDANSETRON HCL 4 MG/2ML IJ SOLN
4.0000 mg | Freq: Once | INTRAMUSCULAR | Status: AC
  Administered 2023-06-05: 4 mg via INTRAVENOUS
  Filled 2023-06-05: qty 2

## 2023-06-05 MED ORDER — PENICILLIN V POTASSIUM 500 MG PO TABS: 500.0000 mg | ORAL_TABLET | Freq: Four times a day (QID) | ORAL | 0 refills | Status: AC

## 2023-06-05 MED ORDER — METHOCARBAMOL 500 MG PO TABS: 500.0000 mg | ORAL_TABLET | Freq: Two times a day (BID) | ORAL | 0 refills | Status: DC

## 2023-06-05 MED ORDER — IOHEXOL 350 MG/ML SOLN
75.0000 mL | Freq: Once | INTRAVENOUS | Status: AC | PRN
  Administered 2023-06-05: 75 mL via INTRAVENOUS
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
## 2023-06-05 NOTE — Discharge Instructions (Addendum)
Please follow up with your dental provider, or someone from resource list given or provider of your choice, take antibiotic as directed. Go to Er for new or worsening issues(unable to take fluids,unable to keep medication down, worsening pain/swelling). Avoid chewing gum, hard candy, crunching ice as it makes issues worse. Stop smoking.
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
## 2023-06-05 NOTE — ED Provider Notes (Signed)
[facility]    CSN: [id number] Arrival date & time: 06/05/23  1010      History   Chief Complaint Chief Complaint  Patient presents with   URI    HPI Rebecca Velasquez is a 37 y.o. female.   37 year old female, Rebecca Velasquez, presents to urgent care for evaluation of right sided facial pain and swelling. Pt worried she has a sinus infection. Pt endorses smoking. Pt does not have a dental provider and has not seen a dentist in years  The history is provided by the patient. No language interpreter was used.    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      Home Medications    Prior to Admission medications   Medication Sig Start Date End Date Taking? Authorizing Provider  penicillin v potassium (VEETID) 500 MG tablet Take 1 tablet (500 mg total) by mouth 4 (four) times daily for 10 days. 06/05/23 06/15/23 Yes Jacobs Golab, Para March, NP  albuterol (VENTOLIN HFA) 108 (90 Base) MCG/ACT inhaler INHALE 1-2 PUFFS BY MOUTH EVERY 6 HOURS AS NEEDED FOR WHEEZE OR SHORTNESS OF BREATH Patient taking differently: Inhale 2 puffs into the lungs every 6 (six) hours as needed for wheezing or shortness of breath. 03/02/22   Ardith Dark,  MD  ARIPiprazole (ABILIFY) 5 MG tablet Take 1 tablet (5 mg total) by mouth daily. 10/12/22 11/11/22  Massengill, Harrold Donath, MD  cyclobenzaprine (FLEXERIL) 10 MG tablet Take 1 tablet (10 mg total) by mouth 3 (three) times daily as needed for muscle spasms. Patient not taking: Reported on 06/05/2023 11/23/22   Ardith Dark, MD  esomeprazole (NEXIUM) 40 MG packet Take 40 mg by mouth daily before breakfast. Patient not taking: Reported on 06/05/2023 06/04/22   Ardith Dark, MD  gabapentin (NEURONTIN) 300 MG capsule Take 1 capsule (300 mg total) by mouth every 8 (eight) hours. 10/12/22 11/23/22  Massengill, Harrold Donath, MD  hydrOXYzine (ATARAX) 25 MG tablet Take 1 tablet (25 mg total) by mouth every 6 (six) hours as needed for anxiety. Patient not taking: Reported on 06/05/2023 10/12/22   Phineas Inches, MD  predniSONE (DELTASONE) 10 MG tablet 6-5-4-3-2-1-OFF Patient not taking: Reported on 06/05/2023 11/23/22   Ardith Dark, MD  sertraline (ZOLOFT) 50 MG tablet Take 1 tablet (50 mg total) by mouth daily. 10/12/22 11/23/22  Massengill, Harrold Donath, MD  traMADol (ULTRAM) 50 MG tablet Take 1 tablet (50 mg total) by mouth every 6 (six) hours as needed. Patient not taking: Reported on 06/05/2023 05/13/23   Mardella Layman, MD  traZODone (DESYREL) 50 MG tablet Take 1 tablet (50 mg total) by mouth at bedtime. 10/12/22 11/23/22  Phineas Inches, MD  QUEtiapine (  SEROQUEL) 200 MG tablet Take 200 mg by mouth at bedtime.  05/05/19  [provider]    Family History Family History  Problem Relation Age of Onset   Rheum arthritis Mother    Cancer Mother    Diabetes Father    Cancer Father     Social History Social History   Tobacco Use   Smoking status: Every Day    Current packs/day: 1.00    Types: Cigarettes   Smokeless tobacco: Never   Tobacco comments:    Pt interested in quitting smoking  Vaping Use   Vaping status: Never Used  Substance Use Topics   Alcohol use: Never   Drug use: Not Currently     Types: Marijuana     Allergies   Patient has no known allergies.   Review of Systems Review of Systems  Constitutional:  Negative for fever.  HENT:  Positive for dental problem, facial swelling, sinus pressure and sinus pain. Negative for postnasal drip.   All other systems reviewed and are negative.    Physical Exam Triage Vital Signs ED Triage Vitals  Encounter Vitals Group     BP 06/05/23 1136 116/82     Systolic BP Percentile --      Diastolic BP Percentile --      Pulse Rate 06/05/23 1136 66     Resp 06/05/23 1136 18     Temp 06/05/23 1136 98.1 F (36.7 C)     Temp Source 06/05/23 1136 Oral     SpO2 06/05/23 1136 98 %     Weight --      Height --      Head Circumference --      Peak Flow --      Pain Score 06/05/23 1133 9     Pain Loc --      Pain Education --      Exclude from Growth Chart --    No data found.  Updated Vital Signs BP 116/82 (BP Location: Left Arm)   Pulse 66   Temp 98.1 F (36.7 C) (Oral)   Resp 18   LMP 05/05/2023 (Approximate)   SpO2 98%   Visual Acuity Right Eye Distance:   Left Eye Distance:   Bilateral Distance:    Right Eye Near:   Left Eye Near:    Bilateral Near:     Physical Exam Vitals and nursing note reviewed.  Constitutional:      Appearance: She is well-developed and well-groomed.  HENT:     Head: Normocephalic.     Jaw: No trismus.     Mouth/Throat:     Dentition: Abnormal dentition. Gingival swelling and dental caries present.      Comments: Poor dentition throughout. Area of pain,swelling, no fluctunace, teeth eroded through gumline.  Cardiovascular:     Rate and Rhythm: Normal rate and regular rhythm.     Pulses: Normal pulses.     Heart sounds: Normal heart sounds.  Pulmonary:     Effort: Pulmonary effort is normal.     Breath sounds: Normal breath sounds and air entry.  Neurological:     General: No focal deficit present.     Mental Status: She is alert and oriented to person, place, and time.      GCS: GCS eye subscore is 4. GCS verbal subscore is 5. GCS motor subscore is 6.     Cranial Nerves: No cranial nerve deficit.     Sensory: No sensory deficit.  Psychiatric:  Attention and Perception: Attention normal.        Mood and Affect: Mood normal.        Speech: Speech normal.        Behavior: Behavior normal. Behavior is cooperative.      UC Treatments / Results  Labs (all labs ordered are listed, but only abnormal results are displayed) Labs Reviewed - No data to display  EKG   Radiology No results found.  Procedures Procedures (including critical care time)  Medications Ordered in UC Medications - No data to display  Initial Impression / Assessment and Plan / UC Course  I have reviewed the triage vital signs and the nursing notes.  Pertinent labs & imaging results that were available during my care of the patient were reviewed by me and considered in my medical decision making (see chart for details).    Discussed exam findings and plan of care with patient, strict go to ER precautions given.   Patient verbalized understanding to this provider.  Ddx: Dental infection,abscess, gingivitis, facial swelling Final Clinical Impressions(s) / UC Diagnoses   Final diagnoses:  Right facial swelling  Dental infection  Smoker     Discharge Instructions      Please follow up with your dental provider, or someone from resource list given or provider of your choice, take antibiotic as directed. Go to Er for new or worsening issues(unable to take fluids,unable to keep medication down, worsening pain/swelling). Avoid chewing gum, hard candy, crunching ice as it makes issues worse. Stop smoking.     ED Prescriptions     Medication Sig Dispense Auth. Provider   penicillin v potassium (VEETID) 500 MG tablet Take 1 tablet (500 mg total) by mouth 4 (four) times daily for 10 days. 40 tablet Gloria Lambertson, Para March, NP      PDMP not reviewed this encounter.    Clancy Gourd, NP 06/05/23 1241

## 2023-06-05 NOTE — ED Triage Notes (Addendum)
Allergy/sinus symptoms for a week.  Post nasal drip, runny nose, coughing and sneezing.  Last night started having facial pain, facial swelling.  Pain in jaw.  Denies dental pain.  Right side of face is swollen  Has had advil, tylenol, sudafed and flonase
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
## 2023-06-08 ENCOUNTER — Ambulatory Visit: Payer: 59 | Admitting: Family Medicine

## 2023-06-08 ENCOUNTER — Encounter: Payer: Self-pay | Admitting: Family Medicine

## 2023-06-08 VITALS — BP 137/86 | HR 65 | Temp 98.2°F | Ht 61.0 in | Wt 171.8 lb

## 2023-06-08 DIAGNOSIS — M542 Cervicalgia: Secondary | ICD-10-CM

## 2023-06-08 DIAGNOSIS — M25552 Pain in left hip: Secondary | ICD-10-CM | POA: Diagnosis not present

## 2023-06-08 DIAGNOSIS — M79602 Pain in left arm: Secondary | ICD-10-CM | POA: Diagnosis not present

## 2023-06-08 MED ORDER — HYDROCODONE-ACETAMINOPHEN 5-325 MG PO TABS: 1.0000 | ORAL_TABLET | Freq: Four times a day (QID) | ORAL | 0 refills | Status: DC | PRN

## 2023-06-08 MED ORDER — FLUCONAZOLE 150 MG PO TABS: 150.0000 mg | ORAL_TABLET | ORAL | 0 refills | Status: DC | PRN

## 2023-06-08 NOTE — Patient Instructions (Addendum)
It was very nice to see you today!  I will send in some pain medications for you.  Will also give you a letter for work today.  I will refer you to see the orthopedist.  Take care, Dr Jimmey Ralph  PLEASE NOTE:  If you had any lab tests, please let us know if you have not heard back within a few days. You may see your results on mychart before we have a chance to review them but we will give you a call once they are reviewed by [place].   If we ordered any referrals today, please let us know if you have not heard from their office within the next week.   If you had any urgent prescriptions sent in today, please check with the pharmacy within an hour of our visit to make sure the prescription was transmitted appropriately.   Please try these tips to maintain a healthy lifestyle:  Eat at least 3 REAL meals and 1-2 snacks per day.  Aim for no more than 5 hours between eating.  If you eat breakfast, please do so within one hour of getting up.   Each meal should contain half fruits/vegetables, one quarter protein, and one quarter carbs (no bigger than a computer mouse)  Cut down on sweet beverages. This includes juice, soda, and sweet tea.   Drink at least 1 glass of water with each meal and aim for at least 8 glasses per day  Exercise at least 150 minutes every week.
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
## 2023-06-15 ENCOUNTER — Other Ambulatory Visit: Payer: Self-pay | Admitting: Family Medicine

## 2023-06-16 MED ORDER — HYDROCODONE-ACETAMINOPHEN 5-325 MG PO TABS: 1.0000 | ORAL_TABLET | Freq: Four times a day (QID) | ORAL | 0 refills | Status: DC | PRN

## 2023-06-23 ENCOUNTER — Other Ambulatory Visit: Payer: Self-pay | Admitting: Family Medicine

## 2023-06-24 MED ORDER — HYDROCODONE-ACETAMINOPHEN 5-325 MG PO TABS: 1.0000 | ORAL_TABLET | Freq: Four times a day (QID) | ORAL | 0 refills | Status: DC | PRN

## 2023-07-01 ENCOUNTER — Other Ambulatory Visit: Payer: Self-pay | Admitting: Family Medicine

## 2023-07-02 MED ORDER — HYDROCODONE-ACETAMINOPHEN 5-325 MG PO TABS: 1.0000 | ORAL_TABLET | Freq: Four times a day (QID) | ORAL | 0 refills | Status: DC | PRN

## 2023-07-02 NOTE — Telephone Encounter (Signed)
Needs follow-up visit for further refills

## 2023-07-12 ENCOUNTER — Other Ambulatory Visit: Payer: Self-pay | Admitting: Family Medicine

## 2023-07-12 ENCOUNTER — Encounter: Payer: 59 | Admitting: Obstetrics and Gynecology

## 2023-07-21 ENCOUNTER — Encounter (HOSPITAL_COMMUNITY): Payer: Self-pay

## 2023-07-21 ENCOUNTER — Ambulatory Visit (HOSPITAL_COMMUNITY)
Admission: RE | Admit: 2023-07-21 | Discharge: 2023-07-21 | Disposition: A | Discharge status: Home / Self Care | Payer: 59 | Source: Ambulatory Visit | Attending: Family Medicine | Admitting: Family Medicine

## 2023-07-21 VITALS — BP 122/83 | HR 75 | Temp 98.8°F | Resp 18

## 2023-07-21 DIAGNOSIS — J4541 Moderate persistent asthma with (acute) exacerbation: Secondary | ICD-10-CM | POA: Diagnosis not present

## 2023-07-21 DIAGNOSIS — J069 Acute upper respiratory infection, unspecified: Secondary | ICD-10-CM

## 2023-07-21 MED ORDER — HYDROCODONE BIT-HOMATROP MBR 5-1.5 MG/5ML PO SOLN: 5.0000 mL | Freq: Four times a day (QID) | ORAL | 0 refills | Status: DC | PRN

## 2023-07-21 MED ORDER — PREDNISONE 50 MG PO TABS: ORAL_TABLET | ORAL | 0 refills | Status: DC

## 2023-07-21 NOTE — ED Triage Notes (Signed)
Pt c/o congested cough that has progressively worsened over the last week. States symptoms started with stuffy nose, post nasal drip, and cough. States she has had some wheezing as well. Hx of asthma. Has been using nebulizer and inhaler with some relief. DayQuil/NyQuil with little relief.

## 2023-07-22 NOTE — ED Provider Notes (Signed)
[facility]   [id number] 07/21/23 Arrival Time: 1828  ASSESSMENT & PLAN:  1. Viral URI with cough   2. Moderate persistent asthma with exacerbation    Without resp distress.  Meds ordered this encounter  Medications   predniSONE (DELTASONE) 50 MG tablet    Sig: Take one tablet by mouth for 5 days.    Dispense:  5 tablet    Refill:  0   HYDROcodone bit-homatropine (HYCODAN) 5-1.5 MG/5ML syrup    Sig: Take 5 mLs by mouth every 6 (six) hours as needed for cough.    Dispense:  90 mL    Refill:  0   Asthma precautions given. OTC symptom care as needed.  Recommend:  Follow-up Information     Ardith Dark, MD.   Specialty: Family Medicine Why: As needed. Contact information: [address] [place] [address] [phone]         [facility].   Specialty: Urgent Care Why: If worsening or failing to improve as anticipated. Contact information: [address] [place] [address] [phone]                Reviewed expectations re: course of current medical issues. Questions answered. Outlined signs and symptoms indicating need for more acute intervention. Patient verbalized understanding. After Visit Summary given.  SUBJECTIVE: History from: patient.  Rebecca Velasquez is a 37 y.o. female who presents with complaint of congested cough that has progressively worsened over the last week. States symptoms started with stuffy nose, post nasal drip, and cough. States she has had some wheezing as well. Hx of asthma. Has been using nebulizer and inhaler with some relief. DayQuil/NyQuil with little relief.    Social History   Tobacco Use  Smoking Status Every Day   Current packs/day: 1.00   Types: Cigarettes  Smokeless Tobacco Never  Tobacco Comments   Pt interested in quitting smoking      OBJECTIVE:  Vitals:   07/21/23 1842  BP: 122/83  Pulse: 75  Resp: 18  Temp: 98.8 F (37.1 C)   TempSrc: Oral  SpO2: 97%    General appearance: alert; NAD HEENT: [place]; AT; with mild nasal congestion Neck: supple without LAD Cv: RRR without murmer Lungs: unlabored respirations, moderate bilateral expiratory wheezing; cough: moderate; no significant respiratory distress Skin: warm and dry Psychological: alert and cooperative; normal mood and affect  Imaging: No results found.  No Known Allergies  Past Medical History:  Diagnosis Date   Asthma    Celiac disease    Depression    Endometriosis    Family History  Problem Relation Age of Onset   Rheum arthritis Mother    Cancer Mother    Diabetes Father    Cancer Father    Social History   Socioeconomic History   Marital status: Single    Spouse name: Not on file   Number of children: Not on file   Years of education: Not on file   Highest education level: Not on file  Occupational History   Not on file  Tobacco Use   Smoking status: Every Day    Current packs/day: 1.00    Types: Cigarettes   Smokeless tobacco: Never   Tobacco comments:    Pt interested in quitting smoking  Vaping Use   Vaping status: Never Used  Substance and Sexual Activity   Alcohol use: Never   Drug use: Not Currently    Types: Marijuana   Sexual  activity: Yes    Birth control/protection: Condom, None  Other Topics Concern   Not on file  Social History Narrative   Not on file   Social Determinants of Health   Financial Resource Strain: Not on file  Food Insecurity: No Food Insecurity (10/07/2022)   Hunger Vital Sign    Worried About Running Out of Food in the Last Year: Never true    Ran Out of Food in the Last Year: Never true  Transportation Needs: No Transportation Needs (10/07/2022)   PRAPARE - Administrator, Civil Service (Medical): No    Lack of Transportation (Non-Medical): No  Physical Activity: Not on file  Stress: Not on file  Social Connections: Not on file  Intimate Partner Violence: Not At Risk  (10/07/2022)   Humiliation, Afraid, Rape, and Kick questionnaire    Fear of Current or Ex-Partner: No    Emotionally Abused: No    Physically Abused: No    Sexually Abused: No             Mardella Layman, MD 07/22/23 1100

## 2023-07-29 ENCOUNTER — Ambulatory Visit (HOSPITAL_COMMUNITY)
Admission: RE | Admit: 2023-07-29 | Discharge: 2023-07-29 | Disposition: A | Discharge status: Home / Self Care | Payer: 59 | Source: Ambulatory Visit | Attending: Family Medicine | Admitting: Family Medicine

## 2023-07-29 ENCOUNTER — Encounter (HOSPITAL_COMMUNITY): Payer: Self-pay

## 2023-07-29 ENCOUNTER — Ambulatory Visit (HOSPITAL_COMMUNITY): Payer: 59

## 2023-07-29 VITALS — BP 125/81 | HR 62 | Temp 98.2°F | Resp 18

## 2023-07-29 DIAGNOSIS — J0191 Acute recurrent sinusitis, unspecified: Secondary | ICD-10-CM | POA: Diagnosis not present

## 2023-07-29 DIAGNOSIS — J019 Acute sinusitis, unspecified: Secondary | ICD-10-CM | POA: Diagnosis not present

## 2023-07-29 MED ORDER — BENZONATATE 100 MG PO CAPS: 100.0000 mg | ORAL_CAPSULE | Freq: Three times a day (TID) | ORAL | 0 refills | Status: DC | PRN

## 2023-07-29 MED ORDER — DOXYCYCLINE HYCLATE 100 MG PO CAPS: 100.0000 mg | ORAL_CAPSULE | Freq: Two times a day (BID) | ORAL | 0 refills | Status: AC

## 2023-07-29 MED ORDER — FLUCONAZOLE 150 MG PO TABS: 150.0000 mg | ORAL_TABLET | ORAL | 0 refills | Status: AC

## 2023-07-29 MED ORDER — METHYLPREDNISOLONE ACETATE 80 MG/ML IJ SUSP
80.0000 mg | Freq: Once | INTRAMUSCULAR | Status: AC
  Administered 2023-07-29: 80 mg via INTRAMUSCULAR

## 2023-07-29 MED ORDER — METHYLPREDNISOLONE ACETATE 80 MG/ML IJ SUSP
INTRAMUSCULAR | Status: AC
  Filled 2023-07-29: qty 1

## 2023-07-29 NOTE — ED Provider Notes (Addendum)
[facility]    CSN: [id number] Arrival date & time: 07/29/23  1523      History   Chief Complaint Chief Complaint  Patient presents with   Cough    "Runny nose, sore throat" - Entered by patient PTA     HPI Rebecca Velasquez is a 37 y.o. female.    Cough Here for cough and congestion and wheezing that is been going on for about 2 weeks.  No fever at any point.  She was seen here October 23 and prescribed prednisone for asthma exacerbation.  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      Home Medications    Prior to Admission medications   Medication Sig Start Date End Date Taking? Authorizing Provider  benzonatate (TESSALON) 100 MG capsule Take 1 capsule (100 mg total) by mouth 3 (three) times daily as needed for cough. 07/29/23  Yes Niva Murren, Janace Aris, MD  clonazePAM (KLONOPIN) 0.5 MG tablet Take 0.5 mg by mouth every 8 (eight) hours as needed. 07/05/23  Yes [provider]  doxycycline (VIBRAMYCIN) 100 MG capsule Take 1 capsule (100  mg total) by mouth 2 (two) times daily for 7 days. 07/29/23 08/05/23 Yes Zenia Resides, MD  fluconazole (DIFLUCAN) 150 MG tablet Take 1 tablet (150 mg total) by mouth every 3 (three) days for 2 doses. 07/29/23 08/02/23 Yes Palma Buster, Janace Aris, MD  ADDERALL 20 MG tablet 1 tablet Orally Twice a day    [provider]  ARIPiprazole (ABILIFY) 5 MG tablet Take 1 tablet (5 mg total) by mouth daily. 10/12/22 06/08/23  Massengill, Harrold Donath, MD  gabapentin (NEURONTIN) 300 MG capsule Take 1 capsule (300 mg total) by mouth every 8 (eight) hours. 10/12/22 11/23/22  Massengill, Harrold Donath, MD  sertraline (ZOLOFT) 50 MG tablet Take 1 tablet (50 mg total) by mouth daily. 10/12/22 06/08/23  Massengill, Harrold Donath, MD  traZODone (DESYREL) 50 MG tablet Take 1 tablet (50 mg total) by mouth at bedtime. 10/12/22 06/08/23  Massengill, Harrold Donath, MD  QUEtiapine (SEROQUEL) 200 MG tablet Take 200 mg by mouth at bedtime.  05/05/19  [provider]    Family History Family History  Problem Relation Age of Onset   Rheum arthritis Mother    Cancer Mother    Diabetes Father    Cancer Father     Social History Social History   Tobacco Use   Smoking status: Every Day    Current packs/day: 1.00    Types: Cigarettes   Smokeless  tobacco: Never   Tobacco comments:    Pt interested in quitting smoking  Vaping Use   Vaping status: Never Used  Substance Use Topics   Alcohol use: Never   Drug use: Not Currently    Types: Marijuana     Allergies   Patient has no known allergies.   Review of Systems Review of Systems  Respiratory:  Positive for cough.      Physical Exam Triage Vital Signs ED Triage Vitals  Encounter Vitals Group     BP 07/29/23 1547 125/81     Systolic BP Percentile --      Diastolic BP Percentile --      Pulse Rate 07/29/23 1547 62     Resp 07/29/23 1547 18     Temp 07/29/23 1547 98.2 F (36.8 C)     Temp Source 07/29/23 1547 Oral     SpO2 07/29/23 1547 96 %     Weight --       Height --      Head Circumference --      Peak Flow --      Pain Score 07/29/23 1549 6     Pain Loc --      Pain Education --      Exclude from Growth Chart --    No data found.  Updated Vital Signs BP 125/81 (BP Location: Right Arm)   Pulse 62   Temp 98.2 F (36.8 C) (Oral)   Resp 18   LMP 07/07/2023 (Approximate)   SpO2 96%   Visual Acuity Right Eye Distance:   Left Eye Distance:   Bilateral Distance:    Right Eye Near:   Left Eye Near:    Bilateral Near:     Physical Exam Vitals reviewed.  Constitutional:      General: She is not in acute distress.    Appearance: She is not ill-appearing, toxic-appearing or diaphoretic.  HENT:     Right Ear: Tympanic membrane and ear canal normal.     Left Ear: Tympanic membrane and ear canal normal.     Nose: Congestion present.     Mouth/Throat:     Mouth: Mucous membranes are moist.     Pharynx: No oropharyngeal exudate or posterior oropharyngeal erythema.  Eyes:     Extraocular Movements: Extraocular movements intact.     Conjunctiva/sclera: Conjunctivae normal.     Pupils: Pupils are equal, round, and reactive to light.  Cardiovascular:     Rate and Rhythm: Normal rate and regular rhythm.     Heart sounds: No murmur heard. Pulmonary:     Effort: No respiratory distress.     Breath sounds: No stridor. No rhonchi or rales.     Comments: Bilaterally breath sounds are coarse and expiratory phase is heard well.  There is a scant wheeze in the right lower posterior lung field. Chest:     Chest wall: No tenderness.  Musculoskeletal:     Cervical back: Neck supple.  Lymphadenopathy:     Cervical: No cervical adenopathy.  Skin:    Capillary Refill: Capillary refill takes less than 2 seconds.     Coloration: Skin is not jaundiced or pale.  Neurological:     General: No focal deficit present.     Mental Status: She is alert and oriented to person, place, and time.  Psychiatric:        Behavior: Behavior normal.       UC Treatments / Results  Labs (all labs ordered  are listed, but only abnormal results are displayed) Labs Reviewed - No data to display  EKG   Radiology No results found.  Procedures Procedures (including critical care time)  Medications Ordered in UC Medications  methylPREDNISolone acetate (DEPO-MEDROL) injection 80 mg (80 mg Intramuscular Given 07/29/23 1705)    Initial Impression / Assessment and Plan / UC Course  I have reviewed the triage vital signs and the nursing notes.  Pertinent labs & imaging results that were available during my care of the patient were reviewed by me and considered in my medical decision making (see chart for details).    By my review there is not an infiltrate or fluid.  She is advised of radiology overread  Doxycycline is sent in for acute sinusitis and Tessalon Perles are sent in for the cough.  She is given a injection of Depo-Medrol.  Patient requested Diflucan as she gets yeast infections with antibiotics.  Prescription for fluconazole sent  Final Clinical Impressions(s) / UC Diagnoses   Final diagnoses:  Acute sinusitis, recurrence not specified, unspecified location     Discharge Instructions      Your chest x-ray does not show any new infiltrate or fluid compared to a prior x-ray, so it does not look like there is a pneumonia.The radiologist will also read your x-ray, and if their interpretation differs significantly from mine, we will call you.  Take doxycycline 100 mg --1 capsule 2 times daily for 7 days; this is the antibiotic which would also be good for pneumonia if that were going on.  Take benzonatate 100 mg, 1 tab every 8 hours as needed for cough.       ED Prescriptions     Medication Sig Dispense Auth. Provider   doxycycline (VIBRAMYCIN) 100 MG capsule Take 1 capsule (100 mg total) by mouth 2 (two) times daily for 7 days. 14 capsule Carrington Olazabal, Janace Aris, MD   benzonatate (TESSALON) 100 MG capsule Take 1  capsule (100 mg total) by mouth 3 (three) times daily as needed for cough. 21 capsule Zenia Resides, MD   fluconazole (DIFLUCAN) 150 MG tablet Take 1 tablet (150 mg total) by mouth every 3 (three) days for 2 doses. 2 tablet Marlinda Mike Janace Aris, MD      PDMP not reviewed this encounter.   Zenia Resides, MD 07/29/23 1656    Zenia Resides, MD 07/29/23 [phone]

## 2023-07-29 NOTE — Discharge Instructions (Signed)
Your chest x-ray does not show any new infiltrate or fluid compared to a prior x-ray, so it does not look like there is a pneumonia.The radiologist will also read your x-ray, and if their interpretation differs significantly from mine, we will call you.  Take doxycycline 100 mg --1 capsule 2 times daily for 7 days; this is the antibiotic which would also be good for pneumonia if that were going on.  Take benzonatate 100 mg, 1 tab every 8 hours as needed for cough.

## 2023-07-29 NOTE — ED Triage Notes (Signed)
Pt presents with shortness of breath, cough, congestion, "on and off sore throat" x "more than 10 days." Pt states she was seen here on 10/23, gave prednisone and cough syrup however no improvement in symptoms. Pt also taking Dayquil / Nyquil, last dose of Dayquil @ 0830 this AM.

## 2024-07-05 ENCOUNTER — Ambulatory Visit (HOSPITAL_COMMUNITY): Payer: Self-pay

## 2024-07-06 ENCOUNTER — Ambulatory Visit (HOSPITAL_COMMUNITY)
Admission: EM | Admit: 2024-07-06 | Discharge: 2024-07-06 | Disposition: A | Discharge status: Home / Self Care | Payer: Self-pay

## 2024-07-06 ENCOUNTER — Encounter (HOSPITAL_COMMUNITY): Payer: Self-pay

## 2024-07-06 DIAGNOSIS — M25551 Pain in right hip: Secondary | ICD-10-CM

## 2024-07-06 MED ORDER — DEXAMETHASONE SOD PHOSPHATE PF 10 MG/ML IJ SOLN
10.0000 mg | Freq: Once | INTRAMUSCULAR | Status: AC
  Administered 2024-07-06: 10 mg via INTRAMUSCULAR

## 2024-07-06 MED ORDER — PREDNISONE 20 MG PO TABS: 40.0000 mg | ORAL_TABLET | Freq: Every day | ORAL | 0 refills | Status: AC

## 2024-07-06 NOTE — ED Provider Notes (Signed)
 [facility]    CSN: [id number] Arrival date & time: 07/06/24  1849      History   Chief Complaint Chief Complaint  Patient presents with   Hip Pain    HPI Armina Galloway is a 38 y.o. female.   Patient presents with right hip pain that began about 5 days ago and has progressively worsened since then.  Patient denies any recent falls or injuries.  Patient denies any history of hip pain.  Patient denies pain that radiates down her leg.  Patient denies numbness, weakness, or tingling down her leg.  Patient denies saddle anesthesia or bowel/bladder incontinence.  Patient reports that she has been taking ibuprofen with minimal relief.  The history is provided by the patient and medical records.  Hip Pain    Past Medical History:  Diagnosis Date   Asthma    Celiac disease    Depression    Endometriosis     Patient Active Problem List   Diagnosis Date Noted   Right facial swelling 06/05/2023   Dental infection 06/05/2023   Major depressive disorder, recurrent severe without psychotic features (HCC) 10/07/2022   Hidradenitis 04/29/2022   Asthma 12/03/2021   GERD (gastroesophageal reflux disease) 12/03/2021   Endometriosis 12/03/2021   Morbid obesity (HCC) 12/03/2021   S/P gastric bypass 12/03/2021   Smoker 12/03/2021   Generalized anxiety disorder 07/08/2020   Mild episode of recurrent major depressive disorder 07/08/2020    Past Surgical History:  Procedure Laterality Date   GASTRIC BYPASS OPEN  2016   IVC FILTER INSERTION  2016    OB History   No obstetric history on file.      Home Medications    Prior to Admission medications   Medication Sig Start Date End Date Taking? Authorizing Provider  ADDERALL 20 MG tablet 1 tablet Orally Twice a day   Yes [provider]  ARIPiprazole  (ABILIFY ) 10 MG tablet Take 10 mg by mouth daily.   Yes [provider]  clonazePAM  (KLONOPIN ) 0.5 MG tablet Take 0.5 mg by mouth every 8 (eight) hours  as needed. 07/05/23  Yes [provider]  gabapentin  (NEURONTIN ) 300 MG capsule Take 1 capsule (300 mg total) by mouth every 8 (eight) hours. 10/12/22 07/06/24 Yes Massengill, Rankin, MD  predniSONE  (DELTASONE ) 20 MG tablet Take 2 tablets (40 mg total) by mouth daily for 5 days. 07/06/24 07/11/24 Yes Perrie Ragin A, NP  QUEtiapine (SEROQUEL) 25 MG tablet Take 25 mg by mouth at bedtime. 06/01/24  Yes [provider]    Family History Family History  Problem Relation Age of Onset   Rheum arthritis Mother    Cancer Mother    Diabetes Father    Cancer Father     Social History Social History   Tobacco Use   Smoking status: Every Day    Current packs/day: 1.00    Types: Cigarettes   Smokeless tobacco: Never   Tobacco comments:    Pt interested in quitting smoking  Vaping Use   Vaping status: Never Used  Substance Use Topics   Alcohol use: Never   Drug use: Not Currently    Types: Marijuana     Allergies   Patient has no known allergies.   Review of Systems Review of Systems  Per HPI  Physical Exam Triage Vital Signs ED Triage Vitals  Encounter Vitals Group     BP 07/06/24 1914 120/74     Girls Systolic BP Percentile --  Girls Diastolic BP Percentile --      Boys Systolic BP Percentile --      Boys Diastolic BP Percentile --      Pulse Rate 07/06/24 1914 75     Resp 07/06/24 1914 18     Temp 07/06/24 1914 99 F (37.2 C)     Temp Source 07/06/24 1914 Oral     SpO2 07/06/24 1914 97 %     Weight 07/06/24 1914 210 lb (95.3 kg)     Height 07/06/24 1914 5' 1 (1.549 m)     Head Circumference --      Peak Flow --      Pain Score 07/06/24 1912 8     Pain Loc --      Pain Education --      Exclude from Growth Chart --    No data found.  Updated Vital Signs BP 120/74 (BP Location: Right Arm)   Pulse 75   Temp 99 F (37.2 C) (Oral)   Resp 18   Ht 5' 1 (1.549 m)   Wt 210 lb (95.3 kg)   LMP 06/13/2024 (Approximate)   SpO2 97%   BMI  39.68 kg/m   Visual Acuity Right Eye Distance:   Left Eye Distance:   Bilateral Distance:    Right Eye Near:   Left Eye Near:    Bilateral Near:     Physical Exam Vitals and nursing note reviewed.  Constitutional:      General: She is awake. She is not in acute distress.    Appearance: Normal appearance. She is well-developed and well-groomed. She is not ill-appearing.  Musculoskeletal:     Right hip: Tenderness present. No deformity, bony tenderness or crepitus. Normal range of motion. Normal strength.     Comments: Muscular tenderness noted just below the right iliac crest  Skin:    General: Skin is warm and dry.  Neurological:     Mental Status: She is alert.  Psychiatric:        Behavior: Behavior is cooperative.      UC Treatments / Results  Labs (all labs ordered are listed, but only abnormal results are displayed) Labs Reviewed - No data to display  EKG   Radiology No results found.  Procedures Procedures (including critical care time)  Medications Ordered in UC Medications  dexamethasone  (DECADRON ) injection 10 mg (has no administration in time range)    Initial Impression / Assessment and Plan / UC Course  I have reviewed the triage vital signs and the nursing notes.  Pertinent labs & imaging results that were available during my care of the patient were reviewed by me and considered in my medical decision making (see chart for details).     Patient is overall well-appearing.  Vitals are stable.  Pain likely muscular in nature.  Given IM Decadron  in clinic for acute pain.  Prescribed prednisone  burst for additional relief.  Recommended Tylenol  as needed for pain.  Given orthopedic follow-up.  Discussed follow-up and return precautions. Final Clinical Impressions(s) / UC Diagnoses   Final diagnoses:  Right hip pain     Discharge Instructions      You were given an injection of Decadron  in clinic today which is a steroid to help with  inflammation related to your pain. Tomorrow start taking 2 tablets of prednisone  once daily for 5 days for additional relief of this. While you are taking the prednisone  you can take 500 to 1000 mg of Tylenol  every 6-8  hours as needed for pain. Once you finish the prednisone  you can alternate between ibuprofen and Tylenol  if needed. Alternate between ice and heat and do some gentle stretching to help with pain. You can follow-up with [facility] if your pain continues for further evaluation. Otherwise follow-up with your primary care provider or return here as needed.   ED Prescriptions     Medication Sig Dispense Auth. Provider   predniSONE  (DELTASONE ) 20 MG tablet Take 2 tablets (40 mg total) by mouth daily for 5 days. 10 tablet Johnie Flaming A, NP      PDMP not reviewed this encounter.   Johnie Flaming A, NP 07/06/24 1958

## 2024-07-06 NOTE — ED Triage Notes (Signed)
 Right side hip pain onset 5 days ago and getting worse. Patient denies any recent falls or injuries. Denies any history of hip problems.   Patient tried ibuprofen with little relief.

## 2024-07-06 NOTE — Discharge Instructions (Addendum)
 You were given an injection of Decadron  in clinic today which is a steroid to help with inflammation related to your pain. Tomorrow start taking 2 tablets of prednisone  once daily for 5 days for additional relief of this. While you are taking the prednisone  you can take 500 to 1000 mg of Tylenol  every 6-8 hours as needed for pain. Once you finish the prednisone  you can alternate between ibuprofen and Tylenol  if needed. Alternate between ice and heat and do some gentle stretching to help with pain. You can follow-up with [facility] if your pain continues for further evaluation. Otherwise follow-up with your primary care provider or return here as needed.

## 2024-10-04 ENCOUNTER — Ambulatory Visit (HOSPITAL_COMMUNITY): Payer: Self-pay

## 2024-10-05 ENCOUNTER — Ambulatory Visit (HOSPITAL_COMMUNITY)
Admission: RE | Admit: 2024-10-05 | Discharge: 2024-10-05 | Disposition: A | Discharge status: Home / Self Care | Payer: Self-pay | Source: Ambulatory Visit | Attending: Family Medicine | Admitting: Family Medicine

## 2024-10-05 ENCOUNTER — Encounter (HOSPITAL_COMMUNITY): Payer: Self-pay

## 2024-10-05 VITALS — BP 124/85 | HR 69 | Temp 98.6°F | Resp 16

## 2024-10-05 DIAGNOSIS — S46002A Unspecified injury of muscle(s) and tendon(s) of the rotator cuff of left shoulder, initial encounter: Secondary | ICD-10-CM

## 2024-10-05 MED ORDER — KETOROLAC TROMETHAMINE 30 MG/ML IJ SOLN
30.0000 mg | Freq: Once | INTRAMUSCULAR | Status: AC
  Administered 2024-10-05: 30 mg via INTRAMUSCULAR

## 2024-10-05 MED ORDER — DEXAMETHASONE SOD PHOSPHATE PF 10 MG/ML IJ SOLN
10.0000 mg | Freq: Once | INTRAMUSCULAR | Status: AC
  Administered 2024-10-05: 10 mg via INTRAMUSCULAR

## 2024-10-05 MED ORDER — TRAMADOL HCL 50 MG PO TABS: 50.0000 mg | ORAL_TABLET | Freq: Four times a day (QID) | ORAL | 0 refills | Status: AC | PRN

## 2024-10-05 MED ORDER — KETOROLAC TROMETHAMINE 30 MG/ML IJ SOLN
INTRAMUSCULAR | Status: AC
  Filled 2024-10-05: qty 1

## 2024-10-05 MED ORDER — DEXAMETHASONE SOD PHOSPHATE PF 10 MG/ML IJ SOLN
INTRAMUSCULAR | Status: AC
  Filled 2024-10-05: qty 1
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
## 2024-10-05 NOTE — Discharge Instructions (Addendum)
 You have an injury to your rotator cuff, these of the muscles that control the movement of your shoulder. Your supraspinatus is likely inflamed or partially torn. You received Toradol  and Decadron  today for immediate pain relief. I recommend you see a sports medicine provider soon as possible for further evaluation likely with ultrasound. They will also potentially give you at shoulder injection and set you up with physical therapy. In the meantime I have sent a pain medicine to your pharmacy called tramadol .  You can take this up to 4 times daily for the next 5 days. I have listed a sports medicine provider below that you can call to schedule.

## 2024-10-20 ENCOUNTER — Other Ambulatory Visit: Payer: Self-pay

## 2024-10-20 ENCOUNTER — Emergency Department (HOSPITAL_BASED_OUTPATIENT_CLINIC_OR_DEPARTMENT_OTHER)
Admission: EM | Admit: 2024-10-20 | Discharge: 2024-10-20 | Disposition: A | Discharge status: Home / Self Care | Payer: Self-pay | Attending: Emergency Medicine | Admitting: Emergency Medicine

## 2024-10-20 ENCOUNTER — Encounter (HOSPITAL_BASED_OUTPATIENT_CLINIC_OR_DEPARTMENT_OTHER): Payer: Self-pay | Admitting: Emergency Medicine

## 2024-10-20 ENCOUNTER — Emergency Department (HOSPITAL_BASED_OUTPATIENT_CLINIC_OR_DEPARTMENT_OTHER): Payer: Self-pay | Admitting: Radiology

## 2024-10-20 DIAGNOSIS — J45909 Unspecified asthma, uncomplicated: Secondary | ICD-10-CM | POA: Insufficient documentation

## 2024-10-20 DIAGNOSIS — M25512 Pain in left shoulder: Secondary | ICD-10-CM | POA: Insufficient documentation

## 2024-10-20 DIAGNOSIS — J101 Influenza due to other identified influenza virus with other respiratory manifestations: Secondary | ICD-10-CM | POA: Insufficient documentation

## 2024-10-20 DIAGNOSIS — F17200 Nicotine dependence, unspecified, uncomplicated: Secondary | ICD-10-CM | POA: Insufficient documentation

## 2024-10-20 LAB — PREGNANCY, URINE: Preg Test, Ur: NEGATIVE

## 2024-10-20 LAB — CBC WITH DIFFERENTIAL/PLATELET
Abs Immature Granulocytes: 0.02 K/uL (ref 0.00–0.07)
Basophils Absolute: 0 K/uL (ref 0.0–0.1)
Basophils Relative: 0 %
Eosinophils Absolute: 0.1 K/uL (ref 0.0–0.5)
Eosinophils Relative: 3 %
HCT: 32.9 % — ABNORMAL LOW (ref 36.0–46.0)
Hemoglobin: 10.4 g/dL — ABNORMAL LOW (ref 12.0–15.0)
Immature Granulocytes: 0 %
Lymphocytes Relative: 32 %
Lymphs Abs: 1.5 K/uL (ref 0.7–4.0)
MCH: 25.1 pg — ABNORMAL LOW (ref 26.0–34.0)
MCHC: 31.6 g/dL (ref 30.0–36.0)
MCV: 79.3 fL — ABNORMAL LOW (ref 80.0–100.0)
Monocytes Absolute: 0.5 K/uL (ref 0.1–1.0)
Monocytes Relative: 10 %
Neutro Abs: 2.6 K/uL (ref 1.7–7.7)
Neutrophils Relative %: 55 %
Platelets: 255 K/uL (ref 150–400)
RBC: 4.15 MIL/uL (ref 3.87–5.11)
RDW: 17.2 % — ABNORMAL HIGH (ref 11.5–15.5)
WBC: 4.8 K/uL (ref 4.0–10.5)
nRBC: 0 % (ref 0.0–0.2)

## 2024-10-20 LAB — RESP PANEL BY RT-PCR (RSV, FLU A&B, COVID)  RVPGX2
Influenza A by PCR: POSITIVE — AB
Influenza B by PCR: NEGATIVE
Resp Syncytial Virus by PCR: NEGATIVE
SARS Coronavirus 2 by RT PCR: NEGATIVE

## 2024-10-20 LAB — BASIC METABOLIC PANEL WITH GFR
Anion gap: 13 (ref 5–15)
BUN: <5 mg/dL — ABNORMAL LOW (ref 6–20)
CO2: 24 mmol/L (ref 22–32)
Calcium: 9.6 mg/dL (ref 8.9–10.3)
Chloride: 101 mmol/L (ref 98–111)
Creatinine, Ser: 0.66 mg/dL (ref 0.44–1.00)
GFR, Estimated: >60 mL/min
Glucose, Bld: 88 mg/dL (ref 70–99)
Potassium: 3.9 mmol/L (ref 3.5–5.1)
Sodium: 138 mmol/L (ref 135–145)

## 2024-10-20 MED ORDER — LACTATED RINGERS IV BOLUS
1000.0000 mL | Freq: Once | INTRAVENOUS | Status: AC
  Administered 2024-10-20: 1000 mL via INTRAVENOUS

## 2024-10-20 MED ORDER — MAGNESIUM SULFATE 2 GM/50ML IV SOLN
2.0000 g | Freq: Once | INTRAVENOUS | Status: AC
  Administered 2024-10-20: 2 g via INTRAVENOUS
  Filled 2024-10-20: qty 50

## 2024-10-20 MED ORDER — METHYLPREDNISOLONE SODIUM SUCC 125 MG IJ SOLR
125.0000 mg | Freq: Once | INTRAMUSCULAR | Status: AC
  Administered 2024-10-20: 125 mg via INTRAVENOUS
  Filled 2024-10-20: qty 2

## 2024-10-20 MED ORDER — ONDANSETRON HCL 4 MG/2ML IJ SOLN
4.0000 mg | Freq: Once | INTRAMUSCULAR | Status: AC
  Administered 2024-10-20: 4 mg via INTRAVENOUS
  Filled 2024-10-20: qty 2

## 2024-10-20 MED ORDER — PREDNISONE 10 MG (21) PO TBPK: ORAL_TABLET | Freq: Every day | ORAL | 0 refills | Status: AC

## 2024-10-20 MED ORDER — MORPHINE SULFATE (PF) 4 MG/ML IV SOLN
4.0000 mg | Freq: Once | INTRAVENOUS | Status: AC
  Administered 2024-10-20: 4 mg via INTRAVENOUS
  Filled 2024-10-20: qty 1

## 2024-10-20 MED ORDER — IPRATROPIUM-ALBUTEROL 0.5-2.5 (3) MG/3ML IN SOLN
3.0000 mL | Freq: Once | RESPIRATORY_TRACT | Status: AC
  Administered 2024-10-20: 3 mL via RESPIRATORY_TRACT
  Filled 2024-10-20: qty 3

## 2024-10-20 MED ORDER — TRAMADOL HCL 50 MG PO TABS: 50.0000 mg | ORAL_TABLET | Freq: Four times a day (QID) | ORAL | 0 refills | Status: AC | PRN

## 2024-10-20 NOTE — Discharge Instructions (Addendum)
 It was a pleasure taking care of you today. You were seen in the Emergency Department for evaluation of shoulder pain and shortness of breath. Your work-up was reassuring. Your CT/Xray/Labs showed showed no evidence of pneumonia or bronchitis.  However you did test positive for the flu.  The flu is a virus so antibiotics are not indicated.  You may alternate between Tylenol  and ibuprofen every 3 hours as needed for body aches and fever.  I do suspect you also likely had an acute asthma exacerbation, which did improve with medication in the emergency department.  I am sending you home with a Medrol  Dosepak as part of your treatment for your asthma exacerbation.  I am going to send you a prescription for tramadol , for your shoulder pain.  Please only take this medication at night as it can cause drowsiness.  The Tylenol /ibuprofen during the day should help to ease your shoulder pain as well. Refer to the attached documentation for further management of your symptoms. Follow up with sports medicine if your symptoms continue.  Please return to the ER if you experience chest pain, trouble breathing, intractable nausea/vomiting or any other life threatening illnesses.
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
## 2024-10-20 NOTE — ED Notes (Signed)
 Pt d/c instructions, medications, and follow-up care reviewed with pt. Pt verbalized understanding and had no further questions at time of d/c. Pt CA&Ox4, ambulatory, and in NAD at time of d/c. Pt discharged with family.

## 2024-10-20 NOTE — ED Triage Notes (Signed)
 Left shoulder pain x 2 weeks, old shoulder injury from last year  Cough/ cold symptoms x 1-2 weeks
